# Patient Record
Sex: Female | Born: 1975 | Race: White | Hispanic: Yes | Marital: Married | Smoking: Never smoker
Health system: Southern US, Community
[De-identification: ages and names within clinical notes are randomized; demographics above are authoritative.]

## PROBLEM LIST (undated history)

## (undated) DIAGNOSIS — I10 Essential (primary) hypertension: Secondary | ICD-10-CM

## (undated) DIAGNOSIS — G4733 Obstructive sleep apnea (adult) (pediatric): Secondary | ICD-10-CM

## (undated) DIAGNOSIS — K219 Gastro-esophageal reflux disease without esophagitis: Secondary | ICD-10-CM

## (undated) DIAGNOSIS — K811 Chronic cholecystitis: Secondary | ICD-10-CM

## (undated) DIAGNOSIS — R102 Pelvic and perineal pain: Secondary | ICD-10-CM

## (undated) DIAGNOSIS — R112 Nausea with vomiting, unspecified: Secondary | ICD-10-CM

## (undated) DIAGNOSIS — Z87442 Personal history of urinary calculi: Secondary | ICD-10-CM

## (undated) DIAGNOSIS — F419 Anxiety disorder, unspecified: Secondary | ICD-10-CM

## (undated) DIAGNOSIS — Z9889 Other specified postprocedural states: Secondary | ICD-10-CM

## (undated) DIAGNOSIS — Z973 Presence of spectacles and contact lenses: Secondary | ICD-10-CM

## (undated) DIAGNOSIS — R519 Headache, unspecified: Secondary | ICD-10-CM

## (undated) DIAGNOSIS — B009 Herpesviral infection, unspecified: Secondary | ICD-10-CM

## (undated) HISTORY — DX: Herpesviral infection, unspecified: B00.9

## (undated) HISTORY — PX: ESSURE TUBAL LIGATION: SUR464

## (undated) HISTORY — PX: WISDOM TOOTH EXTRACTION: SHX21

## (undated) HISTORY — PX: DILATION AND CURETTAGE OF UTERUS: SHX78

## (undated) HISTORY — PX: BREAST CYST EXCISION: SHX579

---

## 2001-09-03 ENCOUNTER — Ambulatory Visit (HOSPITAL_COMMUNITY): Admission: RE | Admit: 2001-09-03 | Discharge: 2001-09-03 | Payer: Self-pay | Admitting: Obstetrics and Gynecology

## 2001-09-03 ENCOUNTER — Encounter: Payer: Self-pay | Admitting: Obstetrics and Gynecology

## 2001-10-31 ENCOUNTER — Inpatient Hospital Stay (HOSPITAL_COMMUNITY): Admission: AD | Admit: 2001-10-31 | Discharge: 2001-10-31 | Payer: Self-pay | Admitting: Obstetrics and Gynecology

## 2002-01-19 ENCOUNTER — Inpatient Hospital Stay (HOSPITAL_COMMUNITY): Admission: AD | Admit: 2002-01-19 | Discharge: 2002-01-19 | Payer: Self-pay | Admitting: Obstetrics and Gynecology

## 2002-01-28 ENCOUNTER — Inpatient Hospital Stay (HOSPITAL_COMMUNITY): Admission: AD | Admit: 2002-01-28 | Discharge: 2002-01-28 | Payer: Self-pay | Admitting: Obstetrics and Gynecology

## 2002-01-29 ENCOUNTER — Inpatient Hospital Stay (HOSPITAL_COMMUNITY): Admission: AD | Admit: 2002-01-29 | Discharge: 2002-01-31 | Payer: Self-pay | Admitting: Obstetrics and Gynecology

## 2002-03-14 ENCOUNTER — Other Ambulatory Visit: Admission: RE | Admit: 2002-03-14 | Discharge: 2002-03-14 | Payer: Self-pay | Admitting: Obstetrics and Gynecology

## 2004-03-25 ENCOUNTER — Other Ambulatory Visit: Admission: RE | Admit: 2004-03-25 | Discharge: 2004-03-25 | Payer: Self-pay | Admitting: Internal Medicine

## 2005-03-31 ENCOUNTER — Other Ambulatory Visit: Admission: RE | Admit: 2005-03-31 | Discharge: 2005-03-31 | Payer: Self-pay | Admitting: Internal Medicine

## 2006-10-25 ENCOUNTER — Inpatient Hospital Stay (HOSPITAL_COMMUNITY): Admission: AD | Admit: 2006-10-25 | Discharge: 2006-10-27 | Payer: Self-pay | Admitting: Obstetrics and Gynecology

## 2010-02-23 ENCOUNTER — Inpatient Hospital Stay (HOSPITAL_COMMUNITY): Admission: AD | Admit: 2010-02-23 | Discharge: 2010-02-25 | Payer: Self-pay | Admitting: Obstetrics and Gynecology

## 2010-06-22 LAB — COMPREHENSIVE METABOLIC PANEL
ALT: 11 U/L (ref 0–35)
AST: 24 U/L (ref 0–37)
Albumin: 2.9 g/dL — ABNORMAL LOW (ref 3.5–5.2)
Alkaline Phosphatase: 228 U/L — ABNORMAL HIGH (ref 39–117)
BUN: 9 mg/dL (ref 6–23)
CO2: 19 mEq/L (ref 19–32)
Calcium: 8.6 mg/dL (ref 8.4–10.5)
Chloride: 108 mEq/L (ref 96–112)
Creatinine, Ser: 0.6 mg/dL (ref 0.4–1.2)
GFR calc Af Amer: >60 mL/min (ref >60)
GFR calc non Af Amer: >60 mL/min (ref >60)
Glucose, Bld: 85 mg/dL (ref 70–99)
Potassium: 4.2 mEq/L (ref 3.5–5.1)
Sodium: 137 mEq/L (ref 135–145)
Total Bilirubin: 0.4 mg/dL (ref 0.3–1.2)
Total Protein: 6.4 g/dL (ref 6.0–8.3)

## 2010-06-22 LAB — CBC
HCT: 37.5 % (ref 36.0–46.0)
HCT: 38.7 % (ref 36.0–46.0)
Hemoglobin: 11.1 g/dL — ABNORMAL LOW (ref 12.0–15.0)
Hemoglobin: 12.9 g/dL (ref 12.0–15.0)
MCH: 32.8 pg (ref 26.0–34.0)
MCH: 32.9 pg (ref 26.0–34.0)
MCH: 33.2 pg (ref 26.0–34.0)
MCHC: 34.4 g/dL (ref 30.0–36.0)
MCHC: 34.7 g/dL (ref 30.0–36.0)
MCV: 95.2 fL (ref 78.0–100.0)
MCV: 95.2 fL (ref 78.0–100.0)
MCV: 95.6 fL (ref 78.0–100.0)
Platelets: 112 10*3/uL — ABNORMAL LOW (ref 150–400)
Platelets: 122 10*3/uL — ABNORMAL LOW (ref 150–400)
RBC: 3.34 MIL/uL — ABNORMAL LOW (ref 3.87–5.11)
RBC: 3.94 MIL/uL (ref 3.87–5.11)
RDW: 13.5 % (ref 11.5–15.5)
RDW: 13.6 % (ref 11.5–15.5)
WBC: 8.7 10*3/uL (ref 4.0–10.5)
WBC: 8.9 10*3/uL (ref 4.0–10.5)

## 2010-06-22 LAB — URINE MICROSCOPIC-ADD ON

## 2010-06-22 LAB — URINALYSIS, ROUTINE W REFLEX MICROSCOPIC
Bilirubin Urine: NEGATIVE
Glucose, UA: NEGATIVE mg/dL
Ketones, ur: 15 mg/dL — AB
Leukocytes, UA: NEGATIVE
Nitrite: NEGATIVE
Protein, ur: NEGATIVE mg/dL
Specific Gravity, Urine: 1.01 (ref 1.005–1.030)
Urobilinogen, UA: 0.2 mg/dL (ref 0.0–1.0)
pH: 6 (ref 5.0–8.0)

## 2010-06-22 LAB — URIC ACID: Uric Acid, Serum: 6.4 mg/dL (ref 2.4–7.0)

## 2010-07-15 ENCOUNTER — Other Ambulatory Visit (HOSPITAL_COMMUNITY): Payer: Self-pay | Admitting: Obstetrics and Gynecology

## 2010-07-15 DIAGNOSIS — N971 Female infertility of tubal origin: Secondary | ICD-10-CM

## 2010-07-21 ENCOUNTER — Ambulatory Visit (HOSPITAL_COMMUNITY)
Admission: RE | Admit: 2010-07-21 | Discharge: 2010-07-21 | Disposition: A | Discharge status: Home / Self Care | Payer: 59 | Source: Ambulatory Visit | Attending: Obstetrics and Gynecology | Admitting: Obstetrics and Gynecology

## 2010-07-21 DIAGNOSIS — Z3049 Encounter for surveillance of other contraceptives: Secondary | ICD-10-CM | POA: Insufficient documentation

## 2010-07-21 DIAGNOSIS — N971 Female infertility of tubal origin: Secondary | ICD-10-CM

## 2010-08-24 NOTE — Discharge Summary (Signed)
Emily Nolan, ASSAD NO.:  [id number]   MEDICAL RECORD NO.:  [id number]          PATIENT TYPE:  INP   LOCATION:  9105                          FACILITY:  [facility]   PHYSICIAN:  Malachi Pro. Ambrose Mantle, M.D. DATE OF BIRTH:  02/15/1976   DATE OF ADMISSION:  10/25/2006  DATE OF DISCHARGE:  10/27/2006                               DISCHARGE SUMMARY   This is a 35 year old Hispanic female para 1-0-2-1 gravida 4, EDC  10/29/2006 admitted with contractions and rupture of membranes at 10:45  a.m.  Blood group and type B+, negative antibody, nonreactive serology,  rubella equivocal, hepatitis B surface antigen negative, HIV negative,  GC and chlamydia negative.  1-hour Glucola 87.  Group B strep negative.  First trimester screen negative, AFP negative.  Vaginal ultrasound on  03/23/2006 crown-rump length 2.04 cm, 8 weeks 4 days, [facility] 10/29/2006.  Ultrasound on 06/01/2006 normal anatomy appropriate growth.  The patient  was on Valtrex for herpes suppression, nonstress test for size less than  dates reactive.  On the day of admission the patient complained of  contractions and on exam in my office the cervix was 3 cm, 25% vertex at  a -4 and rupture of membranes was noted.   PAST MEDICAL HISTORY:  No known allergies.   OPERATIONS:  D&C.   ILLNESSES:  Herpes.   SOCIAL HISTORY:  Alcohol, tobacco and drugs none.   FAMILY HISTORY:  Father with high blood pressure, cancer of the tongue.  The patient also had 2 units of blood transfusion at the time of the  [facility].   PHYSICAL EXAMINATION:  On admission her vital signs were normal.  Initial blood pressure was a little elevated but it subsequently became  normal.  HEART/LUNGS:  Were normal.  ABDOMEN was soft, fundal height 36 cm.  Fetal heart tones normal.  Cervix 4 cm, 90%.   The patient requested an epidural.  After the epidural the cervix was 9  cm.  She rapidly became fully dilated and pushed well.  She delivered  spontaneously OA  over a second-degree midline laceration a living female  infant 7 pounds 3 ounces, Apgars of 8 at 1 and  9 at 5 minutes.  Placenta was intact.  The uterus was normal.  Second-degree midline  laceration repaired with 3-0 Vicryl under local block.  Blood loss about  400 mL.  Dr. Ambrose Mantle was in attendance.  At 8:13 p.m. I examined the  patient because the nurse had noted heavier than normal bleeding. I did  evacuate 50-75 mL of clot from the uterus and she subsequently had no  significant bleeding.  On the second postpartum day she was afebrile,  tolerating a regular diet, passing flatus, ambulating well and was ready  for discharge.  Hemoglobin on admission 13.3, hematocrit 38.5, white  count 10,000, platelet count 156,000.  Follow-up hemoglobin 11.5,  platelet count 125,000, RPR nonreactive.   FINAL DIAGNOSES:  Intrauterine pregnancy at 39+ weeks.  Delivered OA,  operation spontaneous delivery OA, repair of second-degree midline  laceration.   FINAL CONDITION:  Improved.  Instructions include our regular discharge  instructions.  The patient is given a prescription for Percocet 5/325 20  tablets one every 4-6 hours as needed for pain, is asked to return in 6  weeks for follow-up examination.      Malachi Pro. Ambrose Mantle, M.D.  Electronically Signed     TFH/MEDQ  D:  10/27/2006  T:  10/27/2006  Job:  [id number]

## 2010-08-27 NOTE — Discharge Summary (Signed)
NAME:  Emily Nolan, Emily Nolan                         ACCOUNT NO.:  [id number]   MEDICAL RECORD NO.:  [id number]                   PATIENT TYPE:  INP   LOCATION:  9114                                 FACILITY:  [facility]   PHYSICIAN:  Zenaida Niece, M.D.             DATE OF BIRTH:  December 16, 1975   DATE OF ADMISSION:  01/29/2002  DATE OF DISCHARGE:  01/31/2002                                 DISCHARGE SUMMARY   ADMISSION DIAGNOSES:  1. Intrauterine pregnancy at 40 weeks.  2. Group B strep carrier.  3. History of genital herpes.   DISCHARGE DIAGNOSES:  1. Intrauterine pregnancy at 40 weeks.  2. Group B strep carrier.  3. History of genital herpes.   PROCEDURES:  Spontaneous vaginal delivery.   HISTORY:  This is a 35 year old Hispanic female gravida 3 para 0-0-2-0 with  an EGA of [redacted] weeks by an LMP consistent with an 18-week ultrasound with a  due date of January 28, 2002 who presented with a complaint of regular  contractions without bleeding or ruptured membranes, and in maternity  admissions her cervix was 5-6 cm dilated.  Prenatal care transferred to [place]  at approximately 16 weeks.  She was put on Valtrex in the third trimester  for suppression.   PRENATAL LABORATORY DATA:  Blood type B positive with a negative antibody  screen.  Rubella immune.  Hepatitis B surface antigen negative.  HIV  negative.  Hepatitis C antibodies negative.  RPR nonreactive.  Gonorrhea and  chlamydia negative.  One-hour Glucola 111.  Triple screen normal.  Group B  strep is positive.   PAST GYNECOLOGICAL HISTORY:  Significant for history of genital herpes.   SURGICAL HISTORY:  D&C x2 at age 59 and required a blood transfusion during  a D&C for spontaneous abortion.   OBSTETRICAL HISTORY:  At age 71, eight week spontaneous abortion and in 1997  she had an elective abortion.   PHYSICAL EXAMINATION:  VITAL SIGNS:  Blood pressure 135/93, pulse 80.  ABDOMEN:  Soft with a fundal height of 38 cm.  Fetal heart  tracing is  normal.  PELVIC:  Cervix on admission was 5-6 cm dilated but on Dr. Ebony Hail first  exam she was 7-8, complete, -1, with a vertex presentation and no visible  herpetic lesions.  NEUROLOGIC:  DTRs were 3+.   HOSPITAL COURSE:  The patient was admitted in active labor and started on  penicillin for group B strep prophylaxis.  She continued to progress and Dr.  Ambrose Mantle performed an amniotomy which revealed clear fluid.  She then  progressed to complete and pushed well.  On the morning of January 29, 2002  she had a vaginal delivery of a viable female infant with Apgars of 9 and 9  that weighed 7 pounds 5 ounces.  Placenta delivered spontaneous and was  intact.  Perineum had a small second degree laceration which was repaired  with 2-0  Vicryl. Estimated blood loss was less than 500 cc.  Blood pressure  was watched closely after delivery and rapidly normalized.  She did have one  episode of increased bleeding which was treated with one dose of p.o.  Methergine.  She breast fed her baby without complications.  Predelivery  hemoglobin was 14.8, postdelivery 11.7.  On the morning of postpartum day #2  she was stable for discharge home.   DIET:  Regular diet.   ACTIVITY:  Pelvic rest.   FOLLOW-UP:  In four to six weeks.   MEDICATIONS:  1. Darvocet one to two p.o. q.4-6h. p.r.n. pain #20.  2. Over-the-counter Motrin or Aleve p.r.n.   DISCHARGE INSTRUCTIONS:  She was given our discharge pamphlet.                                                Zenaida Niece, M.D.    TDM/MEDQ  D:  01/31/2002  T:  01/31/2002  Job:  [id number]

## 2011-01-24 LAB — RPR: RPR Ser Ql: NONREACTIVE

## 2011-01-24 LAB — CBC
HCT: 38.5
MCHC: 34.5
MCV: 96.6
Platelets: 156
RDW: 13.2
RDW: 13.4

## 2011-01-24 LAB — CCBB MATERNAL DONOR DRAW

## 2011-04-12 LAB — HM PAP SMEAR

## 2013-01-02 ENCOUNTER — Encounter: Payer: Self-pay | Admitting: *Deleted

## 2013-01-07 ENCOUNTER — Encounter: Payer: Self-pay | Admitting: Internal Medicine

## 2013-01-07 ENCOUNTER — Ambulatory Visit (INDEPENDENT_AMBULATORY_CARE_PROVIDER_SITE_OTHER): Payer: 59 | Admitting: Internal Medicine

## 2013-01-07 VITALS — BP 122/84 | HR 82 | Ht 63.0 in | Wt 167.0 lb

## 2013-01-07 DIAGNOSIS — R42 Dizziness and giddiness: Secondary | ICD-10-CM

## 2013-01-07 DIAGNOSIS — R002 Palpitations: Secondary | ICD-10-CM

## 2013-01-07 NOTE — Progress Notes (Signed)
HPI Patient is a 37 yo who is referred for spells.  Spells began in late June  One day after Lunch  Felt dizzy  Blurred vision  Tightness in chest.  Awhile before felt OK  Hadn't eaten much Had a few more times  Sporadic.  One time lying with son after dinner Felt heart race  Sat up  Felt a little better  When not having spells feeling OK but more tired Has 3 kids  (3, 6,11) Work as Interior and spatial designer of [employer] No Known Allergies  Current Outpatient Prescriptions  Medication Sig Dispense Refill  . ValACYclovir HCl (VALTREX PO) Take by mouth as needed.        No current facility-administered medications for this visit.    Past Medical History  Diagnosis Date  . HSV-2 infection   . Hematoma     with GU work up partial    Past Surgical History  Procedure Laterality Date  . Colonscopy    . Tubligation      Family History  Problem Relation Age of Onset  . Heart disease Mother   . Hypertension Father   . Heart disease Father   . Alcohol abuse Father   . Hyperlipidemia Father   . Stroke Paternal Uncle   . Diabetes Paternal Uncle   . Heart disease Maternal Grandmother     History   Social History  . Marital Status: Married    Spouse Name: N/A    Number of Children: N/A  . Years of Education: N/A   Occupational History  . Not on file.   Social History Main Topics  . Smoking status: Never Smoker   . Smokeless tobacco: Not on file  . Alcohol Use: Yes     Comment: Rare  . Drug Use: No  . Sexual Activity: Not on file   Other Topics Concern  . Not on file   Social History Narrative  . No narrative on file    Review of Systems:  All systems reviewed.  They are negative to the above problem except as previously stated.  Vital Signs: BP 122/84  Pulse 82  Ht 5\' 3"  (1.6 m)  Wt 167 lb (75.751 kg)  BMI 29.59 kg/m2  Physical Exam Patient is in NAD HEENT:  Normocephalic, atraumatic. EOMI, PERRLA.  Neck: JVP is normal.  No bruits.  Lungs: clear to auscultation.  No rales no wheezes.  Heart: Regular rate and rhythm. Normal S1, S2. No S3.   No significant murmurs. PMI not displaced.  Abdomen:  Supple, nontender. Normal bowel sounds. No masses. No hepatomegaly.  Extremities:   Good distal pulses throughout. No lower extremity edema.  Musculoskeletal :moving all extremities.  Neuro:   alert and oriented x3.  CN II-XII grossly intact.  EKG  SR 82 bpm.     Assessment and Plan:  1.  Spells.  History is difficult.  She is not orthostatic on exam.  There are some palpitatoins  Not clear if precede the spells of dizziness I encouraged her to stay  Hydrated, eat extra salt.   I would set her up for an event monitor to evalaute for arrhythmia. Encouraged her to follow symptoms  If dizzy, don't get up.   Stay active  Keep muscles toned.

## 2013-01-07 NOTE — Patient Instructions (Addendum)
Your physician has recommended that you wear an event monitor. Event monitors are medical devices that record the heart's electrical activity. Doctors most often [place] these monitors to diagnose arrhythmias. Arrhythmias are problems with the speed or rhythm of the heartbeat. The monitor is a small, portable device. You can wear one while you do your normal daily activities. This is usually used to diagnose what is causing palpitations/syncope (passing out).  Your physician recommends that you schedule a follow-up appointment in: will be determined.

## 2013-01-10 ENCOUNTER — Encounter (INDEPENDENT_AMBULATORY_CARE_PROVIDER_SITE_OTHER): Payer: 59

## 2013-01-10 ENCOUNTER — Encounter: Payer: Self-pay | Admitting: Radiology

## 2013-01-10 DIAGNOSIS — R002 Palpitations: Secondary | ICD-10-CM

## 2013-01-10 DIAGNOSIS — R42 Dizziness and giddiness: Secondary | ICD-10-CM

## 2013-01-10 NOTE — Progress Notes (Signed)
Patient ID: Emily Nolan, female   DOB: [birth year], 37 y.o.   MRN: [id number] E Cardio Verite 30 day monitor

## 2013-01-19 ENCOUNTER — Encounter (HOSPITAL_COMMUNITY): Payer: Self-pay | Admitting: Emergency Medicine

## 2013-01-19 ENCOUNTER — Emergency Department (HOSPITAL_COMMUNITY)
Admission: EM | Admit: 2013-01-19 | Discharge: 2013-01-19 | Disposition: A | Discharge status: Home / Self Care | Payer: 59 | Attending: Emergency Medicine | Admitting: Emergency Medicine

## 2013-01-19 ENCOUNTER — Emergency Department (HOSPITAL_COMMUNITY): Payer: 59

## 2013-01-19 DIAGNOSIS — Z8619 Personal history of other infectious and parasitic diseases: Secondary | ICD-10-CM | POA: Insufficient documentation

## 2013-01-19 DIAGNOSIS — IMO0002 Reserved for concepts with insufficient information to code with codable children: Secondary | ICD-10-CM | POA: Insufficient documentation

## 2013-01-19 DIAGNOSIS — Y939 Activity, unspecified: Secondary | ICD-10-CM | POA: Insufficient documentation

## 2013-01-19 DIAGNOSIS — S7010XA Contusion of unspecified thigh, initial encounter: Secondary | ICD-10-CM | POA: Insufficient documentation

## 2013-01-19 DIAGNOSIS — W010XXA Fall on same level from slipping, tripping and stumbling without subsequent striking against object, initial encounter: Secondary | ICD-10-CM | POA: Insufficient documentation

## 2013-01-19 DIAGNOSIS — Y9269 Other specified industrial and construction area as the place of occurrence of the external cause: Secondary | ICD-10-CM | POA: Insufficient documentation

## 2013-01-19 DIAGNOSIS — S7001XA Contusion of right hip, initial encounter: Secondary | ICD-10-CM

## 2013-01-19 DIAGNOSIS — W19XXXA Unspecified fall, initial encounter: Secondary | ICD-10-CM

## 2013-01-19 MED ORDER — OXYCODONE-ACETAMINOPHEN 5-325 MG PO TABS
2.0000 | ORAL_TABLET | Freq: Once | ORAL | Status: AC
  Administered 2013-01-19: 2 via ORAL
  Filled 2013-01-19: qty 2

## 2013-01-19 MED ORDER — OXYCODONE-ACETAMINOPHEN 5-325 MG PO TABS: 1.0000 | ORAL_TABLET | Freq: Four times a day (QID) | ORAL | Status: DC | PRN

## 2013-01-19 NOTE — ED Notes (Signed)
Pt states that she slipped in her garage around 5:30pm tonight due to wet flip flops; pt states that she fell on her right side; c/o rt hip/ buttock pain; painful when ambulates; pt states pain is increased when weight is put on leg.

## 2013-01-19 NOTE — ED Provider Notes (Signed)
CSN: [id number]     Arrival date & time 01/19/13  1947 History  This chart was scribed for non-physician practitioner, Earley Favor, FNP,working with Vida Roller, MD, by Karle Plumber, ED Scribe.  This patient was seen in room WTR5/WTR5 and the patient's care was started at 9:32 PM.    Chief Complaint  Patient presents with  . Hip Pain   The history is provided by the patient. No language interpreter was used.   HPI Comments:  Emily Nolan is a 37 y.o. female who presents to the Emergency Department complaining of right hip and buttock pain upon falling in her garage onset 4 hours ago. She reports associated lower back pain. Pt states she took Ibuprofen for pain with mild relief. Pt denies any knee or ankle pain. There is no obvious bruising. She denies any urinary hesitancy or incontinence. She denies any other symptoms or injuries.   Past Medical History  Diagnosis Date  . HSV-2 infection   . Hematoma     with GU work up partial   Past Surgical History  Procedure Laterality Date  . Colonscopy    . Tubligation     Family History  Problem Relation Age of Onset  . Heart disease Mother   . Hypertension Father   . Heart disease Father   . Alcohol abuse Father   . Hyperlipidemia Father   . Stroke Paternal Uncle   . Diabetes Paternal Uncle   . Heart disease Maternal Grandmother    History  Substance Use Topics  . Smoking status: Never Smoker   . Smokeless tobacco: Not on file  . Alcohol Use: Yes     Comment: Rare   OB History   Grav Para Term Preterm Abortions TAB SAB Ect Mult Living                 Review of Systems  Musculoskeletal: Positive for gait problem and joint swelling. Negative for neck stiffness.  Neurological: Negative for weakness and headaches.  All other systems reviewed and are negative.    Allergies  Review of patient's allergies indicates no known allergies.  Home Medications   Current Outpatient Rx  Name  Route  Sig  Dispense   Refill  . ibuprofen (ADVIL,MOTRIN) 200 MG tablet   Oral   Take 200 mg by mouth every 6 (six) hours as needed for pain.         Marland Kitchen oxyCODONE-acetaminophen (PERCOCET/ROXICET) 5-325 MG per tablet   Oral   Take 1 tablet by mouth every 6 (six) hours as needed for pain.   21 tablet   0    Triage Vitals: BP 113/83  Pulse 79  Temp(Src) 98.9 F (37.2 C) (Oral)  Resp 20  Wt 160 lb (72.576 kg)  BMI 28.35 kg/m2  SpO2 100%  LMP 01/03/2013 Physical Exam  Nursing note and vitals reviewed. Constitutional: She appears well-nourished.  HENT:  Head: Normocephalic.  Eyes: Pupils are equal, round, and reactive to light.  Neck: Normal range of motion.  Cardiovascular: Normal rate and regular rhythm.   Pulmonary/Chest: Effort normal.  Musculoskeletal: Normal range of motion. She exhibits tenderness. She exhibits no edema.       Back:  Neurological: She is alert.  Skin: Skin is warm and dry.    ED Course  Procedures (including critical care time) DIAGNOSTIC STUDIES: Oxygen Saturation is 100% on RA, normal by my interpretation.   COORDINATION OF CARE: 9:37 PM- Will obtain an X-Ray of hip. Pt verbalizes  understanding and agrees to plan.  Medications  oxyCODONE-acetaminophen (PERCOCET/ROXICET) 5-325 MG per tablet 2 tablet (2 tablets Oral Given 01/19/13 2143)   Labs Review Labs Reviewed - No data to display Imaging Review Dg Hip Complete Right  01/19/2013   *RADIOLOGY REPORT*  Clinical Data: Status post fall onto right hip; right hip pain.  RIGHT HIP - COMPLETE 2+ VIEW  Comparison: None.  Findings: There is no evidence of fracture or dislocation.  Both femoral heads are seated normally within their respective acetabula.  The proximal right femur appears intact.  No significant degenerative change is appreciated.  The sacroiliac joints are unremarkable in appearance.  The visualized bowel gas pattern is grossly unremarkable in appearance.  Bilateral essure wires are noted.  IMPRESSION: No  evidence of fracture or dislocation.   Original Report Authenticated By: Tonia Ghent, M.D.    EKG Interpretation   None       MDM   1. Contusion, hip and thigh, right, initial encounter   2. Fall at home, initial encounter        I personally performed the services described in this documentation, which was scribed in my presence. The recorded information has been reviewed and is accurate.   Arman Filter, NP 01/19/13 2238

## 2013-01-20 NOTE — Progress Notes (Signed)
Received incoming call from Carney Bern husband of Mrs Dera shepphard.Mr Burnside reports his wife was prescribed Oxycodone medication following a visit to ED at [facility] ED Yesterday.His wife has been feeling nauseated and is hoping another medication can be called in.Patient demographics verified in EPIC.This Clinical research associate verified the patients information with NP  Freddy Jaksch NP/ Dr Elesa Massed. New orders received for oral Zofran.Contacted [facility] [address]- per Mr [employer] and new medication order called in to the Pharmacist  Henrine Screws.Carney Bern called and updated of above - Mr Vandivier will collect the new prescription.Mr Knouff thanked this Clinical research associate for her help today.

## 2013-01-20 NOTE — ED Provider Notes (Signed)
Medical screening examination/treatment/procedure(s) were performed by non-physician practitioner and as supervising physician I was immediately available for consultation/collaboration.    Vida Roller, MD 01/20/13 [phone]

## 2013-01-25 ENCOUNTER — Encounter: Payer: Self-pay | Admitting: Internal Medicine

## 2013-02-12 ENCOUNTER — Ambulatory Visit: Payer: Self-pay | Admitting: Emergency Medicine

## 2013-02-26 ENCOUNTER — Telehealth: Payer: Self-pay | Admitting: *Deleted

## 2013-02-26 NOTE — Telephone Encounter (Signed)
Left message for pt to call, monitor reviewed by dr Tenny Craw shows no sign arrhythmia. ? If she is still having dizzy spells?

## 2013-03-06 NOTE — Telephone Encounter (Signed)
Left message for pt to call.

## 2013-03-06 NOTE — Telephone Encounter (Signed)
Spoke with pt, aware of monitor results. Her dizziness has lessened.

## 2013-05-29 ENCOUNTER — Encounter: Payer: Self-pay | Admitting: Physician Assistant

## 2013-05-29 ENCOUNTER — Ambulatory Visit (INDEPENDENT_AMBULATORY_CARE_PROVIDER_SITE_OTHER): Payer: 59 | Admitting: Physician Assistant

## 2013-05-29 VITALS — BP 138/84 | HR 88 | Temp 98.2°F | Resp 16 | Ht 63.0 in | Wt 166.0 lb

## 2013-05-29 DIAGNOSIS — J0100 Acute maxillary sinusitis, unspecified: Secondary | ICD-10-CM

## 2013-05-29 MED ORDER — PROMETHAZINE-DM 6.25-15 MG/5ML PO SYRP: 5.0000 mL | ORAL_SOLUTION | Freq: Four times a day (QID) | ORAL | Status: DC | PRN

## 2013-05-29 MED ORDER — LEVOFLOXACIN 500 MG PO TABS: 500.0000 mg | ORAL_TABLET | Freq: Every day | ORAL | Status: DC

## 2013-05-29 MED ORDER — FLUCONAZOLE 150 MG PO TABS: 150.0000 mg | ORAL_TABLET | Freq: Every day | ORAL | Status: DC

## 2013-05-29 MED ORDER — PREDNISONE 20 MG PO TABS: ORAL_TABLET | ORAL | Status: DC

## 2013-05-29 NOTE — Patient Instructions (Signed)
Please take the prednisone to help decrease inflammation and therefore decrease symptoms. Take it it with food to avoid GI upset. It can cause increased energy but on the other hand it can make it hard to sleep at night so please take it in the morning.  It is not an antibiotic so you can stop it early if you are feeling better.  If you are diabetic it will increase your sugars.   The majority of colds are caused by viruses and do not require antibiotics. Please read the rest of this hand out to learn more about the common cold and what you can do to help yourself as well as help prevent the over use of antibiotics.   COMMON COLD SIGNS AND SYMPTOMS - The common cold usually causes nasal congestion, runny nose, and sneezing. A sore throat may be present on the first day but usually resolves quickly. If a cough occurs, it generally develops on about the fourth or fifth day of symptoms, typically when congestion and runny nose are resolving  COMMON COLD COMPLICATIONS - In most cases, colds do not cause serious illness or complications. Most colds last for three to seven days, although many people continue to have symptoms (coughing, sneezing, congestion) for up to two weeks.  One of the more common complications is sinusitis, which is usually caused by viruses and rarely (about 2 percent of the time) by bacteria. Having thick or yellow to green-colored nasal discharge does not mean that bacterial sinusitis has developed; discolored nasal discharge is a normal phase of the common cold.  Lower respiratory infections, such as pneumonia or bronchitis, may develop following a cold.  Infection of the middle ear, or otitis media, can accompany or follow a cold.  COMMON COLD TREATMENT - There is no specific treatment for the viruses that cause the common cold. Most treatments are aimed at relieving some of the symptoms of the cold, but do not shorten or cure the cold. Antibiotics are not useful for treating the  common cold; antibiotics are only used to treat illnesses caused by bacteria, not viruses. Unnecessary use of antibiotics for the treatment of the common cold can cause allergic reactions, diarrhea, or other gastrointestinal symptoms in some patients.  The symptoms of a cold will resolve over time, even without any treatment. People with underlying medical conditions and those who use other over-the-counter or prescription medications should speak with their healthcare provider or pharmacist to ensure that it is safe to use these treatments. The following are treatments that may reduce the symptoms caused by the common cold.  Nasal congestion - Decongestants are good for nasal congestion- if you feel very stuffy but no mucus is coming out, this is the medication that will help you the most.  Pseudoephedrine is a decongestant that can improve nasal congestion. Although a prescription is not required, drugstores in the [place] keep pseudoephedrine behind the counter, so it must be requested from a pharmacist. If you have a heart condition or high blood pressure please use Coricidin BPH instead.   Runny nose - Antihistamines such as diphenhydramine (Benadryl), certazine (Zyrtec) which are best taking at night because they can make you tired OR loratadine (Claritin),  fexafinadine (Allegra) help with a runny nose.   Nasal sprays such an oxymetazoline (Afrin and others) may also give temporary relief of nasal congestion. However, these sprays should never be used for more than two to three days; use for more than three days use can worsen congestion.    Nasocort is now over the counter and can help decrease a runny nose. Please stop the medication if you have blurry vision or nose bleeds.   Sore throat and headache - Sore throat and headache are best treated with a mild pain reliever such as acetaminophen (Tylenol) or a non-steroidal anti-inflammatory agent such as ibuprofen or naproxen (Motrin or Aleve).  These medications should be taken with food to prevent stomach problems. As well as gargling with warm water and salt.   Cough - Common cough medicine ingredients include guaifenesin and dextromethorphan; these are often combined with other medications in over-the-counter cold formulas. Often a cough is worse at night or first in the morning due to post nasal drip from you nose. You can try to sleep at an angle to decrease a cough.   Alternative treatments - Heated, humidified air can improve symptoms of nasal congestion and runny nose, and causes few to no side effects. A number of alternative products, including vitamin C, doubling up on your vitamin D and herbal products such as echinacea, may help. Certain products, such as nasal gels that contain zinc (eg, Zicam), have been associated with a permanent loss of smell.  Antibiotics - Antibiotics should not be used to treat an uncomplicated common cold. As noted above, colds are caused by viruses. Antibiotics treat bacterial, not viral infections. Some viruses that cause the common cold can also depress the immune system or cause swelling in the lining of the nose or airways; this can, in turn, lead to a bacterial infection. Often you need to give your body 7 days to fight off a common cold while treating the symptoms with the medications listed above. If after 7 days your symptoms are not improving, you are getting worse, you have shortness of breath, chest pain, a fever of over 103 you should seek medical help immediately.   PREVENTION IS THE BEST MEDICINE - Hand washing is an essential and highly effective way to prevent the spread of infection.  Alcohol-based hand rubs are a good alternative for disinfecting hands if a sink is not available.  Hands should be washed before preparing food and eating and after coughing, blowing the nose, or sneezing. While it is not always possible to limit contact with people who may be infected with a cold, touching  the eyes, nose, or mouth after direct contact should be avoided when possible. Sneezing/coughing into the sleeve of one's clothing (at the inner elbow) is another means of containing sprays of saliva and secretions and does not contaminate the hands.    What is the TMJ? The temporomandibular (tem-PUH-ro-man-DIB-yoo-ler) joint, or the TMJ, connects the upper and lower jawbones. This joint allows the jaw to open wide and move back and forth when you chew, talk, or yawn.There are also several muscles that help this joint move. There can be muscle tightness and pain in the muscle that can cause several symptoms.  What causes TMJ pain? There are many causes of TMJ pain. Repeated chewing (for example, chewing gum) and clenching your teeth can cause pain in the joint. Some TMJ pain has no obvious cause. What can I do to ease the pain? There are many things you can do to help your pain get better. When you have pain:  Eat soft foods and stay away from chewy foods (for example, taffy) Try to use both sides of your mouth to chew Don't chew gum Don't open your mouth wide (for example, during yawning or singing) Don't bite your  cheeks or fingernails Lower your amount of stress and worry Applying a warm, damp washcloth to the joint may help. Over-the-counter pain medicines such as ibuprofen (one brand: Advil) or acetaminophen (one brand: Tylenol) might also help. Do not use these medicines if you are allergic to them or if your doctor told you not to use them. How can I stop the pain from coming back? When your pain is better, you can do these exercises to make your muscles stronger and to keep the pain from coming back:  Resisted mouth opening: Place your thumb or two fingers under your chin and open your mouth slowly, pushing up lightly on your chin with your thumb. Hold for three to six seconds. Close your mouth slowly. Resisted mouth closing: Place your thumbs under your chin and your two index fingers  on the ridge between your mouth and the bottom of your chin. Push down lightly on your chin as you close your mouth. Tongue up: Slowly open and close your mouth while keeping the tongue touching the roof of the mouth. Side-to-side jaw movement: Place an object about one fourth of an inch thick (for example, two tongue depressors) between your front teeth. Slowly move your jaw from side to side. Increase the thickness of the object as the exercise becomes easier Forward jaw movement: Place an object about one fourth of an inch thick between your front teeth and move the bottom jaw forward so that the bottom teeth are in front of the top teeth. Increase the thickness of the object as the exercise becomes easier. These exercises should not be painful. If it hurts to do these exercises, stop doing them and talk to your family doctor.

## 2013-05-29 NOTE — Progress Notes (Signed)
   Subjective:    Patient ID: Emily Nolan, female    DOB: May 12, 1975, 38 y.o.   MRN: [id number]  Sinus Problem This is a new problem. Episode onset: 6-7 days. The problem has been gradually worsening since onset. There has been no fever. Associated symptoms include chills, congestion, coughing, sinus pressure, sneezing, a sore throat and swollen glands. Pertinent negatives include no diaphoresis, ear pain, headaches, hoarse voice, neck pain or shortness of breath. Past treatments include lying down (mucinex, cough syrup). The treatment provided no relief.    Review of Systems  Constitutional: Positive for chills. Negative for diaphoresis.  HENT: Positive for congestion, postnasal drip, sinus pressure, sneezing and sore throat. Negative for ear pain and hoarse voice.   Respiratory: Positive for cough. Negative for chest tightness, shortness of breath and wheezing.   Cardiovascular: Negative.   Gastrointestinal: Negative.   Genitourinary: Negative.   Musculoskeletal: Negative for neck pain.  Neurological: Negative for headaches.       Objective:   Physical Exam  Constitutional: She appears well-developed and well-nourished.  HENT:  Head: Normocephalic and atraumatic.  Right Ear: External ear normal.  Nose: Right sinus exhibits maxillary sinus tenderness. Right sinus exhibits no frontal sinus tenderness. Left sinus exhibits maxillary sinus tenderness. Left sinus exhibits no frontal sinus tenderness.  TMJ tenderness  Eyes: Conjunctivae and EOM are normal.  Neck: Normal range of motion. Neck supple.  Cardiovascular: Normal rate, regular rhythm, normal heart sounds and intact distal pulses.   Pulmonary/Chest: Effort normal and breath sounds normal. No respiratory distress. She has no wheezes.  Abdominal: Soft. Bowel sounds are normal.  Lymphadenopathy:    She has cervical adenopathy.  Skin: Skin is warm and dry.      Assessment & Plan:  Acute maxillary sinusitis - Plan:  levofloxacin (LEVAQUIN) 500 MG tablet, fluconazole (DIFLUCAN) 150 MG tablet, promethazine-dextromethorphan (PROMETHAZINE-DM) 6.25-15 MG/5ML syrup, predniSONE (DELTASONE) 20 MG tablet

## 2013-07-11 ENCOUNTER — Encounter: Payer: Self-pay | Admitting: Emergency Medicine

## 2013-07-11 ENCOUNTER — Ambulatory Visit (INDEPENDENT_AMBULATORY_CARE_PROVIDER_SITE_OTHER): Payer: 59 | Admitting: Emergency Medicine

## 2013-07-11 VITALS — BP 116/82 | HR 78 | Temp 98.6°F | Resp 16 | Ht 63.25 in | Wt 165.0 lb

## 2013-07-11 DIAGNOSIS — R5383 Other fatigue: Principal | ICD-10-CM

## 2013-07-11 DIAGNOSIS — R5381 Other malaise: Secondary | ICD-10-CM

## 2013-07-11 LAB — CBC WITH DIFFERENTIAL/PLATELET
BASOS PCT: 0 % (ref 0–1)
Basophils Absolute: 0 10*3/uL (ref 0.0–0.1)
EOS ABS: 0.2 10*3/uL (ref 0.0–0.7)
Eosinophils Relative: 2 % (ref 0–5)
HEMATOCRIT: 39.3 % (ref 36.0–46.0)
HEMOGLOBIN: 13.7 g/dL (ref 12.0–15.0)
LYMPHS ABS: 2.9 10*3/uL (ref 0.7–4.0)
Lymphocytes Relative: 34 % (ref 12–46)
MCH: 31.3 pg (ref 26.0–34.0)
MCHC: 34.9 g/dL (ref 30.0–36.0)
MCV: 89.7 fL (ref 78.0–100.0)
MONO ABS: 0.5 10*3/uL (ref 0.1–1.0)
MONOS PCT: 6 % (ref 3–12)
Neutro Abs: 5 10*3/uL (ref 1.7–7.7)
Neutrophils Relative %: 58 % (ref 43–77)
Platelets: 247 10*3/uL (ref 150–400)
RBC: 4.38 MIL/uL (ref 3.87–5.11)
RDW: 13.6 % (ref 11.5–15.5)
WBC: 8.6 10*3/uL (ref 4.0–10.5)

## 2013-07-11 MED ORDER — HYOSCYAMINE SULFATE 0.125 MG PO TABS: 0.1250 mg | ORAL_TABLET | ORAL | Status: DC | PRN

## 2013-07-11 MED ORDER — AMOXICILLIN 500 MG PO CAPS: 500.0000 mg | ORAL_CAPSULE | Freq: Three times a day (TID) | ORAL | Status: DC

## 2013-07-11 MED ORDER — ZOLPIDEM TARTRATE 5 MG PO TABS: 5.0000 mg | ORAL_TABLET | Freq: Every evening | ORAL | Status: DC | PRN

## 2013-07-11 MED ORDER — FLUCONAZOLE 150 MG PO TABS: 150.0000 mg | ORAL_TABLET | ORAL | Status: DC

## 2013-07-11 MED ORDER — CIPROFLOXACIN HCL 500 MG PO TABS: 500.0000 mg | ORAL_TABLET | Freq: Two times a day (BID) | ORAL | Status: AC

## 2013-07-11 MED ORDER — ALPRAZOLAM 0.25 MG PO TABS: 0.2500 mg | ORAL_TABLET | Freq: Two times a day (BID) | ORAL | Status: AC | PRN

## 2013-07-11 NOTE — Progress Notes (Signed)
   Subjective:    Patient ID: Emily Nolan, female    DOB: 1975-07-31, 38 y.o.   MRN: [id number]  HPI Comments: 38 yo female presents for upcoming trip [place]/ [place] concerns. She notes having difficulty with flying long distances. She occasionally has fatigue but resolves with rest. She notes she is very busy with kids and work and has not been sleeping as much as she should. She is eating reasonably but has not been exercising due to crazy schedule.      Review of Systems  Constitutional: Positive for fatigue.  All other systems reviewed and are negative.   BP 116/82  Pulse 78  Temp(Src) 98.6 F (37 C) (Temporal)  Resp 16  Ht 5' 3.25" (1.607 m)  Wt 165 lb (74.844 kg)  BMI 28.98 kg/m2  LMP 07/04/2013     Objective:   Physical Exam  Nursing note and vitals reviewed. Constitutional: She is oriented to person, place, and time. She appears well-developed and well-nourished.  HENT:  Head: Normocephalic and atraumatic.  Eyes: Conjunctivae are normal.  Neck: Normal range of motion.  Cardiovascular: Normal rate, regular rhythm, normal heart sounds and intact distal pulses.   Pulmonary/Chest: Effort normal and breath sounds normal.  Abdominal: Soft. Bowel sounds are normal. She exhibits no distension. There is no tenderness.  Musculoskeletal: Normal range of motion.  Neurological: She is alert and oriented to person, place, and time.  Skin: Skin is warm and dry.  Psychiatric: She has a normal mood and affect. Judgment normal.          Assessment & Plan:  1. Fatigue- check labs, increase activity and H2O  2. Advised consult with CDC/ health department to see if any immunizations needed. Prophylactic medicine given with instructions take only PRN/ AD Hyoscyamine 0.125, Cipro 500 mg, Amoxicillin 500 mg, Diflucan 150 mg, Ambien 5mg , Xanax 0.25. Push fluids with travel and ASA 81 mg day before, day of, day after flight.

## 2013-07-11 NOTE — Patient Instructions (Signed)
Traveling Outside the [place] Hyoscyamine- GI Upset/ Diarrhea Cipro- GI upset or UTI Amoxicillin- URI/ Skin infection Diflucan 150 mg- Yeast Ambien- Sleep XAnax-1/2 to whole for sleep or anxiety/ nerves (do not take with ambien or alcohol)   See your doctor at least 4 - 6 weeks before your trip. This allows time for immunizations to take effect. If it is less than 4 weeks before you leave, you should still see your caregiver. You might still benefit from shots or medicines and information about how to protect yourself while traveling.  Your caregiver will ask you where you intend to travel, how long you intend to stay, and whether you may visit rural areas. This determines what vaccinations should be considered. Know your travel schedule when you visit your caregiver.  Adolescents and children should seek guidance on their vaccination status from their caregiver. So should women who are breastfeeding or pregnant, and people with altered immunity (HIV/AIDS, diabetes). CDC RECOMMENDS THE FOLLOWING VACCINES (AS NEEDED BY AGE AND BY WORLD REGION):  Routine Vaccines: Be up to date on your routine vaccinations. Get boosters, if needed. These include diphtheria, tetanus, and pertussis (DPT), measles, mumps, and rubella (MMR), influenza (flu), and varicella (chickenpox). Also, meningococcal, if you are 62 to 36 years of age, and zoster (shingles) if over age 61. Possibly pneumococcal, if you are a smoker or have long-term (chronic) lung or heart disease.  Typhoid: If you are visiting low income or developing countries.  Yellow Fever: If traveling to an area where the disease is prevalent (endemic).  HPV: (Human Papilloma Virus), if you are 30 years old or younger and intend to be sexually active.  Rabies: If you might be exposed to wild or domestic animals. Pre-exposure rabies vaccine is urged for people doing more than short-term travel in countries where rabies is common (including [place]).  Polio:  A single one-time booster is advised for travel to [place] and [place].  Hepatitis A: This is routinely given to children beginning at age 41 years. It is often advised for most foreign travel, including [place].  Hepatitis B: This is given routinely to infants, children, and adolescents.  Meningococcal: This is advised for travel to developing countries, where risk is high. For example, parts of sub-Saharan [place] ("meningitis belt"). [place] requires vaccine for all pilgrims attending the Hajj (religious travel to [place]).  Malaria: A vaccine does not yet exist. Oral medicines can prevent the usual types of malaria and drug-resistant strains. The most common medicine prescribed is LARIAM (mefloquine). It is taken once weekly before, during, and after travel. Other drugs are also used for malaria prevention, including chloroquine and doxycycline.  Japanese B Encephalitis (JE): This is a moderately toxic vaccine. Use is generally limited to travelers to [place], who will have long rural exposure to mosquitoes, in areas with high likelihood of disease spreading (such as, [place] paddies). TO STAY HEALTHY, DO:  Wash hands often, with soap and water.  Drink only bottled or boiled water, or carbonated (bubbly) drinks in cans or bottles. Avoid tap water, fountain drinks, and ice cubes. If not possible, make water safer by BOTH filtering through an "absolute 1-micron or less" filter AND adding iodine tablets. Such filters are found in camping and outdoor supply stores.  When buying carbonated drinks or bottled water, always inspect the bottle seal. Make sure it has not been previously opened. This could mean it was refilled with unclean beverages or water. If you suspect a bottle seal has been tampered with,  return or discard it.  Eat only thoroughly cooked food from a reputable restaurant or food service provider, who routinely caters to foreign travelers. Only eat meat, fish, or shellfish that have  been thoroughly cooked. Otherwise, they can infect you and cause gastroenteritis.  Avoid foods that have been prepared and left standing at room temperature. These often support bacterial growth that can make you ill.  If you will be visiting an area where there is risk for malaria, take your malaria prevention medicine before, during, and after travel, as directed. (See your doctor for a prescription.)  Protect yourself from mosquito bites:  Pay special attention to mosquito protection between dusk and dawn. This is when malaria-carrying mosquitos are active.  Wear long-sleeved shirts, long pants, and hats.  Use insect repellants that contain DEET (diethylmethyltoluamide).  Read and follow the directions and precautions on the product label.  Apply insect repellent to all exposed skin.  Do not put repellent on wounds or broken skin.  Do not breathe in, swallow, or get DEET in your eyes. DEET is toxic if swallowed. If using a spray product, apply DEET to your face by spraying your hands and rubbing the product carefully over the face. Avoid your eyes and mouth.  Purchase a bed net impregnated (treated) with the insecticide permethrin or deltamethrin. Or, spray the bed net with one of these insecticides. This is not needed if you are staying in air-conditioned or well-screened housing.  DEET may be used on adults, children, and infants older than 6 months of age. Protect infants by using a carrier draped with mosquito netting, with an elastic edge for a tight fit.  Children under 32 years old should not apply insect repellent themselves. Do not apply to young children's hands or around their eyes and mouth.  If you are visiting areas where malaria occurs, read the malaria prevention recommendations on the CDC malaria website ([employer]). Your caregiver will guide you on the selection and use of an anti-malaria preventive medicine that you may need to take before, during, and  after your visit.  To prevent fungal and parasitic infections, keep feet clean and dry. Do not go barefoot.  Always use condoms to reduce the risk of HIV and other sexually transmitted diseases.  Try to travel in vehicles that have seat belts, whenever possible. If renting a vehicle, try to rent a larger one for added protection. Wear helmets whenever bicycling or motorcycling. Avoid alcohol when operating any vehicle, even a bicycle. Avoid overcrowded, over-weighted, or top heavy buses or mini-vans. Be aware that pedestrian patterns vary greatly by country. TO AVOID GETTING SICK:  Do not eat food purchased from street vendors.  Eat at restaurants that often cater to foreign travelers (leading hotels, hotel chains).  Do not drink beverages with ice.  Do not eat dairy products, unless you know they have been pasteurized.  Do not share needles with anyone.  Do not handle animals (especially monkeys, dogs, and cats). Avoid bites and serious diseases (including rabies and plague).  Do not swim in fresh water. Salt water is often safer. Avoid swimming pools that are not chlorinated.  Do not have unprotected sex. WHAT YOU NEED TO BRING WITH YOU:  Long-sleeved shirt, long pants, and a hat to wear outside. This is to prevent illnesses carried by insects (malaria, dengue, filariasis, leishmaniasis, onchocerciasis).  Contact information card, for use in urgent situations. This should list the names, addresses, and telephone numbers of family member(s) or contact(s) in your country,  your primary caregivers, important specialty home caregivers, area hospitals and clinics where you will travel, and your national consulate or embassy.  Purchase a pre-packaged travel health kit from a reputable source, or create one yourself. This should include a first aid kit and commonly needed medicines. ITEMS TO INCLUDE IN A TRAVEL HEALTH KIT:  Insect repellent containing DEET.  Bed nets impregnated with  permethrin. (Can be purchased in camping or [employer] supply stores. Overseas, permethrin or another insecticide, deltamethrin, may be purchased to treat bed nets and clothes.)  Flying-insect spray or mosquito coils, to help clear rooms of mosquitoes. The product should contain a pyrethroid insecticide. These insecticides quickly kill flying insects, including mosquitoes.  Over-the-counter anti-diarrhea medicine, to take if you have diarrhea.  Iodine tablets and water filters to purify water, if bottled water is not available.  Sunblock and sunglasses.  Antibacterial hand wipes or an alcohol-based hand sanitizer. Must contain at least 60% alcohol.  Extra pair of contacts or prescription glasses, or both, for people who wear corrective lenses.  Prescription medicines. Make sure you have enough to last during your trip, as well as a copy of the prescription(s).  Destination-related medicines, if applicable:  Anti-malaria medicines.  Medicine to prevent or treat high-altitude illness.  Pain or fever medicines (acetaminophen, aspirin, ibuprofen).  Stomach upset or diarrhea medicines:  Over-the-counter anti-diarrhea medicine (loperamide, bismuth subsalicylate).  Antibiotic for self-treatment of moderate to severe diarrhea.  Oral rehydration solution packets.  Mild laxative.  Antacid.  Items to treat throat and respiratory symptoms:  Antihistamine.  Decongestant, alone or combined with antihistamine.  Cough suppressant or expectorant (promotes the expulsion of mucus).  Throat lozenges.  Anti-motion sickness medicine.  Epinephrine auto-injector (such as an EpiPen), if you have a history of severe allergic reaction. Smaller dose packages are available for children.  Any medicines, prescription or over-the-counter, taken on a regular basis at home. For Basic First Aid  Disposable gloves (at least two pairs).  Adhesive bandages, multiple sizes.  Gauze.  Adhesive  tape.  Elastic bandage wrap for sprains and strains.  Antiseptic.  Cotton swabs.  Tweezers.  Scissors.  Antifungal and antibacterial ointments or creams.  1% hydrocortisone cream.  Anti-itch gel or cream, for insect bites and stings.  Aloe gel for sunburns.  Moleskin or molefoam for blisters.  Digital thermometer.  Saline eye drops.  First-aid quick reference card.  Commercial suture and syringe kits, to be used by a local caregiver. (These items will also require a letter from the prescribing physician, on official letterhead stationery.) Note that some of the above items are considered sharp. They will need to be packed in your checked luggage, not in a carry on.  AFTER YOU RETURN HOME: If you have visited a malaria risk area, continue taking your anti-malaria drug for 4 weeks (chloroquine, doxycycline, or mefloquine) or 7 days (atovaquone/proguanil) after leaving the risk area. Malaria is always a serious disease and may be a deadly illness. If you become ill with a fever or flu-like illness, while traveling or after you return home (for up to 1 year), you should seek immediate medical attention. Tell the caregiver your travel history. This information is courtesy of the Center for Disease Control (CDC).  Document Released: 03/09/2004 Document Revised: 06/20/2011 Document Reviewed: 01/26/2009 [facility] Patient Information 2014 [place].

## 2013-07-12 LAB — BASIC METABOLIC PANEL WITH GFR
BUN: 8 mg/dL (ref 6–23)
CHLORIDE: 101 meq/L (ref 96–112)
CO2: 26 meq/L (ref 19–32)
Calcium: 9 mg/dL (ref 8.4–10.5)
Creat: 0.54 mg/dL (ref 0.50–1.10)
GFR, Est African American: >89 mL/min
GFR, Est Non African American: >89 mL/min
GLUCOSE: 98 mg/dL (ref 70–99)
POTASSIUM: 3.7 meq/L (ref 3.5–5.3)
SODIUM: 136 meq/L (ref 135–145)

## 2013-07-12 LAB — TSH: TSH: 1.102 u[IU]/mL (ref 0.350–4.500)

## 2013-10-28 ENCOUNTER — Encounter: Payer: Self-pay | Admitting: Emergency Medicine

## 2013-11-11 ENCOUNTER — Encounter: Payer: Self-pay | Admitting: Emergency Medicine

## 2013-11-11 ENCOUNTER — Encounter: Payer: Self-pay | Admitting: Physician Assistant

## 2013-11-11 ENCOUNTER — Ambulatory Visit (INDEPENDENT_AMBULATORY_CARE_PROVIDER_SITE_OTHER): Payer: 59 | Admitting: Emergency Medicine

## 2013-11-11 VITALS — BP 118/68 | HR 66 | Temp 99.1°F | Resp 16 | Ht 63.23 in | Wt 171.0 lb

## 2013-11-11 DIAGNOSIS — R5381 Other malaise: Secondary | ICD-10-CM

## 2013-11-11 DIAGNOSIS — R7309 Other abnormal glucose: Secondary | ICD-10-CM

## 2013-11-11 DIAGNOSIS — Z111 Encounter for screening for respiratory tuberculosis: Secondary | ICD-10-CM

## 2013-11-11 DIAGNOSIS — Z0000 Encounter for general adult medical examination without abnormal findings: Secondary | ICD-10-CM

## 2013-11-11 DIAGNOSIS — I10 Essential (primary) hypertension: Secondary | ICD-10-CM

## 2013-11-11 DIAGNOSIS — E782 Mixed hyperlipidemia: Secondary | ICD-10-CM

## 2013-11-11 DIAGNOSIS — R5383 Other fatigue: Secondary | ICD-10-CM

## 2013-11-11 LAB — CBC WITH DIFFERENTIAL/PLATELET
BASOS ABS: 0 10*3/uL (ref 0.0–0.1)
BASOS PCT: 0 % (ref 0–1)
EOS ABS: 0.2 10*3/uL (ref 0.0–0.7)
Eosinophils Relative: 2 % (ref 0–5)
HCT: 39.3 % (ref 36.0–46.0)
Hemoglobin: 13.7 g/dL (ref 12.0–15.0)
Lymphocytes Relative: 34 % (ref 12–46)
Lymphs Abs: 3 10*3/uL (ref 0.7–4.0)
MCH: 31.8 pg (ref 26.0–34.0)
MCHC: 34.9 g/dL (ref 30.0–36.0)
MCV: 91.2 fL (ref 78.0–100.0)
Monocytes Absolute: 0.4 10*3/uL (ref 0.1–1.0)
Monocytes Relative: 5 % (ref 3–12)
NEUTROS ABS: 5.2 10*3/uL (ref 1.7–7.7)
NEUTROS PCT: 59 % (ref 43–77)
PLATELETS: 229 10*3/uL (ref 150–400)
RBC: 4.31 MIL/uL (ref 3.87–5.11)
RDW: 13.5 % (ref 11.5–15.5)
WBC: 8.8 10*3/uL (ref 4.0–10.5)

## 2013-11-11 NOTE — Progress Notes (Signed)
Subjective:    Patient ID: Emily Nolan, female    DOB: 08-02-1975, 38 y.o.   MRN: [id number]  HPI Comments: 38 yo female CPE. She notes constant fatigue despite sleeping for 9 hours straight.She has had sleep study 4-5 years ago with NEG apnea but broken cycles per patient. She has had ENT referral with need for possible surgical intervention for airway recommended but declined surgery. She rarely uses Ambien or Xanax.  She notes has has been having more discomfort with pre-menses and will f/u at GYN. She is up 5 # since last CPE. She has been exercising more routinely and trying to improve diet.    WBC             8.6   07/11/2013 HGB            13.7   07/11/2013 HCT            39.3   07/11/2013 PLT             247   07/11/2013 GLUCOSE          98   07/11/2013 ALT              11   02/23/2010 AST              24   02/23/2010 NA              136   07/11/2013 K               3.7   07/11/2013 CL              101   07/11/2013 CREATININE     0.54   07/11/2013 BUN               8   07/11/2013 CO2              26   07/11/2013 TSH           1.102   07/11/2013 T 137 LDL 64 A1C 5 D 37     Medication List       This list is accurate as of: 11/11/13  4:28 PM.  Always use your most recent med list.               ALPRAZolam 0.25 MG tablet  Commonly known as:  XANAX  Take 1 tablet (0.25 mg total) by mouth 2 (two) times daily as needed for anxiety.     hyoscyamine 0.125 MG tablet  Commonly known as:  LEVSIN, ANASPAZ  Take 1 tablet (0.125 mg total) by mouth every 4 (four) hours as needed.     valACYclovir 500 MG tablet  Commonly known as:  VALTREX  Take 500 mg by mouth daily as needed.     zolpidem 5 MG tablet  Commonly known as:  AMBIEN  Take 1 tablet (5 mg total) by mouth at bedtime as needed for sleep.       Allergies  Allergen Reactions  . Azithromycin Nausea Only   Past Medical History  Diagnosis Date  . HSV-2 infection   . Benign hematuria     with negative GU workup   Past  Surgical History  Procedure Laterality Date  . Colonscopy    . Tubligation     History  Substance Use Topics  . Smoking status: Never Smoker   . Smokeless tobacco: Not on file  . Alcohol Use: Yes     Comment: Rare  Family History  Problem Relation Age of Onset  . Heart disease Mother   . Hypertension Father   . Heart disease Father   . Alcohol abuse Father   . Hyperlipidemia Father   . Stroke Paternal Uncle   . Diabetes Paternal Uncle   . Heart disease Maternal Grandmother     MAINTENANCE: Colonoscopy:N/A Mammo:N/A BMD:N/A Pap/ Pelvic:2013 wnl [id number] Dentist:[facility] [place]: [id number]   Patient Care Team: Unk Pinto, MD as PCP - General (Internal Medicine) Rozetta Nunnery, MD as Consulting Physician (Otolaryngology) Cheri Fowler, MD as Consulting Physician (Obstetrics and Gynecology) Edward [place] Grant Fontana., MD as Attending Physician (Urology) Yvette Rack., MD as Consulting Physician (Orthopedic Surgery) Chapman, (EYE) Mottinger, (Dentist)  Review of Systems  Constitutional: Positive for fatigue.  All other systems reviewed and are negative.  BP 118/68  Pulse 66  Temp(Src) 99.1 F (37.3 C)  Resp 16  Ht 5' 3.23" (1.606 m)  Wt 171 lb (77.565 kg)  BMI 30.07 kg/m2  LMP 11/11/2013 TEMP 98.1 Oral    Objective:   Physical Exam  Nursing note and vitals reviewed. Constitutional: She is oriented to person, place, and time. She appears well-developed and well-nourished. No distress.  overweight  HENT:  Head: Normocephalic and atraumatic.  Right Ear: External ear normal.  Left Ear: External ear normal.  Nose: Nose normal.  Mouth/Throat: Oropharynx is clear and moist.  Eyes: Conjunctivae and EOM are normal. Pupils are equal, round, and reactive to light. Right eye exhibits no discharge. Left eye exhibits no discharge. No scleral icterus.  Neck: Normal range of motion. Neck supple. No JVD present. No tracheal deviation present. No  thyromegaly present.  Cardiovascular: Normal rate, regular rhythm, normal heart sounds and intact distal pulses.   Pulmonary/Chest: Effort normal and breath sounds normal.  Abdominal: Soft. Bowel sounds are normal. She exhibits no distension and no mass. There is no tenderness. There is no rebound and no guarding.  Genitourinary:  Def gyn  Musculoskeletal: Normal range of motion. She exhibits no edema and no tenderness.  Lymphadenopathy:    She has no cervical adenopathy.  Neurological: She is alert and oriented to person, place, and time. She has normal reflexes. No cranial nerve deficit. She exhibits normal muscle tone. Coordination normal.  Skin: Skin is warm and dry. No rash noted. No erythema. No pallor.  Psychiatric: She has a normal mood and affect. Her behavior is normal. Judgment and thought content normal.    EKG NSCSPT WNL     Assessment & Plan:  1. CPE- Update screening labs/ History/ Immunizations/ Testing as needed. Advised healthy diet, QD exercise, increase H20 and continue RX/ Vitamins AD.  2. Fatigue vs sleep disturbance (sleep study vs ent Refer) check labs, increase activity and H2O. If labs negative repeat sleep study, if study negative advise repeat ENT consultation for previously mentioned surgery

## 2013-11-11 NOTE — Patient Instructions (Signed)
Tuberculin Skin Test The PPD skin test is a method used to help with the diagnosis of a disease called tuberculosis (TB). HOW THE TEST IS DONE  The test site (usually the forearm) is cleansed. The PPD extract is then injected under the top layer of skin, causing a blister to form on the skin. The reaction will take 48 - 72 hours to develop. You must return to your health care provider within that time to have the area checked. This will determine whether you have had a significant reaction to the PPD test. A reaction is measured in millimeters of hard swelling (induration) at the site. PREPARATION FOR TEST  There is no special preparation for this test. People with a skin rash or other skin irritations on their arms may need to have the test performed at a different spot on the body. Tell your health care provider if you have ever had a positive PPD skin test. If so, you should not have a repeat PPD test. Tell your doctor if you have a medical condition or if you take certain drugs, such as steroids, that can affect your immune system. These situations may lead to inaccurate test results. NORMAL FINDINGS A negative reaction (no induration) or a level of hard swelling that falls below a certain cutoff may mean that a person has not been infected with the bacteria that cause TB. There are different cutoffs for children, people with HIV, and other risk groups. Unfortunately, this is not a perfect test, and up to 20% of people infected with tuberculosis may not have a reaction on the PPD skin test. In addition, certain conditions that affect the immune system (cancer, recent chemotherapy, late-stage AIDS) may cause a false-negative test result.  The reaction will take 48 - 72 hours to develop. You must return to your health care provider within that time to have the area checked. Follow your caregiver's instructions as to where and when to report for this to be done. Ranges for normal findings may vary  among different laboratories and hospitals. You should always check with your doctor after having lab work or other tests done to discuss the meaning of your test results and whether your values are considered within normal limits. WHAT ABNORMAL RESULTS MEAN  The results of the test depend on the size of the skin reaction and on the person being tested.  A small reaction (5 mm of hard swelling at the site) is considered to be positive in people who have HIV, who are taking steroid therapy, or who have been in close contact with a person who has active tuberculosis. Larger reactions (greater than or equal to 10 mm) are considered positive in people with diabetes or kidney failure, and in health care workers, among others. In people with no known risks for tuberculosis, a positive reaction requires 15 mm or more of hard swelling at the site. RISKS AND COMPLICATIONS There is a very small risk of severe redness and swelling of the arm in people who have had a previous positive PPD test and who have the test again. There also have been a few rare cases of this reaction in people who have not been tested before. CONSIDERATIONS  A positive skin test does not necessarily mean that a person has active tuberculosis. More tests will be done to check whether active disease is present. Many people who were born outside the [place] may have had a vaccine called "BCG," which can lead to a false-positive test   result. MEANING OF TEST  Your caregiver will go over the test results with you and discuss the importance and meaning of your results, as well as treatment options and the need for additional tests if necessary. OBTAINING THE TEST RESULTS It is your responsibility to obtain your test results. Ask the lab or department performing the test when and how you will get your results. Document Released: 01/05/2005 Document Revised: 06/20/2011 Document Reviewed: 07/05/2013 [facility] Patient Information 2015  [place], [place]. This information is not intended to replace advice given to you by your health care provider. Make sure you discuss any questions you have with your health care provider. Fatigue Fatigue is a feeling of tiredness, lack of energy, lack of motivation, or feeling tired all the time. Having enough rest, good nutrition, and reducing stress will normally reduce fatigue. Consult your caregiver if it persists. The nature of your fatigue will help your caregiver to find out its cause. The treatment is based on the cause.  CAUSES  There are many causes for fatigue. Most of the time, fatigue can be traced to one or more of your habits or routines. Most causes fit into one or more of three general areas. They are: Lifestyle problems  Sleep disturbances.  Overwork.  Physical exertion.  Unhealthy habits.  Poor eating habits or eating disorders.  Alcohol and/or drug use .  Lack of proper nutrition (malnutrition). Psychological problems  Stress and/or anxiety problems.  Depression.  Grief.  Boredom. Medical Problems or Conditions  Anemia.  Pregnancy.  Thyroid gland problems.  Recovery from major surgery.  Continuous pain.  Emphysema or asthma that is not well controlled  Allergic conditions.  Diabetes.  Infections (such as mononucleosis).  Obesity.  Sleep disorders, such as sleep apnea.  Heart failure or other heart-related problems.  Cancer.  Kidney disease.  Liver disease.  Effects of certain medicines such as antihistamines, cough and cold remedies, prescription pain medicines, heart and blood pressure medicines, drugs used for treatment of cancer, and some antidepressants. SYMPTOMS  The symptoms of fatigue include:   Lack of energy.  Lack of drive (motivation).  Drowsiness.  Feeling of indifference to the surroundings. DIAGNOSIS  The details of how you feel help guide your caregiver in finding out what is causing the fatigue. You will be  asked about your present and past health condition. It is important to review all medicines that you take, including prescription and non-prescription items. A thorough exam will be done. You will be questioned about your feelings, habits, and normal lifestyle. Your caregiver may suggest blood tests, urine tests, or other tests to look for common medical causes of fatigue.  TREATMENT  Fatigue is treated by correcting the underlying cause. For example, if you have continuous pain or depression, treating these causes will improve how you feel. Similarly, adjusting the dose of certain medicines will help in reducing fatigue.  HOME CARE INSTRUCTIONS   Try to get the required amount of good sleep every night.  Eat a healthy and nutritious diet, and drink enough water throughout the day.  Practice ways of relaxing (including yoga or meditation).  Exercise regularly.  Make plans to change situations that cause stress. Act on those plans so that stresses decrease over time. Keep your work and personal routine reasonable.  Avoid street drugs and minimize use of alcohol.  Start taking a daily multivitamin after consulting your caregiver. SEEK MEDICAL CARE IF:   You have persistent tiredness, which cannot be accounted for.  You have  fever.  You have unintentional weight loss.  You have headaches.  You have disturbed sleep throughout the night.  You are feeling sad.  You have constipation.  You have dry skin.  You have gained weight.  You are taking any new or different medicines that you suspect are causing fatigue.  You are unable to sleep at night.  You develop any unusual swelling of your legs or other parts of your body. SEEK IMMEDIATE MEDICAL CARE IF:   You are feeling confused.  Your vision is blurred.  You feel faint or pass out.  You develop severe headache.  You develop severe abdominal, pelvic, or back pain.  You develop chest pain, shortness of breath, or an  irregular or fast heartbeat.  You are unable to pass a normal amount of urine.  You develop abnormal bleeding such as bleeding from the rectum or you vomit blood.  You have thoughts about harming yourself or committing suicide.  You are worried that you might harm someone else. MAKE SURE YOU:   Understand these instructions.  Will watch your condition.  Will get help right away if you are not doing well or get worse. Document Released: 01/23/2007 Document Revised: 06/20/2011 Document Reviewed: 07/30/2013 [facility] Patient Information 2015 [place], [place]. This information is not intended to replace advice given to you by your health care provider. Make sure you discuss any questions you have with your health care provider.

## 2013-11-12 LAB — BASIC METABOLIC PANEL WITH GFR
BUN: 9 mg/dL (ref 6–23)
CALCIUM: 8.9 mg/dL (ref 8.4–10.5)
CO2: 25 mEq/L (ref 19–32)
Chloride: 102 mEq/L (ref 96–112)
Creat: 0.52 mg/dL (ref 0.50–1.10)
GFR, Est Non African American: >89 mL/min
GLUCOSE: 91 mg/dL (ref 70–99)
POTASSIUM: 3.7 meq/L (ref 3.5–5.3)
SODIUM: 138 meq/L (ref 135–145)

## 2013-11-12 LAB — HEMOGLOBIN A1C
HEMOGLOBIN A1C: 5.3 % (ref <5.7)
MEAN PLASMA GLUCOSE: 105 mg/dL (ref <117)

## 2013-11-12 LAB — URINALYSIS, ROUTINE W REFLEX MICROSCOPIC
BILIRUBIN URINE: NEGATIVE
Glucose, UA: NEGATIVE mg/dL
KETONES UR: NEGATIVE mg/dL
Leukocytes, UA: NEGATIVE
NITRITE: NEGATIVE
Protein, ur: NEGATIVE mg/dL
Specific Gravity, Urine: 1.018 (ref 1.005–1.030)
UROBILINOGEN UA: 0.2 mg/dL (ref 0.0–1.0)
pH: 5.5 (ref 5.0–8.0)

## 2013-11-12 LAB — MICROALBUMIN / CREATININE URINE RATIO
CREATININE, URINE: 85.3 mg/dL
Microalb Creat Ratio: 10.4 mg/g (ref 0.0–30.0)
Microalb, Ur: 0.89 mg/dL (ref 0.00–1.89)

## 2013-11-12 LAB — URINALYSIS, MICROSCOPIC ONLY
Bacteria, UA: NONE SEEN
Casts: NONE SEEN
Crystals: NONE SEEN

## 2013-11-12 LAB — IRON AND TIBC
%SAT: 19 % — ABNORMAL LOW (ref 20–55)
IRON: 65 ug/dL (ref 42–145)
TIBC: 334 ug/dL (ref 250–470)
UIBC: 269 ug/dL (ref 125–400)

## 2013-11-12 LAB — HEPATIC FUNCTION PANEL
ALBUMIN: 4.4 g/dL (ref 3.5–5.2)
ALK PHOS: 51 U/L (ref 39–117)
ALT: 14 U/L (ref 0–35)
AST: 15 U/L (ref 0–37)
BILIRUBIN INDIRECT: 0.2 mg/dL (ref 0.2–1.2)
BILIRUBIN TOTAL: 0.3 mg/dL (ref 0.2–1.2)
Bilirubin, Direct: 0.1 mg/dL (ref 0.0–0.3)
TOTAL PROTEIN: 7.4 g/dL (ref 6.0–8.3)

## 2013-11-12 LAB — MAGNESIUM: Magnesium: 1.9 mg/dL (ref 1.5–2.5)

## 2013-11-12 LAB — INSULIN, FASTING: INSULIN FASTING, SERUM: 21 u[IU]/mL (ref 3–28)

## 2013-11-12 LAB — LIPID PANEL
CHOL/HDL RATIO: 3 ratio
Cholesterol: 142 mg/dL (ref 0–200)
HDL: 48 mg/dL (ref >39)
LDL CALC: 48 mg/dL (ref 0–99)
Triglycerides: 228 mg/dL — ABNORMAL HIGH (ref <150)
VLDL: 46 mg/dL — ABNORMAL HIGH (ref 0–40)

## 2013-11-12 LAB — FOLATE RBC: RBC Folate: 589 ng/mL (ref >280)

## 2013-11-12 LAB — VITAMIN D 25 HYDROXY (VIT D DEFICIENCY, FRACTURES): VIT D 25 HYDROXY: 36 ng/mL (ref 30–89)

## 2013-11-12 LAB — VITAMIN B12: Vitamin B-12: 352 pg/mL (ref 211–911)

## 2013-11-12 LAB — TSH: TSH: 0.997 u[IU]/mL (ref 0.350–4.500)

## 2013-11-14 LAB — TB SKIN TEST
Induration: 0 mm
TB SKIN TEST: NEGATIVE

## 2014-03-19 ENCOUNTER — Ambulatory Visit: Payer: Self-pay | Admitting: Physician Assistant

## 2014-04-16 ENCOUNTER — Ambulatory Visit: Payer: Self-pay | Admitting: Emergency Medicine

## 2014-05-19 ENCOUNTER — Ambulatory Visit (INDEPENDENT_AMBULATORY_CARE_PROVIDER_SITE_OTHER): Payer: 59 | Admitting: Emergency Medicine

## 2014-05-19 ENCOUNTER — Encounter: Payer: Self-pay | Admitting: Emergency Medicine

## 2014-05-19 VITALS — BP 126/80 | HR 88 | Temp 98.6°F | Resp 18 | Ht 63.25 in | Wt 176.0 lb

## 2014-05-19 DIAGNOSIS — R5383 Other fatigue: Secondary | ICD-10-CM

## 2014-05-19 DIAGNOSIS — E559 Vitamin D deficiency, unspecified: Secondary | ICD-10-CM

## 2014-05-19 DIAGNOSIS — E782 Mixed hyperlipidemia: Secondary | ICD-10-CM

## 2014-05-19 DIAGNOSIS — R0683 Snoring: Secondary | ICD-10-CM

## 2014-05-19 DIAGNOSIS — D649 Anemia, unspecified: Secondary | ICD-10-CM

## 2014-05-19 LAB — HEPATIC FUNCTION PANEL
ALK PHOS: 55 U/L (ref 39–117)
ALT: 20 U/L (ref 0–35)
AST: 12 U/L (ref 0–37)
Albumin: 4.1 g/dL (ref 3.5–5.2)
BILIRUBIN DIRECT: 0.1 mg/dL (ref 0.0–0.3)
Indirect Bilirubin: 0.3 mg/dL (ref 0.2–1.2)
Total Bilirubin: 0.4 mg/dL (ref 0.2–1.2)
Total Protein: 7.4 g/dL (ref 6.0–8.3)

## 2014-05-19 LAB — LIPID PANEL
Cholesterol: 145 mg/dL (ref 0–200)
HDL: 51 mg/dL (ref >39)
LDL Cholesterol: 66 mg/dL (ref 0–99)
Total CHOL/HDL Ratio: 2.8 Ratio
Triglycerides: 142 mg/dL (ref <150)
VLDL: 28 mg/dL (ref 0–40)

## 2014-05-19 LAB — CBC WITH DIFFERENTIAL/PLATELET
Basophils Absolute: 0 10*3/uL (ref 0.0–0.1)
Basophils Relative: 0 % (ref 0–1)
EOS PCT: 1 % (ref 0–5)
Eosinophils Absolute: 0.1 10*3/uL (ref 0.0–0.7)
HCT: 42.8 % (ref 36.0–46.0)
Hemoglobin: 14.9 g/dL (ref 12.0–15.0)
LYMPHS ABS: 2.6 10*3/uL (ref 0.7–4.0)
Lymphocytes Relative: 31 % (ref 12–46)
MCH: 32.3 pg (ref 26.0–34.0)
MCHC: 34.8 g/dL (ref 30.0–36.0)
MCV: 92.6 fL (ref 78.0–100.0)
MONO ABS: 0.4 10*3/uL (ref 0.1–1.0)
MONOS PCT: 5 % (ref 3–12)
MPV: 10 fL (ref 8.6–12.4)
Neutro Abs: 5.4 10*3/uL (ref 1.7–7.7)
Neutrophils Relative %: 63 % (ref 43–77)
PLATELETS: 249 10*3/uL (ref 150–400)
RBC: 4.62 MIL/uL (ref 3.87–5.11)
RDW: 13 % (ref 11.5–15.5)
WBC: 8.5 10*3/uL (ref 4.0–10.5)

## 2014-05-19 LAB — MAGNESIUM: MAGNESIUM: 2.1 mg/dL (ref 1.5–2.5)

## 2014-05-19 LAB — IRON AND TIBC
%SAT: 25 % (ref 20–55)
Iron: 88 ug/dL (ref 42–145)
TIBC: 357 ug/dL (ref 250–470)
UIBC: 269 ug/dL (ref 125–400)

## 2014-05-19 NOTE — Patient Instructions (Signed)

## 2014-05-19 NOTE — Progress Notes (Signed)
Subjective:     Patient ID: Emily Nolan, female   DOB: June 18, 1975, 39 y.o.   MRN: [id number]  HPI Comments: 39 yo female for Cholesterol and vitamin recheck. She has been eating a little better but not exercising as much in regards to her cholesterol. She did not add FO for TG.   She added B12/ Magnesium/ Vit D AD at last lab with borderline low levels.  She notes she is still fatigued. She has had sleep study with NEG for apnea but was recommended for removal of turbinates/ tonsils to open airway. She still snores and would like second opinion.  She only uses Xanax for travel and notes that is rare.  Lab Results      Component                Value               Date                      WBC                      8.8                 11/11/2013                HGB                      13.7                11/11/2013                HCT                      39.3                11/11/2013                PLT                      229                 11/11/2013                GLUCOSE                  91                  11/11/2013                CHOL                     142                 11/11/2013                TRIG                     228*                11/11/2013                HDL                      48                  11/11/2013  LDLCALC                  48                  11/11/2013                ALT                      14                  11/11/2013                AST                      15                  11/11/2013                NA                       138                 11/11/2013                K                        3.7                 11/11/2013                CL                       102                 11/11/2013                CREATININE               0.52                11/11/2013                BUN                      9                   11/11/2013                CO2                      25                  11/11/2013                TSH                       0.997               11/11/2013                HGBA1C                   5.3                 11/11/2013                MICROALBUR  0.89                11/11/2013        IRON 19 B12 352        Medication List       This list is accurate as of: 05/19/14 11:22 AM.  Always use your most recent med list.               ALPRAZolam 0.25 MG tablet  Commonly known as:  XANAX  Take 1 tablet (0.25 mg total) by mouth 2 (two) times daily as needed for anxiety.     Magnesium 250 MG Tabs  Take by mouth.     valACYclovir 500 MG tablet  Commonly known as:  VALTREX  Take 500 mg by mouth daily as needed.     vitamin B-12 100 MCG tablet  Commonly known as:  CYANOCOBALAMIN  Take 100 mcg by mouth daily.     Vitamin D 2000 UNITS Caps  Take by mouth.       Allergies  Allergen Reactions  . Azithromycin Nausea Only   Past Medical History  Diagnosis Date  . HSV-2 infection   . Benign hematuria     with negative GU workup     Review of Systems  Constitutional: Positive for fatigue.  Respiratory: Negative for shortness of breath.   Cardiovascular: Negative for chest pain.  Psychiatric/Behavioral: Positive for sleep disturbance.  All other systems reviewed and are negative.     BP 126/80 mmHg  Pulse 88  Temp(Src) 98.6 F (37 C) (Temporal)  Resp 18  Ht 5' 3.25" (1.607 m)  Wt 176 lb (79.833 kg)  BMI 30.91 kg/m2  LMP 04/24/2014  Objective:   Physical Exam  Constitutional: She appears well-developed and well-nourished.  HENT:  Nose: Nose normal.  Mouth/Throat: Oropharynx is clear and moist.  Large tongue partially obstructs airway  Eyes: Conjunctivae and EOM are normal.  Neck: Normal range of motion.  Cardiovascular: Normal rate, regular rhythm, normal heart sounds and intact distal pulses.   Pulmonary/Chest: Effort normal and breath sounds normal.  Abdominal: Soft. There is no tenderness.  Musculoskeletal: Normal range of motion.  Lymphadenopathy:     She has no cervical adenopathy.  Neurological: She is alert.  Skin: Skin is warm and dry. No rash noted.  Psychiatric: She has a normal mood and affect. Judgment normal.  Nursing note and vitals reviewed.      Assessment:     Other fatigue - Plan: CBC with Differential/Platelet  Anemia, unspecified anemia type - Plan: Vitamin B12, Iron and TIBC  Vitamin D deficiency - Plan: Vit D  25 hydroxy (rtn osteoporosis monitoring), Magnesium  Mixed hyperlipidemia - Plan: Lipid panel, Hepatic function panel       Plan:        1. Anemia vs Fatigue vs VIT D DEF- If labs NEG need to get snoring re-evaluated. Patient requests Referral ENT for second opinion of need for surgery with snoring. Newman in past.   2. Cholesterol- recheck labs, Need to eat healthier and exercise AD.

## 2014-05-20 LAB — VITAMIN D 25 HYDROXY (VIT D DEFICIENCY, FRACTURES): VIT D 25 HYDROXY: 13 ng/mL — AB (ref 30–100)

## 2014-05-20 LAB — VITAMIN B12: Vitamin B-12: 496 pg/mL (ref 211–911)

## 2014-07-01 ENCOUNTER — Ambulatory Visit (HOSPITAL_BASED_OUTPATIENT_CLINIC_OR_DEPARTMENT_OTHER): Payer: 59 | Attending: Otolaryngology | Admitting: Radiology

## 2014-07-01 DIAGNOSIS — G4710 Hypersomnia, unspecified: Secondary | ICD-10-CM | POA: Diagnosis not present

## 2014-07-01 DIAGNOSIS — R0683 Snoring: Secondary | ICD-10-CM | POA: Insufficient documentation

## 2014-07-01 DIAGNOSIS — R5383 Other fatigue: Secondary | ICD-10-CM | POA: Insufficient documentation

## 2014-07-01 DIAGNOSIS — F329 Major depressive disorder, single episode, unspecified: Secondary | ICD-10-CM | POA: Diagnosis not present

## 2014-07-01 DIAGNOSIS — G4730 Sleep apnea, unspecified: Secondary | ICD-10-CM | POA: Diagnosis not present

## 2014-07-12 DIAGNOSIS — G4730 Sleep apnea, unspecified: Secondary | ICD-10-CM | POA: Diagnosis not present

## 2014-07-12 NOTE — Sleep Study (Signed)
    NAME: Emily Nolan DATE OF BIRTH:  Jan 08, 1976 MEDICAL RECORD NUMBER [id number]  LOCATION: [facility]  PHYSICIAN: YOUNG,CLINTON D  DATE OF STUDY: 07/01/2014  SLEEP STUDY TYPE: Out of Center Sleep Test- unattended                REFERRING PHYSICIAN: Melida Quitter, MD  INDICATION FOR STUDY: Hypersomnia with sleep apnea  EPWORTH SLEEPINESS SCORE:   9/24 HEIGHT:   5 feet 3 inches WEIGHT:     BMI 28  NECK SIZE:     MEDICATIONS: Charted for review  IMPRESSION:  Mild obstructive sleep apnea/hypopnea syndrome, AHI 13 per hour. 97 total events scored including 16 obstructive apneas, 3 mixed apneas, 78 hypopneas. Oxygen desaturation to a nadir of 81% with average oxygen saturation 95%. Average heart rate 75.3/m    RECOMMENDATION:  Scores in this range may respond to conservative therapy including weight loss and encouragement to sleep off flat of back. More commonly, active intervention such as CPAP or an oral appliance is used.   Deneise Lever Diplomate, American Board of Sleep Medicine  ELECTRONICALLY SIGNED ON:  07/12/2014, 3:37 PM [place] PH: [phone]   FX: [phone] [place]

## 2014-11-13 ENCOUNTER — Encounter: Payer: Self-pay | Admitting: Internal Medicine

## 2014-11-13 ENCOUNTER — Encounter: Payer: Self-pay | Admitting: Emergency Medicine

## 2014-12-23 ENCOUNTER — Encounter: Payer: Self-pay | Admitting: Internal Medicine

## 2015-01-07 ENCOUNTER — Ambulatory Visit (INDEPENDENT_AMBULATORY_CARE_PROVIDER_SITE_OTHER): Payer: Commercial Managed Care - HMO | Admitting: Internal Medicine

## 2015-01-07 VITALS — BP 150/88 | HR 76 | Temp 98.0°F | Resp 18 | Ht 63.25 in | Wt 174.0 lb

## 2015-01-07 DIAGNOSIS — Z136 Encounter for screening for cardiovascular disorders: Secondary | ICD-10-CM

## 2015-01-07 DIAGNOSIS — Z1389 Encounter for screening for other disorder: Secondary | ICD-10-CM

## 2015-01-07 DIAGNOSIS — Z13220 Encounter for screening for lipoid disorders: Secondary | ICD-10-CM

## 2015-01-07 DIAGNOSIS — Z130 Encounter for screening for diseases of the blood and blood-forming organs and certain disorders involving the immune mechanism: Secondary | ICD-10-CM

## 2015-01-07 DIAGNOSIS — Z0000 Encounter for general adult medical examination without abnormal findings: Secondary | ICD-10-CM | POA: Diagnosis not present

## 2015-01-07 DIAGNOSIS — G4730 Sleep apnea, unspecified: Secondary | ICD-10-CM

## 2015-01-07 DIAGNOSIS — G4710 Hypersomnia, unspecified: Secondary | ICD-10-CM

## 2015-01-07 DIAGNOSIS — Z1329 Encounter for screening for other suspected endocrine disorder: Secondary | ICD-10-CM

## 2015-01-07 DIAGNOSIS — Z131 Encounter for screening for diabetes mellitus: Secondary | ICD-10-CM

## 2015-01-07 DIAGNOSIS — E559 Vitamin D deficiency, unspecified: Secondary | ICD-10-CM

## 2015-01-07 MED ORDER — MODAFINIL 100 MG PO TABS: 100.0000 mg | ORAL_TABLET | Freq: Every day | ORAL | Status: DC

## 2015-01-07 MED ORDER — ARMODAFINIL 150 MG PO TABS: 150.0000 mg | ORAL_TABLET | Freq: Every day | ORAL | Status: DC

## 2015-01-07 NOTE — Progress Notes (Signed)
Patient ID: Emily Nolan, female   DOB: 12/29/1975, 39 y.o.   MRN: [id number]    Annual Screening Comprehensive Examination   This very nice 39 y.o.female presents for complete physical.  Patient has no major health issues.  Patient reports no complaints at this time.   Finally, patient has history of Vitamin D Deficiency and last vitamin D was  Lab Results  Component Value Date   VD25OH 13* 05/19/2014  .  Currently on supplementation  She does follow with Obgyn for pap smears.  She reports that she is due for a visit.  She was diagnosed with mild sleep apnea, but could not tolerate a CPAP machine.  She has spoken with the ENT and she is not interested in having surgery performed.  She is not sure that she can afford the mouthpieces for sleep apnea either.  She is sleeping from 9 PM to 7 AM.    Current Outpatient Prescriptions on File Prior to Visit  Medication Sig Dispense Refill  . Cholecalciferol (VITAMIN D) 2000 UNITS CAPS Take by mouth.    . Magnesium 250 MG TABS Take by mouth.    . valACYclovir (VALTREX) 500 MG tablet Take 500 mg by mouth daily as needed.     . vitamin B-12 (CYANOCOBALAMIN) 100 MCG tablet Take 100 mcg by mouth daily.     No current facility-administered medications on file prior to visit.    Allergies  Allergen Reactions  . Azithromycin Nausea Only    Past Medical History  Diagnosis Date  . HSV-2 infection   . Benign hematuria     with negative GU workup    Immunization History  Administered Date(s) Administered  . PPD Test 11/11/2013  . Tdap 04/11/2010    Past Surgical History  Procedure Laterality Date  . Colonscopy    . Tubligation      Family History  Problem Relation Age of Onset  . Heart disease Mother   . Hypertension Father   . Heart disease Father   . Alcohol abuse Father   . Hyperlipidemia Father   . Stroke Paternal Uncle   . Diabetes Paternal Uncle   . Heart disease Maternal Grandmother     Social History   Social  History  . Marital Status: Married    Spouse Name: N/A  . Number of Children: N/A  . Years of Education: N/A   Occupational History  . Not on file.   Social History Main Topics  . Smoking status: Never Smoker   . Smokeless tobacco: Not on file  . Alcohol Use: Yes     Comment: Rare  . Drug Use: No  . Sexual Activity: Not on file   Other Topics Concern  . Not on file   Social History Narrative   Review of Systems  Constitutional: Positive for malaise/fatigue. Negative for fever and chills.  HENT: Positive for congestion. Negative for ear pain and sore throat.   Eyes: Negative.   Respiratory: Negative for cough, shortness of breath and wheezing.   Cardiovascular: Negative for chest pain, palpitations and leg swelling.  Gastrointestinal: Negative for heartburn, diarrhea, constipation, blood in stool and melena.  Genitourinary: Negative.   Musculoskeletal: Negative.   Neurological: Negative for dizziness, sensory change, loss of consciousness and headaches.  Psychiatric/Behavioral: Negative for depression. The patient is not nervous/anxious and does not have insomnia.       Physical Exam  BP 150/88 mmHg  Pulse 76  Temp(Src) 98 F (36.7 C) (Temporal)  Resp  18  Ht 5' 3.25" (1.607 m)  Wt 174 lb (78.926 kg)  BMI 30.56 kg/m2  General Appearance: Well nourished and in no apparent distress. Eyes: PERRLA, EOMs, conjunctiva no swelling or erythema, normal fundi and vessels. Sinuses: No frontal/maxillary tenderness ENT/Mouth: EACs patent / TMs  nl. Nares clear without erythema, swelling, mucoid exudates. Oral hygiene is good. No erythema, swelling, or exudate. Tongue normal, non-obstructing. Tonsils not swollen or erythematous. Hearing normal.  Neck: Supple, thyroid normal. No bruits, nodes or JVD. Respiratory: Respiratory effort normal.  BS equal and clear bilateral without rales, rhonci, wheezing or stridor. Cardio: Heart sounds are normal with regular rate and rhythm and no  murmurs, rubs or gallops. Peripheral pulses are normal and equal bilaterally without edema. No aortic or femoral bruits. Chest: symmetric with normal excursions and percussion. Breasts: Symmetric, without lumps, nipple discharge, retractions, or fibrocystic changes.  Abdomen: Flat, soft, with bowl sounds. Nontender, no guarding, rebound, hernias, masses, or organomegaly.  Lymphatics: Non tender without lymphadenopathy.  Genitourinary:  Musculoskeletal: Full ROM all peripheral extremities, joint stability, 5/5 strength, and normal gait. Skin: Warm and dry without rashes, lesions, cyanosis, clubbing or  ecchymosis.  Neuro: Cranial nerves intact, reflexes equal bilaterally. Normal muscle tone, no cerebellar symptoms. Sensation intact.  Pysch: Awake and oriented X 3, normal affect, Insight and Judgment appropriate.   Assessment and Plan   1. Routine general medical examination at a health care facility  - CBC with Differential/Platelet - BASIC METABOLIC PANEL WITH GFR - Hepatic function panel - Magnesium - Lipid panel - Hemoglobin A1c - Insulin, random - Iron and TIBC - Vitamin B12 - Urinalysis, Routine w reflex microscopic (not at [facility]) - Microalbumin / creatinine urine ratio - EKG 12-Lead - Vit D  25 hydroxy (rtn osteoporosis monitoring) - TSH  2. Screening for diabetes mellitus  - Hemoglobin A1c - Insulin, random  3. Screening for hyperlipidemia  - Lipid panel  4. Screening for deficiency anemia  - Iron and TIBC - Vitamin B12  5. Screening for hematuria or proteinuria  - Urinalysis, Routine w reflex microscopic (not at [facility]) - Microalbumin / creatinine urine ratio  6. Screening for thyroid disorder  - TSH  7. Vitamin D deficiency  - Vit D  25 hydroxy (rtn osteoporosis monitoring)  8. Screening for cardiovascular condition  - EKG 12-Lead   9.  Hypersomnia with sleep apnea -failed cpap therapy -try nuvigil x 1 month.  Samples given.       Continue  prudent diet as discussed, weight control, regular exercise, and medications. Routine screening labs and tests as requested with regular follow-up as recommended.  Over 40 minutes of exam, counseling, chart review and critical decision making was performed

## 2015-01-07 NOTE — Patient Instructions (Signed)
Armodafinil tablets What is this medicine? ARMODAFINIL (ar moe DAF i nil) is used to treat excessive sleepiness caused by certain sleep disorders. This includes narcolepsy, sleep apnea, and shift work sleep disorder. This medicine may be used for other purposes; ask your health care provider or pharmacist if you have questions. COMMON BRAND NAME(S): Nuvigil What should I tell my health care provider before I take this medicine? They need to know if you have any of these conditions: -kidney disease -liver disease -an unusual or allergic reaction to armodafinil, modafinil, medicines, foods, dyes, or preservatives -pregnant or trying to get pregnant -breast-feeding How should I use this medicine? Take this medicine by mouth with a glass of water. Follow the directions on the prescription label. Take your doses at regular intervals. Do not take your medicine more often than directed. Do not stop taking except on your doctor's advice. A special MedGuide will be given to you by the pharmacist with each prescription and refill. Be sure to read this information carefully each time. Talk to your pediatrician regarding the use of this medicine in children. While this drug may be prescribed for children as young as 66 years of age for selected conditions, precautions do apply. Overdosage: If you think you have taken too much of this medicine contact a poison control center or emergency room at once. NOTE: This medicine is only for you. Do not share this medicine with others. What if I miss a dose? If you miss a dose, take it as soon as you can. If it is almost time for your next dose, take only that dose. Do not take double or extra doses. What may interact with this medicine? Do not take this medicine with any of the following medications: -amphetamine or dextroamphetamine -dexmethylphenidate or methylphenidate -MAOIs like Carbex, Eldepryl, Marplan, Nardil, and Parnate -pemoline -procarbazine This  medicine may also interact with the following medications: -antifungal medicines like itraconazole or ketoconazole -barbiturates, like phenobarbital -birth control pills or other hormone-containing birth control devices or implants -carbamazepine -cyclosporine -diazepam -medicines for depression, anxiety, or psychotic disturbances -phenytoin -propranolol -triazolam -warfarin This list may not describe all possible interactions. Give your health care provider a list of all the medicines, herbs, non-prescription drugs, or dietary supplements you use. Also tell them if you smoke, drink alcohol, or use illegal drugs. Some items may interact with your medicine. What should I watch for while using this medicine? Visit your doctor or health care professional for regular checks on your progress. The full effect of this medicine may not be seen right away. This medicine may affect your concentration, function, or may hide signs that you are tired. You may get dizzy. Do not drive, use machinery, or do anything that needs mental alertness until you know how this drug affects you. Alcohol can make you more dizzy and may interfere with your response to this medicine or your alertness. Avoid alcoholic drinks. Birth control pills may not work properly while you are taking this medicine. Talk to your doctor about using an extra method of birth control. It is unknown if the effects of this medicine will be increased by the use of caffeine. Caffeine is found in many foods, beverages, and medications. Ask your doctor if you should limit or change your intake of caffeine-containing products while on this medicine. What side effects may I notice from receiving this medicine? Side effects that you should report to your doctor or health care professional as soon as possible: -allergic reactions like skin  rash, itching or hives, swelling of the face, lips, or tongue -breathing problems -chest pain -fast, irregular  heartbeat -increased blood pressure, particularly if you have high blood pressure -mental problems -sore throat, fever, or chills -tremors -vomiting Side effects that usually do not require medical attention (report to your doctor or health care professional if they continue or are bothersome): -headache -nausea, diarrhea, or stomach upset -nervousness -trouble sleeping This list may not describe all possible side effects. Call your doctor for medical advice about side effects. You may report side effects to FDA at 1-800-FDA-1088. Where should I keep my medicine? Keep out of the reach of children. Store at room temperature between 20 and 25 degrees C (68 and 77 degrees F). Throw away any unused medicine after the expiration date. NOTE: This sheet is a summary. It may not cover all possible information. If you have questions about this medicine, talk to your doctor, pharmacist, or health care provider.  2015, [employer]vier/Gold Standard. (2009-03-10 21:10:28)   Preventive Care for Adults  A healthy lifestyle and preventive care can promote health and wellness. Preventive health guidelines for women include the following key practices.  A routine yearly physical is a good way to check with your health care provider about your health and preventive screening. It is a chance to share any concerns and updates on your health and to receive a thorough exam.  Visit your dentist for a routine exam and preventive care every 6 months. Brush your teeth twice a day and floss once a day. Good oral hygiene prevents tooth decay and gum disease.  The frequency of eye exams is based on your age, health, family medical history, use of contact lenses, and other factors. Follow your health care provider's recommendations for frequency of eye exams.  Eat a healthy diet. Foods like vegetables, fruits, whole grains, low-fat dairy products, and lean protein foods contain the nutrients you need without too many  calories. Decrease your intake of foods high in solid fats, added sugars, and salt. Eat the right amount of calories for you.Get information about a proper diet from your health care provider, if necessary.  Regular physical exercise is one of the most important things you can do for your health. Most adults should get at least 150 minutes of moderate-intensity exercise (any activity that increases your heart rate and causes you to sweat) each week. In addition, most adults need muscle-strengthening exercises on 2 or more days a week.  Maintain a healthy weight. The body mass index (BMI) is a screening tool to identify possible weight problems. It provides an estimate of body fat based on height and weight. Your health care provider can find your BMI and can help you achieve or maintain a healthy weight.For adults 20 years and older:  A BMI below 18.5 is considered underweight.  A BMI of 18.5 to 24.9 is normal.  A BMI of 25 to 29.9 is considered overweight.  A BMI of 30 and above is considered obese.  Maintain normal blood lipids and cholesterol levels by exercising and minimizing your intake of saturated fat. Eat a balanced diet with plenty of fruit and vegetables. Blood tests for lipids and cholesterol should begin at age 1 and be repeated every 5 years. If your lipid or cholesterol levels are high, you are over 50, or you are at high risk for heart disease, you may need your cholesterol levels checked more frequently.Ongoing high lipid and cholesterol levels should be treated with medicines if diet and  exercise are not working.  If you smoke, find out from your health care provider how to quit. If you do not use tobacco, do not start.  Lung cancer screening is recommended for adults aged 47-80 years who are at high risk for developing lung cancer because of a history of smoking. A yearly low-dose CT scan of the lungs is recommended for people who have at least a 30-pack-year history of  smoking and are a current smoker or have quit within the past 15 years. A pack year of smoking is smoking an average of 1 pack of cigarettes a day for 1 year (for example: 1 pack a day for 30 years or 2 packs a day for 15 years). Yearly screening should continue until the smoker has stopped smoking for at least 15 years. Yearly screening should be stopped for people who develop a health problem that would prevent them from having lung cancer treatment.  If you are pregnant, do not drink alcohol. If you are breastfeeding, be very cautious about drinking alcohol. If you are not pregnant and choose to drink alcohol, do not have more than 1 drink per day. One drink is considered to be 12 ounces (355 mL) of beer, 5 ounces (148 mL) of wine, or 1.5 ounces (44 mL) of liquor.  Avoid use of street drugs. Do not share needles with anyone. Ask for help if you need support or instructions about stopping the use of drugs.  High blood pressure causes heart disease and increases the risk of stroke. Your blood pressure should be checked at least every 1 to 2 years. Ongoing high blood pressure should be treated with medicines if weight loss and exercise do not work.  If you are 49-56 years old, ask your health care provider if you should take aspirin to prevent strokes.  Diabetes screening involves taking a blood sample to check your fasting blood sugar level. This should be done once every 3 years, after age 48, if you are within normal weight and without risk factors for diabetes. Testing should be considered at a younger age or be carried out more frequently if you are overweight and have at least 1 risk factor for diabetes.  Breast cancer screening is essential preventive care for women. You should practice "breast self-awareness." This means understanding the normal appearance and feel of your breasts and may include breast self-examination. Any changes detected, no matter how small, should be reported to a health  care provider. Women in their 54s and 30s should have a clinical breast exam (CBE) by a health care provider as part of a regular health exam every 1 to 3 years. After age 65, women should have a CBE every year. Starting at age 24, women should consider having a mammogram (breast X-ray test) every year. Women who have a family history of breast cancer should talk to their health care provider about genetic screening. Women at a high risk of breast cancer should talk to their health care providers about having an MRI and a mammogram every year.  Breast cancer gene (BRCA)-related cancer risk assessment is recommended for women who have family members with BRCA-related cancers. BRCA-related cancers include breast, ovarian, tubal, and peritoneal cancers. Having family members with these cancers may be associated with an increased risk for harmful changes (mutations) in the breast cancer genes BRCA1 and BRCA2. Results of the assessment will determine the need for genetic counseling and BRCA1 and BRCA2 testing.  Routine pelvic exams to screen for cancer  are no longer recommended for nonpregnant women who are considered low risk for cancer of the pelvic organs (ovaries, uterus, and vagina) and who do not have symptoms. Ask your health care provider if a screening pelvic exam is right for you.  If you have had past treatment for cervical cancer or a condition that could lead to cancer, you need Pap tests and screening for cancer for at least 20 years after your treatment. If Pap tests have been discontinued, your risk factors (such as having a new sexual partner) need to be reassessed to determine if screening should be resumed. Some women have medical problems that increase the chance of getting cervical cancer. In these cases, your health care provider may recommend more frequent screening and Pap tests.  The HPV test is an additional test that may be used for cervical cancer screening. The HPV test looks for the  virus that can cause the cell changes on the cervix. The cells collected during the Pap test can be tested for HPV. The HPV test could be used to screen women aged 70 years and older, and should be used in women of any age who have unclear Pap test results. After the age of 33, women should have HPV testing at the same frequency as a Pap test.  Colorectal cancer can be detected and often prevented. Most routine colorectal cancer screening begins at the age of 63 years and continues through age 20 years. However, your health care provider may recommend screening at an earlier age if you have risk factors for colon cancer. On a yearly basis, your health care provider may provide home test kits to check for hidden blood in the stool. Use of a small camera at the end of a tube, to directly examine the colon (sigmoidoscopy or colonoscopy), can detect the earliest forms of colorectal cancer. Talk to your health care provider about this at age 19, when routine screening begins. Direct exam of the colon should be repeated every 5-10 years through age 15 years, unless early forms of pre-cancerous polyps or small growths are found.  People who are at an increased risk for hepatitis B should be screened for this virus. You are considered at high risk for hepatitis B if:  You were born in a country where hepatitis B occurs often. Talk with your health care provider about which countries are considered high risk.  Your parents were born in a high-risk country and you have not received a shot to protect against hepatitis B (hepatitis B vaccine).  You have HIV or AIDS.  You use needles to inject street drugs.  You live with, or have sex with, someone who has hepatitis B.  You get hemodialysis treatment.  You take certain medicines for conditions like cancer, organ transplantation, and autoimmune conditions.  Hepatitis C blood testing is recommended for all people born from [birth year] through 1963 and any individual  with known risks for hepatitis C.  Practice safe sex. Use condoms and avoid high-risk sexual practices to reduce the spread of sexually transmitted infections (STIs). STIs include gonorrhea, chlamydia, syphilis, trichomonas, herpes, HPV, and human immunodeficiency virus (HIV). Herpes, HIV, and HPV are viral illnesses that have no cure. They can result in disability, cancer, and death.  You should be screened for sexually transmitted illnesses (STIs) including gonorrhea and chlamydia if:  You are sexually active and are younger than 24 years.  You are older than 24 years and your health care provider tells you that you  are at risk for this type of infection.  Your sexual activity has changed since you were last screened and you are at an increased risk for chlamydia or gonorrhea. Ask your health care provider if you are at risk.  If you are at risk of being infected with HIV, it is recommended that you take a prescription medicine daily to prevent HIV infection. This is called preexposure prophylaxis (PrEP). You are considered at risk if:  You are a heterosexual woman, are sexually active, and are at increased risk for HIV infection.  You take drugs by injection.  You are sexually active with a partner who has HIV.  Talk with your health care provider about whether you are at high risk of being infected with HIV. If you choose to begin PrEP, you should first be tested for HIV. You should then be tested every 3 months for as long as you are taking PrEP.  Osteoporosis is a disease in which the bones lose minerals and strength with aging. This can result in serious bone fractures or breaks. The risk of osteoporosis can be identified using a bone density scan. Women ages 64 years and over and women at risk for fractures or osteoporosis should discuss screening with their health care providers. Ask your health care provider whether you should take a calcium supplement or vitamin D to reduce the rate  of osteoporosis.  Menopause can be associated with physical symptoms and risks. Hormone replacement therapy is available to decrease symptoms and risks. You should talk to your health care provider about whether hormone replacement therapy is right for you.  Use sunscreen. Apply sunscreen liberally and repeatedly throughout the day. You should seek shade when your shadow is shorter than you. Protect yourself by wearing long sleeves, pants, a wide-brimmed hat, and sunglasses year round, whenever you are outdoors.  Once a month, do a whole body skin exam, using a mirror to look at the skin on your back. Tell your health care provider of new moles, moles that have irregular borders, moles that are larger than a pencil eraser, or moles that have changed in shape or color.  Stay current with required vaccines (immunizations).  Influenza vaccine. All adults should be immunized every year.  Tetanus, diphtheria, and acellular pertussis (Td, Tdap) vaccine. Pregnant women should receive 1 dose of Tdap vaccine during each pregnancy. The dose should be obtained regardless of the length of time since the last dose. Immunization is preferred during the 27th-36th week of gestation. An adult who has not previously received Tdap or who does not know her vaccine status should receive 1 dose of Tdap. This initial dose should be followed by tetanus and diphtheria toxoids (Td) booster doses every 10 years. Adults with an unknown or incomplete history of completing a 3-dose immunization series with Td-containing vaccines should begin or complete a primary immunization series including a Tdap dose. Adults should receive a Td booster every 10 years.  Varicella vaccine. An adult without evidence of immunity to varicella should receive 2 doses or a second dose if she has previously received 1 dose. Pregnant females who do not have evidence of immunity should receive the first dose after pregnancy. This first dose should be  obtained before leaving the health care facility. The second dose should be obtained 4-8 weeks after the first dose.  Human papillomavirus (HPV) vaccine. Females aged 13-26 years who have not received the vaccine previously should obtain the 3-dose series. The vaccine is not recommended for use  in pregnant females. However, pregnancy testing is not needed before receiving a dose. If a female is found to be pregnant after receiving a dose, no treatment is needed. In that case, the remaining doses should be delayed until after the pregnancy. Immunization is recommended for any person with an immunocompromised condition through the age of 60 years if she did not get any or all doses earlier. During the 3-dose series, the second dose should be obtained 4-8 weeks after the first dose. The third dose should be obtained 24 weeks after the first dose and 16 weeks after the second dose.  Zoster vaccine. One dose is recommended for adults aged 55 years or older unless certain conditions are present.  Measles, mumps, and rubella (MMR) vaccine. Adults born before 75 generally are considered immune to measles and mumps. Adults born in [birth year] or later should have 1 or more doses of MMR vaccine unless there is a contraindication to the vaccine or there is laboratory evidence of immunity to each of the three diseases. A routine second dose of MMR vaccine should be obtained at least 28 days after the first dose for students attending postsecondary schools, health care workers, or international travelers. People who received inactivated measles vaccine or an unknown type of measles vaccine during 1963-1967 should receive 2 doses of MMR vaccine. People who received inactivated mumps vaccine or an unknown type of mumps vaccine before 1979 and are at high risk for mumps infection should consider immunization with 2 doses of MMR vaccine. For females of childbearing age, rubella immunity should be determined. If there is no evidence  of immunity, females who are not pregnant should be vaccinated. If there is no evidence of immunity, females who are pregnant should delay immunization until after pregnancy. Unvaccinated health care workers born before [birth year] who lack laboratory evidence of measles, mumps, or rubella immunity or laboratory confirmation of disease should consider measles and mumps immunization with 2 doses of MMR vaccine or rubella immunization with 1 dose of MMR vaccine.  Pneumococcal 13-valent conjugate (PCV13) vaccine. When indicated, a person who is uncertain of her immunization history and has no record of immunization should receive the PCV13 vaccine. An adult aged 31 years or older who has certain medical conditions and has not been previously immunized should receive 1 dose of PCV13 vaccine. This PCV13 should be followed with a dose of pneumococcal polysaccharide (PPSV23) vaccine. The PPSV23 vaccine dose should be obtained at least 8 weeks after the dose of PCV13 vaccine. An adult aged 24 years or older who has certain medical conditions and previously received 1 or more doses of PPSV23 vaccine should receive 1 dose of PCV13. The PCV13 vaccine dose should be obtained 1 or more years after the last PPSV23 vaccine dose.  Pneumococcal polysaccharide (PPSV23) vaccine. When PCV13 is also indicated, PCV13 should be obtained first. All adults aged 28 years and older should be immunized. An adult younger than age 65 years who has certain medical conditions should be immunized. Any person who resides in a nursing home or long-term care facility should be immunized. An adult smoker should be immunized. People with an immunocompromised condition and certain other conditions should receive both PCV13 and PPSV23 vaccines. People with human immunodeficiency virus (HIV) infection should be immunized as soon as possible after diagnosis. Immunization during chemotherapy or radiation therapy should be avoided. Routine use of PPSV23 vaccine  is not recommended for American Indians, [place] Natives, or people younger than 65 years unless there are medical  conditions that require PPSV23 vaccine. When indicated, people who have unknown immunization and have no record of immunization should receive PPSV23 vaccine. One-time revaccination 5 years after the first dose of PPSV23 is recommended for people aged 19-64 years who have chronic kidney failure, nephrotic syndrome, asplenia, or immunocompromised conditions. People who received 1-2 doses of PPSV23 before age 78 years should receive another dose of PPSV23 vaccine at age 40 years or later if at least 5 years have passed since the previous dose. Doses of PPSV23 are not needed for people immunized with PPSV23 at or after age 96 years.  Meningococcal vaccine. Adults with asplenia or persistent complement component deficiencies should receive 2 doses of quadrivalent meningococcal conjugate (MenACWY-D) vaccine. The doses should be obtained at least 2 months apart. Microbiologists working with certain meningococcal bacteria, [employer] recruits, people at risk during an outbreak, and people who travel to or live in countries with a high rate of meningitis should be immunized. A first-year college student up through age 26 years who is living in a residence hall should receive a dose if she did not receive a dose on or after her 16th birthday. Adults who have certain high-risk conditions should receive one or more doses of vaccine.  Hepatitis A vaccine. Adults who wish to be protected from this disease, have certain high-risk conditions, work with hepatitis A-infected animals, work in hepatitis A research labs, or travel to or work in countries with a high rate of hepatitis A should be immunized. Adults who were previously unvaccinated and who anticipate close contact with an international adoptee during the first 60 days after arrival in the [place] from a country with a high rate of hepatitis A should be  immunized.  Hepatitis B vaccine. Adults who wish to be protected from this disease, have certain high-risk conditions, may be exposed to blood or other infectious body fluids, are household contacts or sex partners of hepatitis B positive people, are clients or workers in certain care facilities, or travel to or work in countries with a high rate of hepatitis B should be immunized.  Haemophilus influenzae type b (Hib) vaccine. A previously unvaccinated person with asplenia or sickle cell disease or having a scheduled splenectomy should receive 1 dose of Hib vaccine. Regardless of previous immunization, a recipient of a hematopoietic stem cell transplant should receive a 3-dose series 6-12 months after her successful transplant. Hib vaccine is not recommended for adults with HIV infection. Preventive Services / Frequency  Ages 5 to 1 years  Blood pressure check.  Lipid and cholesterol check.  Clinical breast exam.** / Every 3 years for women in their 48s and 44s.  BRCA-related cancer risk assessment.** / For women who have family members with a BRCA-related cancer (breast, ovarian, tubal, or peritoneal cancers).  Pap test.** / Every 2 years from ages 76 through 28. Every 3 years starting at age 2 through age 33 or 58 with a history of 3 consecutive normal Pap tests.  HPV screening.** / Every 3 years from ages 65 through ages 4 to 81 with a history of 3 consecutive normal Pap tests.  Hepatitis C blood test.** / For any individual with known risks for hepatitis C.  Skin self-exam. / Monthly.  Influenza vaccine. / Every year.  Tetanus, diphtheria, and acellular pertussis (Tdap, Td) vaccine.** / Consult your health care provider. Pregnant women should receive 1 dose of Tdap vaccine during each pregnancy. 1 dose of Td every 10 years.  Varicella vaccine.** / Consult your  health care provider. Pregnant females who do not have evidence of immunity should receive the first dose after  pregnancy.  HPV vaccine. / 3 doses over 6 months, if 51 and younger. The vaccine is not recommended for use in pregnant females. However, pregnancy testing is not needed before receiving a dose.  Measles, mumps, rubella (MMR) vaccine.** / You need at least 1 dose of MMR if you were born in 1957 or later. You may also need a 2nd dose. For females of childbearing age, rubella immunity should be determined. If there is no evidence of immunity, females who are not pregnant should be vaccinated. If there is no evidence of immunity, females who are pregnant should delay immunization until after pregnancy.  Pneumococcal 13-valent conjugate (PCV13) vaccine.** / Consult your health care provider.  Pneumococcal polysaccharide (PPSV23) vaccine.** / 1 to 2 doses if you smoke cigarettes or if you have certain conditions.  Meningococcal vaccine.** / 1 dose if you are age 53 to 51 years and a Market researcher living in a residence hall, or have one of several medical conditions, you need to get vaccinated against meningococcal disease. You may also need additional booster doses.  Hepatitis A vaccine.** / Consult your health care provider.  Hepatitis B vaccine.** / Consult your health care provider.  Haemophilus influenzae type b (Hib) vaccine.** / Consult your health care provider.

## 2015-01-08 LAB — BASIC METABOLIC PANEL WITH GFR
BUN: 9 mg/dL (ref 7–25)
CALCIUM: 8.9 mg/dL (ref 8.6–10.2)
CO2: 26 mmol/L (ref 20–31)
Chloride: 102 mmol/L (ref 98–110)
Creat: 0.7 mg/dL (ref 0.50–1.10)
GFR, Est Non African American: >89 mL/min (ref >=60)
GLUCOSE: 90 mg/dL (ref 65–99)
POTASSIUM: 4.1 mmol/L (ref 3.5–5.3)
Sodium: 135 mmol/L (ref 135–146)

## 2015-01-08 LAB — CBC WITH DIFFERENTIAL/PLATELET
BASOS ABS: 0 10*3/uL (ref 0.0–0.1)
Basophils Relative: 0 % (ref 0–1)
EOS ABS: 0.2 10*3/uL (ref 0.0–0.7)
EOS PCT: 2 % (ref 0–5)
HCT: 41.1 % (ref 36.0–46.0)
Hemoglobin: 14.3 g/dL (ref 12.0–15.0)
LYMPHS ABS: 2.6 10*3/uL (ref 0.7–4.0)
Lymphocytes Relative: 30 % (ref 12–46)
MCH: 32.1 pg (ref 26.0–34.0)
MCHC: 34.8 g/dL (ref 30.0–36.0)
MCV: 92.2 fL (ref 78.0–100.0)
MONO ABS: 0.5 10*3/uL (ref 0.1–1.0)
MPV: 9.9 fL (ref 8.6–12.4)
Monocytes Relative: 6 % (ref 3–12)
Neutro Abs: 5.4 10*3/uL (ref 1.7–7.7)
Neutrophils Relative %: 62 % (ref 43–77)
PLATELETS: 261 10*3/uL (ref 150–400)
RBC: 4.46 MIL/uL (ref 3.87–5.11)
RDW: 13.5 % (ref 11.5–15.5)
WBC: 8.7 10*3/uL (ref 4.0–10.5)

## 2015-01-08 LAB — LIPID PANEL
CHOLESTEROL: 149 mg/dL (ref 125–200)
HDL: 45 mg/dL — ABNORMAL LOW (ref >=46)
LDL Cholesterol: 59 mg/dL (ref <130)
TRIGLYCERIDES: 224 mg/dL — AB (ref <150)
Total CHOL/HDL Ratio: 3.3 Ratio (ref <=5.0)
VLDL: 45 mg/dL — ABNORMAL HIGH (ref <30)

## 2015-01-08 LAB — URINALYSIS, ROUTINE W REFLEX MICROSCOPIC
BILIRUBIN URINE: NEGATIVE
GLUCOSE, UA: NEGATIVE
Ketones, ur: NEGATIVE
Leukocytes, UA: NEGATIVE
Nitrite: NEGATIVE
PH: 6.5 (ref 5.0–8.0)
PROTEIN: NEGATIVE
Specific Gravity, Urine: 1.021 (ref 1.001–1.035)

## 2015-01-08 LAB — TSH: TSH: 0.911 u[IU]/mL (ref 0.350–4.500)

## 2015-01-08 LAB — URINALYSIS, MICROSCOPIC ONLY
Bacteria, UA: NONE SEEN [HPF]
CASTS: NONE SEEN [LPF]
CRYSTALS: NONE SEEN [HPF]
WBC UA: NONE SEEN WBC/HPF (ref <=5)
Yeast: NONE SEEN [HPF]

## 2015-01-08 LAB — MAGNESIUM: MAGNESIUM: 2.2 mg/dL (ref 1.5–2.5)

## 2015-01-08 LAB — VITAMIN B12: VITAMIN B 12: 693 pg/mL (ref 211–911)

## 2015-01-08 LAB — HEPATIC FUNCTION PANEL
ALBUMIN: 4.5 g/dL (ref 3.6–5.1)
ALK PHOS: 56 U/L (ref 33–115)
ALT: 17 U/L (ref 6–29)
AST: 16 U/L (ref 10–30)
BILIRUBIN TOTAL: 0.3 mg/dL (ref 0.2–1.2)
Bilirubin, Direct: 0.1 mg/dL (ref <=0.2)
Indirect Bilirubin: 0.2 mg/dL (ref 0.2–1.2)
TOTAL PROTEIN: 7.9 g/dL (ref 6.1–8.1)

## 2015-01-08 LAB — MICROALBUMIN / CREATININE URINE RATIO
Creatinine, Urine: 168.6 mg/dL
MICROALB/CREAT RATIO: 6.5 mg/g (ref 0.0–30.0)
Microalb, Ur: 1.1 mg/dL (ref <2.0)

## 2015-01-08 LAB — HEMOGLOBIN A1C
Hgb A1c MFr Bld: 5.3 % (ref <5.7)
Mean Plasma Glucose: 105 mg/dL (ref <117)

## 2015-01-08 LAB — IRON AND TIBC
%SAT: 15 % (ref 11–50)
IRON: 54 ug/dL (ref 40–190)
TIBC: 358 ug/dL (ref 250–450)
UIBC: 304 ug/dL (ref 125–400)

## 2015-01-08 LAB — INSULIN, RANDOM: INSULIN: 38.6 u[IU]/mL — AB (ref 2.0–19.6)

## 2015-01-08 LAB — VITAMIN D 25 HYDROXY (VIT D DEFICIENCY, FRACTURES): Vit D, 25-Hydroxy: 33 ng/mL (ref 30–100)

## 2015-02-04 ENCOUNTER — Ambulatory Visit (INDEPENDENT_AMBULATORY_CARE_PROVIDER_SITE_OTHER): Payer: Commercial Managed Care - HMO | Admitting: Internal Medicine

## 2015-02-04 ENCOUNTER — Encounter: Payer: Self-pay | Admitting: Internal Medicine

## 2015-02-04 VITALS — BP 144/96 | HR 74 | Temp 98.0°F | Resp 18 | Ht 63.25 in | Wt 171.0 lb

## 2015-02-04 DIAGNOSIS — G4710 Hypersomnia, unspecified: Secondary | ICD-10-CM | POA: Diagnosis not present

## 2015-02-04 DIAGNOSIS — G4730 Sleep apnea, unspecified: Secondary | ICD-10-CM | POA: Diagnosis not present

## 2015-02-04 NOTE — Progress Notes (Signed)
Patient ID: Emily Nolan, female   DOB: [birth year], 39 y.o.   MRN: [id number]  Assessment and Plan:    1. Hypersomnia with sleep apnea -failed CPAP therapry -cont nuvigil -follow BP as it is borderline high -she is to call the office if it continues to be high       HPI 39 y.o.female presents for 1 month follow up of start nuvigil. Patient reports that they have been doing well.  female is taking their medication.  They are not having difficulty with their medications.  They report some adverse reactions.  She reports that as she started taking the medication she started feeling a little jittery and anxious.  She does note that she has more regular bowel movements.  She reports that she is a lot more alert and awake.  She does note that sometimes she forgets to eat on it but has been working on it.      Past Medical History  Diagnosis Date  . HSV-2 infection   . Benign hematuria     with negative GU workup     Allergies  Allergen Reactions  . Azithromycin Nausea Only      Current Outpatient Prescriptions on File Prior to Visit  Medication Sig Dispense Refill  . Armodafinil 150 MG tablet Take 1 tablet (150 mg total) by mouth daily. 30 tablet 1  . Cholecalciferol (VITAMIN D) 2000 UNITS CAPS Take by mouth.    . Magnesium 250 MG TABS Take by mouth.    . valACYclovir (VALTREX) 500 MG tablet Take 500 mg by mouth daily as needed.     . vitamin B-12 (CYANOCOBALAMIN) 100 MCG tablet Take 100 mcg by mouth daily.     No current facility-administered medications on file prior to visit.    ROS: all negative except above.   Physical Exam: Filed Weights   02/04/15 1128  Weight: 171 lb (77.565 kg)   Wt Readings from Last 3 Encounters:  02/04/15 171 lb (77.565 kg)  01/07/15 174 lb (78.926 kg)  05/19/14 176 lb (79.833 kg)    BP 144/96 mmHg  Pulse 74  Temp(Src) 98 F (36.7 C) (Temporal)  Resp 18  Ht 5' 3.25" (1.607 m)  Wt 171 lb (77.565 kg)  BMI 30.04 kg/m2  LMP  01/07/2015 General Appearance: Well developed well nourished, non-toxic appearing in no apparent distress. Eyes: PERRLA, EOMs, conjunctiva w/ no swelling or erythema or discharge Sinuses: No Frontal/maxillary tenderness ENT/Mouth: Ear canals clear without swelling or erythema.  TM's normal bilaterally with no retractions, bulging, or loss of landmarks.   Neck: Supple, thyroid normal, no notable JVD  Respiratory: Respiratory effort normal, Clear breath sounds anteriorly and posteriorly bilaterally without rales, rhonchi, wheezing or stridor. No retractions or accessory muscle usage. Cardio: RRR with no MRGs.   Abdomen: Soft, + BS.  Non tender, no guarding, rebound, hernias, masses.  Musculoskeletal: Full ROM, 5/5 strength, normal gait.  Skin: Warm, dry without rashes  Neuro: Awake and oriented X 3, Cranial nerves intact. Normal muscle tone, no cerebellar symptoms. Sensation intact.  Psych: normal affect, Insight and Judgment appropriate.     Starlyn Skeans, PA-C 11:39 AM [facility]

## 2015-02-05 ENCOUNTER — Ambulatory Visit: Payer: Self-pay | Admitting: Internal Medicine

## 2015-02-16 ENCOUNTER — Encounter: Payer: Self-pay | Admitting: Physician Assistant

## 2015-02-16 ENCOUNTER — Ambulatory Visit (INDEPENDENT_AMBULATORY_CARE_PROVIDER_SITE_OTHER): Payer: Commercial Managed Care - HMO | Admitting: Physician Assistant

## 2015-02-16 VITALS — BP 140/100 | HR 72 | Temp 97.7°F | Resp 16 | Ht 63.25 in | Wt 170.0 lb

## 2015-02-16 DIAGNOSIS — J028 Acute pharyngitis due to other specified organisms: Principal | ICD-10-CM

## 2015-02-16 DIAGNOSIS — J029 Acute pharyngitis, unspecified: Secondary | ICD-10-CM

## 2015-02-16 DIAGNOSIS — B9789 Other viral agents as the cause of diseases classified elsewhere: Secondary | ICD-10-CM

## 2015-02-16 DIAGNOSIS — I10 Essential (primary) hypertension: Secondary | ICD-10-CM | POA: Diagnosis not present

## 2015-02-16 MED ORDER — AMOXICILLIN-POT CLAVULANATE 875-125 MG PO TABS: 1.0000 | ORAL_TABLET | Freq: Two times a day (BID) | ORAL | Status: DC

## 2015-02-16 NOTE — Patient Instructions (Signed)
Use a dropper or use a cap to put olive oil,mineral oil or canola oil in the effected ear- 2-3 times a week. Let it soak for 20-30 min then you can take a shower or use a baby bulb with warm water to wash out the ear wax.  Do not use Qtips  Monitor your blood pressure at home. Go to the ER if any CP, SOB, nausea, dizziness, severe HA, changes vision/speech  Goal BP:  For patients younger than 60: Goal BP < 140/90. For patients 60 and older: Goal BP < 150/90. For patients with diabetes: Goal BP < 140/90. Your most recent BP: BP: (!) 140/100 mmHg   Take your medications faithfully as instructed. Maintain a healthy weight. Get at least 150 minutes of aerobic exercise per week. Minimize salt intake. Minimize alcohol intake  DASH Eating Plan DASH stands for "Dietary Approaches to Stop Hypertension." The DASH eating plan is a healthy eating plan that has been shown to reduce high blood pressure (hypertension). Additional health benefits may include reducing the risk of type 2 diabetes mellitus, heart disease, and stroke. The DASH eating plan may also help with weight loss. WHAT DO I NEED TO KNOW ABOUT THE DASH EATING PLAN? For the DASH eating plan, you will follow these general guidelines:  Choose foods with a percent daily value for sodium of less than 5% (as listed on the food label).  Use salt-free seasonings or herbs instead of table salt or sea salt.  Check with your health care provider or pharmacist before using salt substitutes.  Eat lower-sodium products, often labeled as "lower sodium" or "no salt added."  Eat fresh foods.  Eat more vegetables, fruits, and low-fat dairy products.  Choose whole grains. Look for the word "whole" as the first word in the ingredient list.  Choose fish and skinless chicken or [place] more often than red meat. Limit fish, poultry, and meat to 6 oz (170 g) each day.  Limit sweets, desserts, sugars, and sugary drinks.  Choose heart-healthy  fats.  Limit cheese to 1 oz (28 g) per day.  Eat more home-cooked food and less restaurant, buffet, and fast food.  Limit fried foods.  Cook foods using methods other than frying.  Limit canned vegetables. If you do use them, rinse them well to decrease the sodium.  When eating at a restaurant, ask that your food be prepared with less salt, or no salt if possible. WHAT FOODS CAN I EAT? Seek help from a dietitian for individual calorie needs. Grains Whole grain or whole wheat bread. Brown rice. Whole grain or whole wheat pasta. Quinoa, bulgur, and whole grain cereals. Low-sodium cereals. Corn or whole wheat flour tortillas. Whole grain cornbread. Whole grain crackers. Low-sodium crackers. Vegetables Fresh or frozen vegetables (raw, steamed, roasted, or grilled). Low-sodium or reduced-sodium tomato and vegetable juices. Low-sodium or reduced-sodium tomato sauce and paste. Low-sodium or reduced-sodium canned vegetables.  Fruits All fresh, canned (in natural juice), or frozen fruits. Meat and Other Protein Products Ground beef (85% or leaner), grass-fed beef, or beef trimmed of fat. Skinless chicken or [place]. Ground chicken or [place]. Pork trimmed of fat. All fish and seafood. Eggs. Dried beans, peas, or lentils. Unsalted nuts and seeds. Unsalted canned beans. Dairy Low-fat dairy products, such as skim or 1% milk, 2% or reduced-fat cheeses, low-fat ricotta or cottage cheese, or plain low-fat yogurt. Low-sodium or reduced-sodium cheeses. Fats and Oils Tub margarines without trans fats. Light or reduced-fat mayonnaise and salad dressings (reduced sodium). Avocado.  Safflower, olive, or canola oils. Natural peanut or almond butter. Other Unsalted popcorn and pretzels. The items listed above may not be a complete list of recommended foods or beverages. Contact your dietitian for more options. WHAT FOODS ARE NOT RECOMMENDED? Grains White bread. White pasta. White rice. Refined cornbread.  Bagels and croissants. Crackers that contain trans fat. Vegetables Creamed or fried vegetables. Vegetables in a cheese sauce. Regular canned vegetables. Regular canned tomato sauce and paste. Regular tomato and vegetable juices. Fruits Dried fruits. Canned fruit in light or heavy syrup. Fruit juice. Meat and Other Protein Products Fatty cuts of meat. Ribs, chicken wings, bacon, sausage, bologna, salami, chitterlings, fatback, hot dogs, bratwurst, and packaged luncheon meats. Salted nuts and seeds. Canned beans with salt. Dairy Whole or 2% milk, cream, half-and-half, and cream cheese. Whole-fat or sweetened yogurt. Full-fat cheeses or blue cheese. Nondairy creamers and whipped toppings. Processed cheese, cheese spreads, or cheese curds. Condiments Onion and garlic salt, seasoned salt, table salt, and sea salt. Canned and packaged gravies. Worcestershire sauce. Tartar sauce. Barbecue sauce. Teriyaki sauce. Soy sauce, including reduced sodium. Steak sauce. Fish sauce. Oyster sauce. Cocktail sauce. Horseradish. Ketchup and mustard. Meat flavorings and tenderizers. Bouillon cubes. Hot sauce. Tabasco sauce. Marinades. Taco seasonings. Relishes. Fats and Oils Butter, stick margarine, lard, shortening, ghee, and bacon fat. Coconut, palm kernel, or palm oils. Regular salad dressings. Other Pickles and olives. Salted popcorn and pretzels. The items listed above may not be a complete list of foods and beverages to avoid. Contact your dietitian for more information. WHERE CAN I FIND MORE INFORMATION? National Heart, Lung, and Blood Institute: travelstabloid.com Document Released: 03/17/2011 Document Revised: 08/12/2013 Document Reviewed: 01/30/2013 [facility] Patient Information 2015 [place], [place]. This information is not intended to replace advice given to you by your health care provider. Make sure you discuss any questions you have with your health care provider.   -Make  sure you are drinking plenty of fluids to stay hydrated.  -while drinking fluids pinch and hold nose close and swallow, to help open eustachian tubes and drain fluid behind ear drums. -you can do salt water gargles. You can also do 1 TSP liquid Maalox and 1 TSP liquid benadryl- mix/ gargle/ spit  If you are not feeling better in 10-14 days, then please call the office.  Pharyngitis Pharyngitis is redness, pain, and swelling (inflammation) of your pharynx.  CAUSES  Pharyngitis is usually caused by infection. Most of the time, these infections are from viruses (viral) and are part of a cold. However, sometimes pharyngitis is caused by bacteria (bacterial). Pharyngitis can also be caused by allergies. Viral pharyngitis may be spread from person to person by coughing, sneezing, and personal items or utensils (cups, forks, spoons, toothbrushes). Bacterial pharyngitis may be spread from person to person by more intimate contact, such as kissing.  SIGNS AND SYMPTOMS  Symptoms of pharyngitis include:   Sore throat.   Tiredness (fatigue).   Low-grade fever.   Headache.  Joint pain and muscle aches.  Skin rashes.  Swollen lymph nodes.  Plaque-like film on throat or tonsils (often seen with bacterial pharyngitis). DIAGNOSIS  Your health care provider will ask you questions about your illness and your symptoms. Your medical history, along with a physical exam, is often all that is needed to diagnose pharyngitis. Sometimes, a rapid strep test is done. Other lab tests may also be done, depending on the suspected cause.  TREATMENT  Viral pharyngitis will usually get better in 3-4 days without the use  of medicine. Bacterial pharyngitis is treated with medicines that kill germs (antibiotics).  HOME CARE INSTRUCTIONS   Drink enough water and fluids to keep your urine clear or pale yellow.   Only take over-the-counter or prescription medicines as directed by your health care  provider:   If you are prescribed antibiotics, make sure you finish them even if you start to feel better.   Do not take aspirin.   Get lots of rest.   Gargle with 8 oz of salt water ( tsp of salt per 1 qt of water) as often as every 1-2 hours to soothe your throat.   Throat lozenges (if you are not at risk for choking) or sprays may be used to soothe your throat. SEEK MEDICAL CARE IF:   You have large, tender lumps in your neck.  You have a rash.  You cough up green, yellow-brown, or bloody spit. SEEK IMMEDIATE MEDICAL CARE IF:   Your neck becomes stiff.  You drool or are unable to swallow liquids.  You vomit or are unable to keep medicines or liquids down.  You have severe pain that does not go away with the use of recommended medicines.  You have trouble breathing (not caused by a stuffy nose). MAKE SURE YOU:   Understand these instructions.  Will watch your condition.  Will get help right away if you are not doing well or get worse. Document Released: 03/28/2005 Document Revised: 01/16/2013 Document Reviewed: 12/03/2012 [facility] Patient Information 2015 [place], [place]. This information is not intended to replace advice given to you by your health care provider. Make sure you discuss any questions you have with your health care provider.

## 2015-02-16 NOTE — Progress Notes (Signed)
Rx called into [facility] on [address].

## 2015-02-16 NOTE — Progress Notes (Signed)
   Subjective:    Patient ID: Emily Nolan, female    DOB: 1975/07/20, 39 y.o.   MRN: [id number]  HPI 39 y.o. nonsmoking female with history of HTN, OSA presents with sore throat x Thursday, worse last 2 days. Painful to talk, eat, with some fever and chills last night. Has 3 kids, has been exposed to sick contacts. Has been taking dayquil/iburpfen.   BP Readings from Last 3 Encounters:  02/16/15 140/100  02/04/15 144/96  01/07/15 150/88     Blood pressure 140/100, pulse 72, temperature 97.7 F (36.5 C), temperature source Temporal, resp. rate 16, height 5' 3.25" (1.607 m), weight 170 lb (77.111 kg), last menstrual period 02/04/2015, SpO2 98 %.  Current Outpatient Prescriptions on File Prior to Visit  Medication Sig Dispense Refill  . Armodafinil 150 MG tablet Take 1 tablet (150 mg total) by mouth daily. 30 tablet 1  . Cholecalciferol (VITAMIN D) 2000 UNITS CAPS Take by mouth.    . Magnesium 250 MG TABS Take by mouth.    . valACYclovir (VALTREX) 500 MG tablet Take 500 mg by mouth daily as needed.     . vitamin B-12 (CYANOCOBALAMIN) 100 MCG tablet Take 100 mcg by mouth daily.     No current facility-administered medications on file prior to visit.   Past Medical History  Diagnosis Date  . HSV-2 infection   . Benign hematuria     with negative GU workup    Review of Systems  Constitutional: Positive for chills. Negative for diaphoresis.  HENT: Positive for congestion, ear pain, sinus pressure and sore throat. Negative for sneezing.   Respiratory: Negative.  Negative for cough and shortness of breath.   Cardiovascular: Negative.   Musculoskeletal: Positive for neck pain.  Neurological: Negative.  Negative for headaches.       Objective:   Physical Exam  Constitutional: She appears well-developed and well-nourished.  HENT:  Head: Normocephalic and atraumatic.  Right Ear: External ear normal.  Left Ear: External ear normal.  Mouth/Throat: Uvula is midline and mucous  membranes are normal. Posterior oropharyngeal edema and posterior oropharyngeal erythema present.  Eyes: Conjunctivae and EOM are normal. Pupils are equal, round, and reactive to light.  Neck: Normal range of motion. Neck supple.  Cardiovascular: Normal rate, regular rhythm and normal heart sounds.   Pulmonary/Chest: Effort normal and breath sounds normal.  Abdominal: Soft. Bowel sounds are normal.  Lymphadenopathy:    She has cervical adenopathy.  Skin: Skin is warm and dry.      Assessment & Plan:  1. Sore throat (viral) Pharyngitis: take medicationss as prescribed, increase fluids,  Salt water gargles. If symptoms do not improve in 5-7 days or get worse contact the office or go to ER. - amoxicillin-clavulanate (AUGMENTIN) 875-125 MG tablet; Take 1 tablet by mouth 2 (two) times daily. 10 days  Dispense: 20 tablet; Refill: 0  2. Essential hypertension - declines medications, will monitor at home, DASH diet, exercise and monitor at home. Call if greater than 130/80.

## 2015-05-07 ENCOUNTER — Ambulatory Visit (INDEPENDENT_AMBULATORY_CARE_PROVIDER_SITE_OTHER): Payer: Managed Care, Other (non HMO) | Admitting: Internal Medicine

## 2015-05-07 ENCOUNTER — Encounter: Payer: Self-pay | Admitting: Internal Medicine

## 2015-05-07 ENCOUNTER — Ambulatory Visit: Payer: Self-pay | Admitting: Internal Medicine

## 2015-05-07 VITALS — BP 128/86 | HR 80 | Temp 98.4°F | Resp 18 | Ht 63.25 in | Wt 169.0 lb

## 2015-05-07 DIAGNOSIS — G4710 Hypersomnia, unspecified: Secondary | ICD-10-CM | POA: Diagnosis not present

## 2015-05-07 DIAGNOSIS — G4730 Sleep apnea, unspecified: Secondary | ICD-10-CM

## 2015-05-07 MED ORDER — ARMODAFINIL 150 MG PO TABS: 150.0000 mg | ORAL_TABLET | Freq: Every day | ORAL | Status: DC

## 2015-05-07 NOTE — Progress Notes (Signed)
   Subjective:    Patient ID: Emily Nolan, female    DOB: Jun 27, 1975, 40 y.o.   MRN: [id number]  HPI  Patient reports to the office for recheck of provigil.  She takes it as an as needed basis for hypersomnolence secondary to OSA.  She reports that she hasn't been taking a lot of it lately.  She does well with a half tablet.  She sometimes takes a full tablet as needed.  She reports that it can curb her appetite.  She tries to eat before taking it.    Review of Systems  Constitutional: Negative for fever, chills and fatigue.  Respiratory: Negative for chest tightness and shortness of breath.   Cardiovascular: Negative for chest pain and palpitations.  Gastrointestinal: Positive for diarrhea. Negative for nausea, vomiting, abdominal pain, constipation, blood in stool and anal bleeding.  Genitourinary: Negative for dysuria, urgency, hematuria and difficulty urinating.  Neurological: Negative for dizziness, weakness and light-headedness.  Psychiatric/Behavioral: Negative for confusion, sleep disturbance, dysphoric mood, decreased concentration and agitation. The patient is not hyperactive.        Objective:   Physical Exam  Constitutional: She is oriented to person, place, and time. She appears well-developed and well-nourished. No distress.  HENT:  Head: Normocephalic.  Mouth/Throat: Oropharynx is clear and moist. No oropharyngeal exudate.  Eyes: Conjunctivae are normal. No scleral icterus.  Neck: Normal range of motion. Neck supple. No JVD present. No thyromegaly present.  Cardiovascular: Normal rate, regular rhythm, normal heart sounds and intact distal pulses.  Exam reveals no gallop and no friction rub.   No murmur heard. Pulmonary/Chest: Effort normal and breath sounds normal. No respiratory distress. She has no wheezes. She has no rales. She exhibits no tenderness.  Abdominal: Soft. Bowel sounds are normal. She exhibits no distension and no mass. There is no tenderness. There is  no rebound and no guarding.  Musculoskeletal: Normal range of motion.  Lymphadenopathy:    She has no cervical adenopathy.  Neurological: She is alert and oriented to person, place, and time.  Skin: Skin is warm and dry. She is not diaphoretic.  Psychiatric: She has a normal mood and affect. Her behavior is normal. Judgment and thought content normal.  Nursing note and vitals reviewed.    Filed Vitals:   05/07/15 1506  BP: 128/86  Pulse: 80  Temp: 98.4 F (36.9 C)  Resp: 18        Assessment & Plan:    1. Hypersomnolence -cont meds -using medication very sparingly -no abuse per history

## 2015-08-08 IMAGING — CR DG HIP COMPLETE 2+V*R*
3 series · 3 of 3 positions shown · non-contrast
Comparison: None.

CLINICAL DATA: Status post fall onto right hip; right hip pain.

RIGHT HIP - COMPLETE 2+ VIEW

[t pelvis ap]
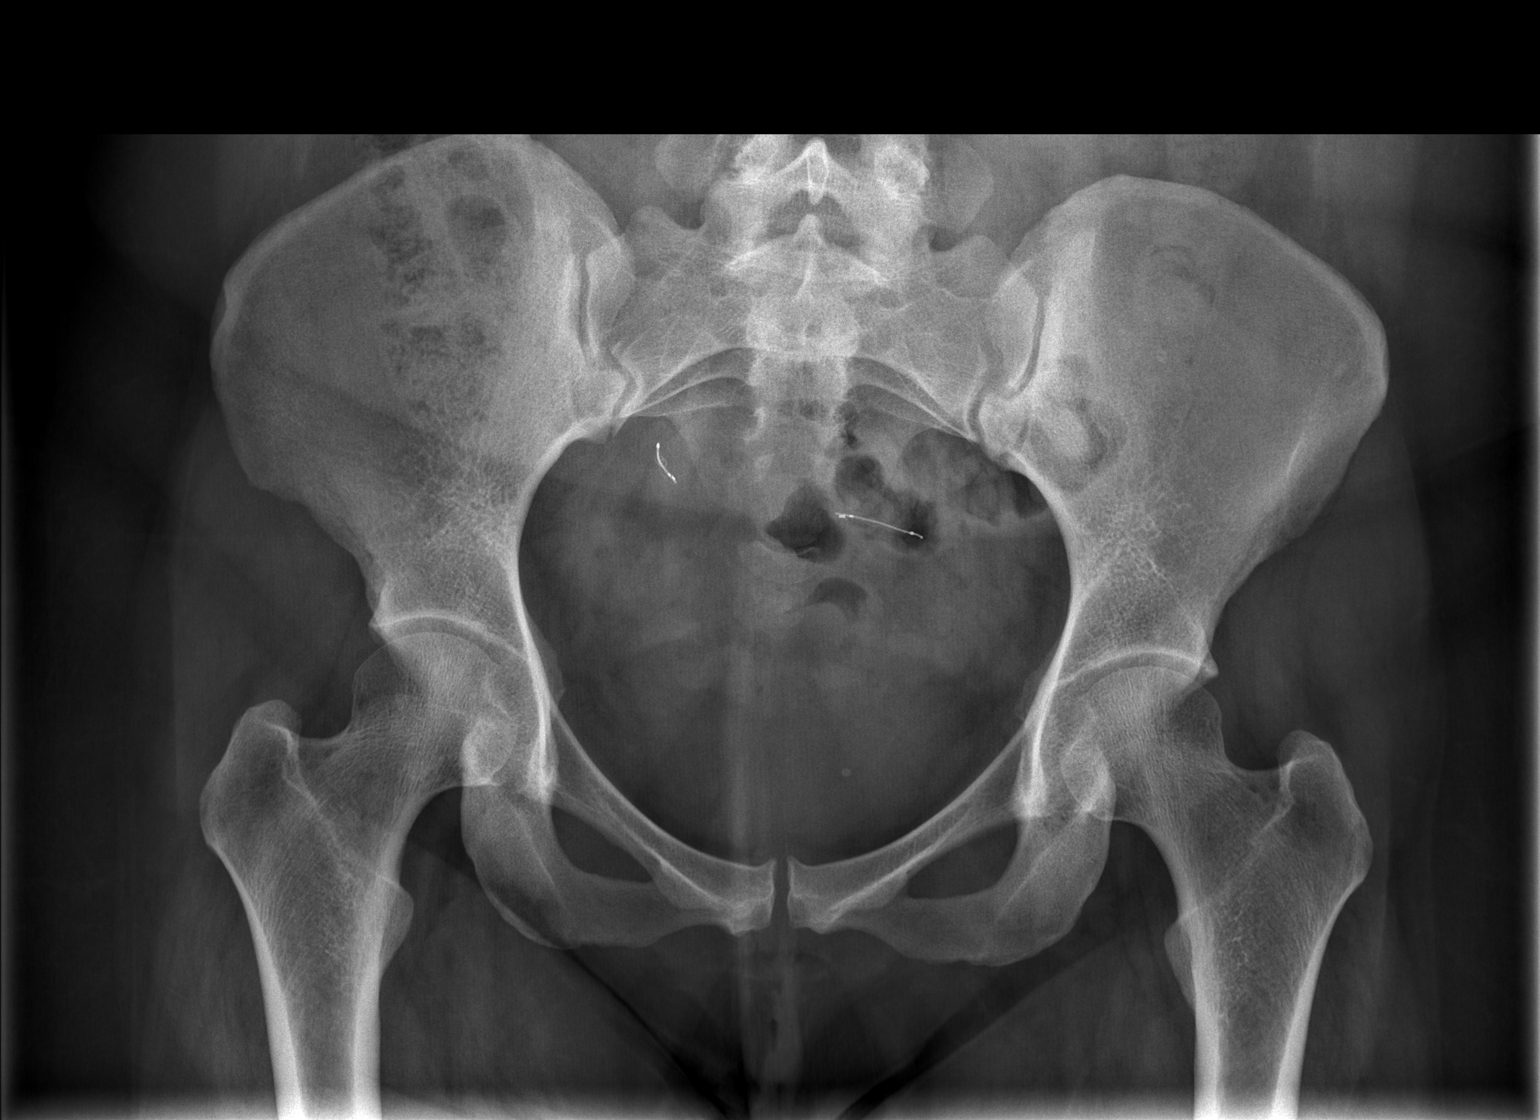

[t hip ap right]
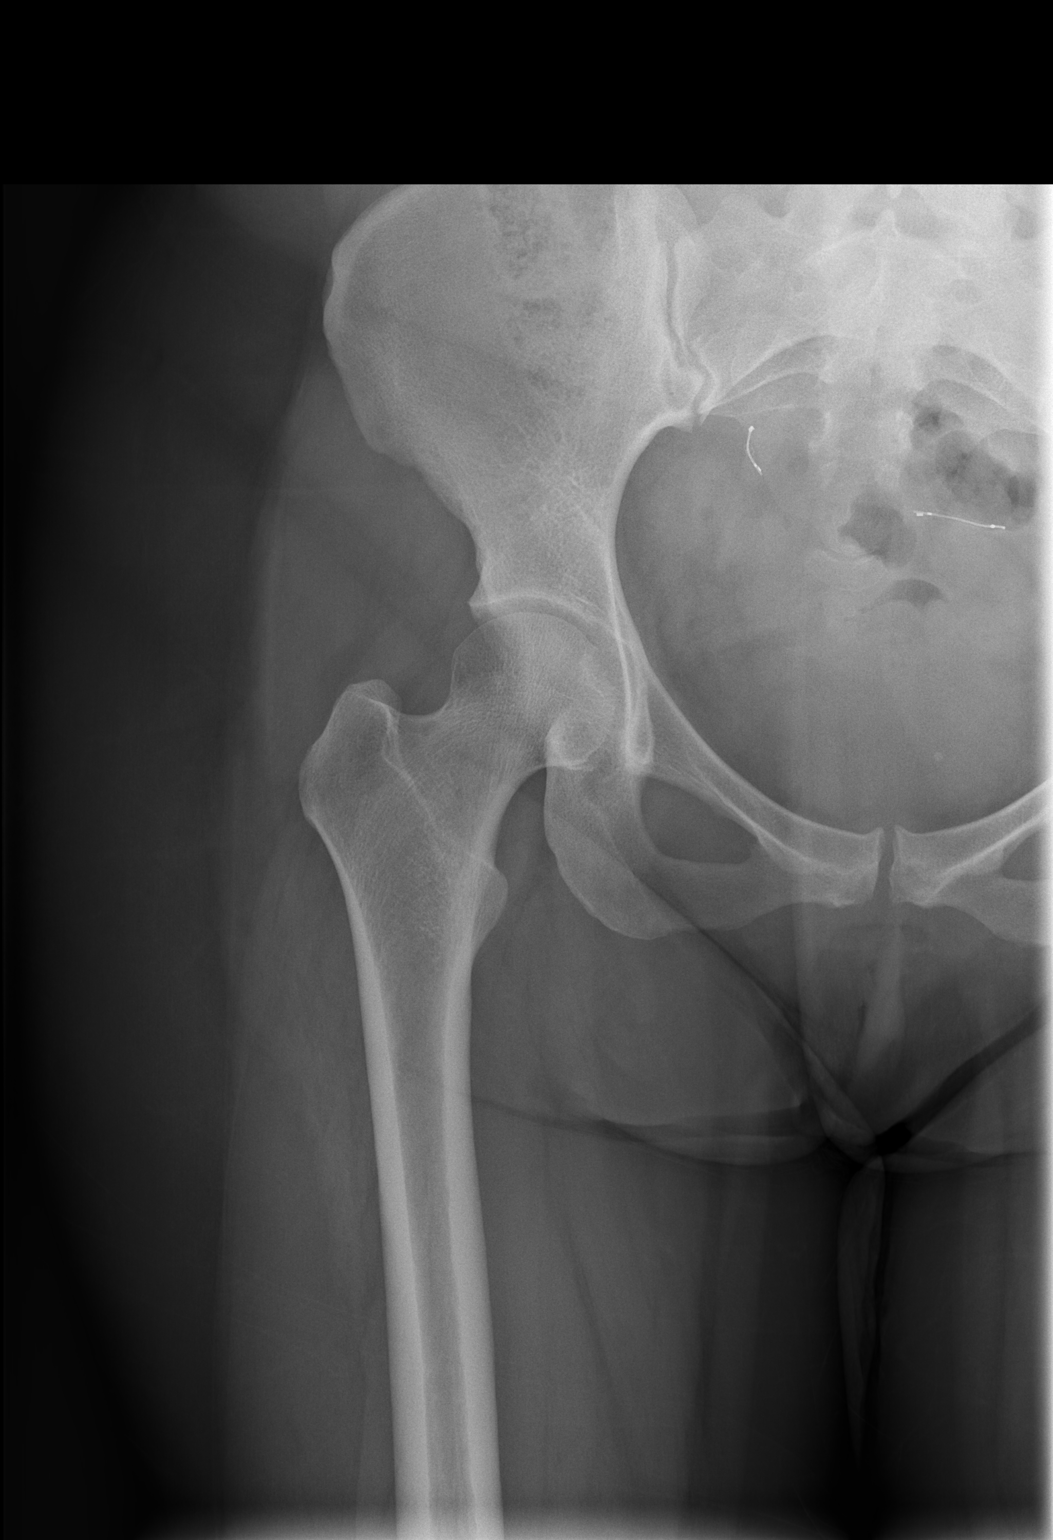

[t hip frog leg right]
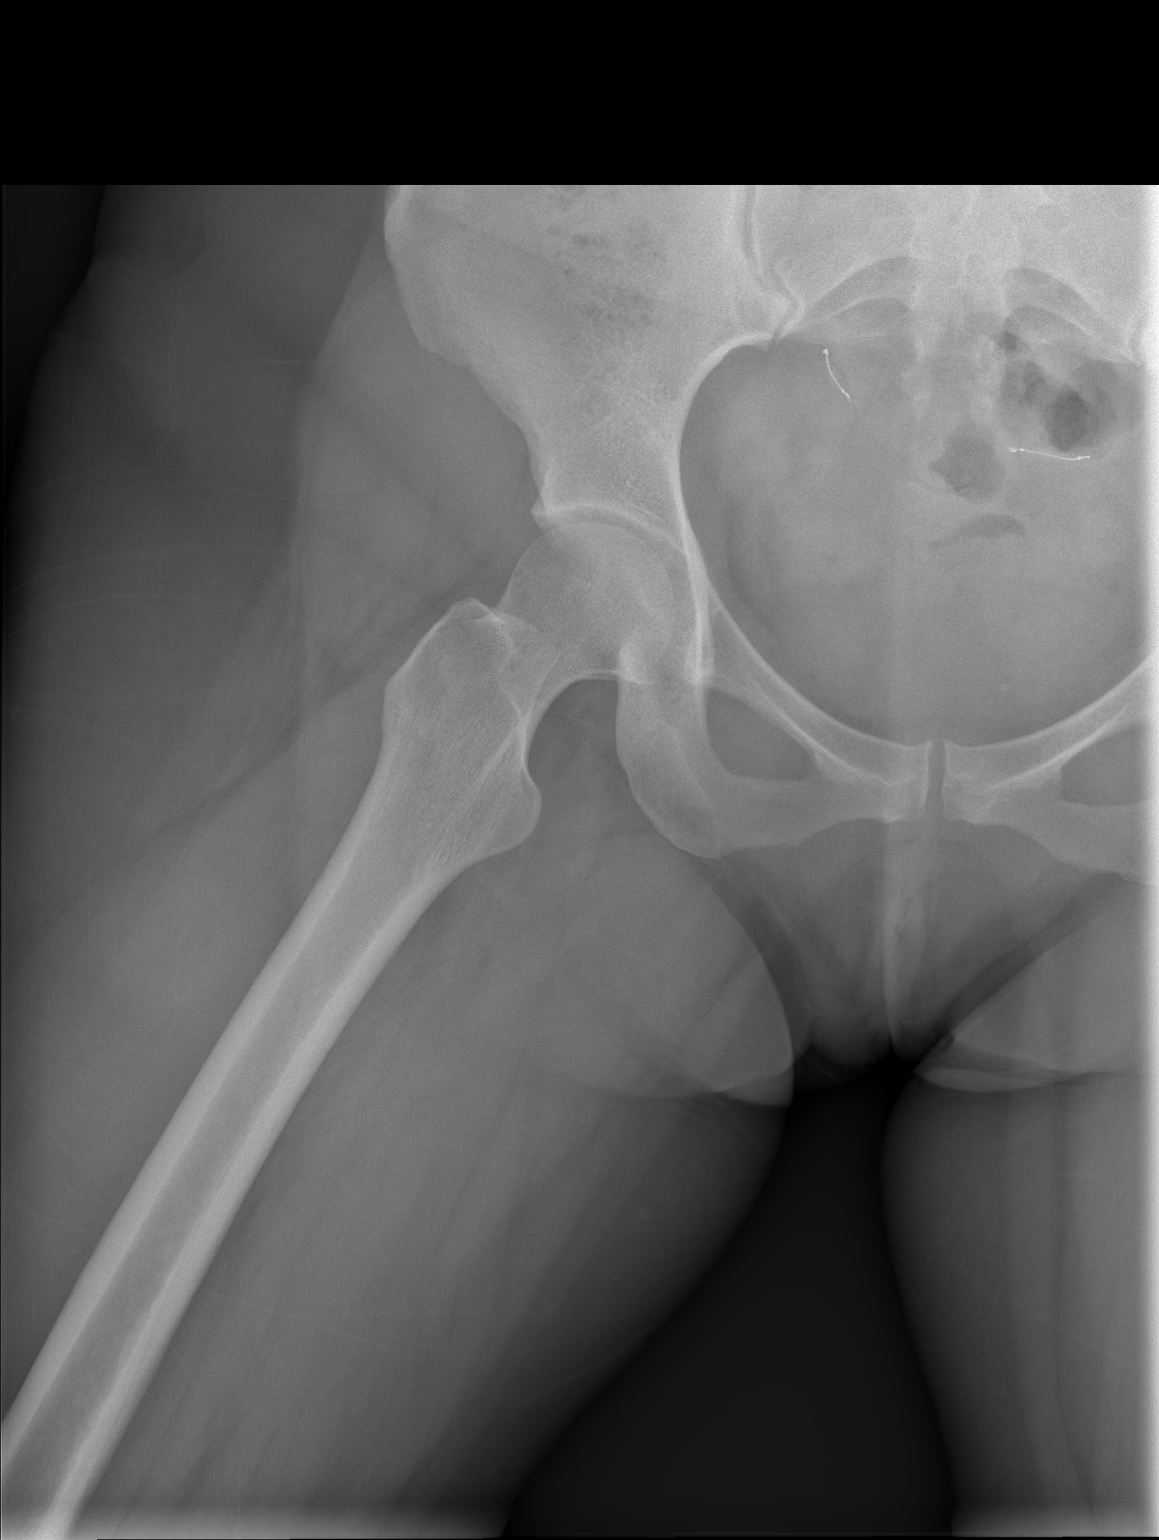

[3 of 3 positions shown; findings below may reference images not displayed]

FINDINGS: There is no evidence of fracture or dislocation.  Both
femoral heads are seated normally within their respective
acetabula.  The proximal right femur appears intact.  No
significant degenerative change is appreciated.  The sacroiliac
joints are unremarkable in appearance.

The visualized bowel gas pattern is grossly unremarkable in
appearance.  Bilateral essure wires are noted.
IMPRESSION: No evidence of fracture or dislocation.

## 2015-11-11 ENCOUNTER — Encounter: Payer: Self-pay | Admitting: Internal Medicine

## 2015-11-11 ENCOUNTER — Ambulatory Visit (INDEPENDENT_AMBULATORY_CARE_PROVIDER_SITE_OTHER): Payer: Managed Care, Other (non HMO) | Admitting: Internal Medicine

## 2015-11-11 VITALS — BP 126/76 | HR 61 | Temp 97.3°F | Ht 63.25 in | Wt 178.6 lb

## 2015-11-11 DIAGNOSIS — L03313 Cellulitis of chest wall: Secondary | ICD-10-CM | POA: Diagnosis not present

## 2015-11-11 MED ORDER — FLUCONAZOLE 150 MG PO TABS: 150.0000 mg | ORAL_TABLET | Freq: Once | ORAL | 0 refills | Status: AC

## 2015-11-11 MED ORDER — FLUCONAZOLE 150 MG PO TABS: 150.0000 mg | ORAL_TABLET | Freq: Once | ORAL | 0 refills | Status: DC

## 2015-11-11 MED ORDER — DOXYCYCLINE HYCLATE 100 MG PO CAPS: 100.0000 mg | ORAL_CAPSULE | Freq: Two times a day (BID) | ORAL | 0 refills | Status: DC

## 2015-11-11 NOTE — Addendum Note (Signed)
Addended by: Starlyn Skeans A on: 11/11/2015 04:27 PM   Modules accepted: Orders

## 2015-11-11 NOTE — Progress Notes (Addendum)
   Subjective:    Patient ID: Emily Nolan, female    DOB: [birth year], 40 y.o.   MRN: [id number]  HPI  Patient presents to the office for evaluation of left breast bump.  This has been present 3 weeks.  No drainage.  No palpable warmth.  Tender to palpation.  No fevers, chills, nausea or vomiting.  She is having some diarrhea.  She has never had abscesses in the past.  No bites that she can remember.     Review of Systems  Constitutional: Negative for chills, fatigue and fever.  Gastrointestinal: Positive for diarrhea. Negative for constipation, nausea and vomiting.  Skin: Positive for rash and wound. Negative for color change and pallor.       Objective:   Physical Exam  Constitutional: She appears well-developed and well-nourished. No distress.  HENT:  Head: Normocephalic.  Mouth/Throat: Oropharynx is clear and moist. No oropharyngeal exudate.  Eyes: Conjunctivae are normal. No scleral icterus.  Neck: Normal range of motion. Neck supple. No JVD present. No thyromegaly present.  Cardiovascular: Normal rate, regular rhythm, normal heart sounds and intact distal pulses.  Exam reveals no gallop.   No murmur heard. Pulmonary/Chest: Effort normal and breath sounds normal. No respiratory distress. She has no wheezes. She has no rales. She exhibits no tenderness. Right breast exhibits no inverted nipple, no mass, no nipple discharge, no skin change and no tenderness. Left breast exhibits no inverted nipple, no mass, no nipple discharge, no skin change and no tenderness. Breasts are symmetrical.    Lymphadenopathy:    She has no cervical adenopathy.  Skin: She is not diaphoretic.  Nursing note and vitals reviewed.   Vitals:   11/11/15 1420  BP: 126/76  Pulse: 61  Temp: 97.3 F (36.3 C)          Assessment & Plan:    1. Cellulitis of chest wall -doxycycline -warm compresses -return to office if no improvement

## 2015-11-11 NOTE — Patient Instructions (Signed)

## 2015-11-16 ENCOUNTER — Encounter: Payer: Self-pay | Admitting: Internal Medicine

## 2015-11-23 ENCOUNTER — Other Ambulatory Visit: Payer: Self-pay | Admitting: Internal Medicine

## 2015-11-23 DIAGNOSIS — R21 Rash and other nonspecific skin eruption: Secondary | ICD-10-CM

## 2015-11-23 MED ORDER — CLOTRIMAZOLE-BETAMETHASONE 1-0.05 % EX CREA: TOPICAL_CREAM | CUTANEOUS | 1 refills | Status: DC

## 2015-11-23 NOTE — Progress Notes (Signed)
Patient called with continued rash on left breast after finishing doxycycline.  Will send in lotrisone cream and will set up for mammogram and breast ultrasound to look for mass/abscess.  Patient did not answer phone when I called so asked her to return our call tomorrow.

## 2015-11-24 ENCOUNTER — Other Ambulatory Visit: Payer: Self-pay | Admitting: Internal Medicine

## 2015-11-24 DIAGNOSIS — N61 Mastitis without abscess: Secondary | ICD-10-CM

## 2015-11-27 ENCOUNTER — Other Ambulatory Visit: Payer: 59

## 2015-12-02 ENCOUNTER — Ambulatory Visit
Admission: RE | Admit: 2015-12-02 | Discharge: 2015-12-02 | Disposition: A | Discharge status: Home / Self Care | Payer: Managed Care, Other (non HMO) | Source: Ambulatory Visit | Attending: Internal Medicine | Admitting: Internal Medicine

## 2015-12-02 DIAGNOSIS — N61 Mastitis without abscess: Secondary | ICD-10-CM

## 2015-12-04 ENCOUNTER — Other Ambulatory Visit: Payer: Self-pay | Admitting: Internal Medicine

## 2015-12-04 DIAGNOSIS — N6009 Solitary cyst of unspecified breast: Secondary | ICD-10-CM

## 2015-12-18 ENCOUNTER — Other Ambulatory Visit: Payer: Self-pay | Admitting: General Surgery

## 2015-12-23 ENCOUNTER — Encounter: Payer: Self-pay | Admitting: Internal Medicine

## 2016-01-07 ENCOUNTER — Encounter: Payer: Self-pay | Admitting: Internal Medicine

## 2016-01-07 ENCOUNTER — Encounter (HOSPITAL_COMMUNITY): Payer: Self-pay

## 2016-01-07 ENCOUNTER — Encounter (HOSPITAL_COMMUNITY)
Admission: RE | Admit: 2016-01-07 | Discharge: 2016-01-07 | Disposition: A | Discharge status: Home / Self Care | Payer: Managed Care, Other (non HMO) | Source: Ambulatory Visit | Attending: General Surgery | Admitting: General Surgery

## 2016-01-07 DIAGNOSIS — Z01818 Encounter for other preprocedural examination: Secondary | ICD-10-CM | POA: Insufficient documentation

## 2016-01-07 DIAGNOSIS — Z3202 Encounter for pregnancy test, result negative: Secondary | ICD-10-CM | POA: Diagnosis not present

## 2016-01-07 LAB — BASIC METABOLIC PANEL
ANION GAP: 10 (ref 5–15)
BUN: 6 mg/dL (ref 6–20)
CHLORIDE: 105 mmol/L (ref 101–111)
CO2: 22 mmol/L (ref 22–32)
Calcium: 9.2 mg/dL (ref 8.9–10.3)
Creatinine, Ser: 0.54 mg/dL (ref 0.44–1.00)
GFR calc Af Amer: >60 mL/min (ref >60)
Glucose, Bld: 81 mg/dL (ref 65–99)
POTASSIUM: 3.5 mmol/L (ref 3.5–5.1)
Sodium: 137 mmol/L (ref 135–145)

## 2016-01-07 LAB — CBC
HEMATOCRIT: 39.7 % (ref 36.0–46.0)
HEMOGLOBIN: 13.5 g/dL (ref 12.0–15.0)
MCH: 30.5 pg (ref 26.0–34.0)
MCHC: 34 g/dL (ref 30.0–36.0)
MCV: 89.8 fL (ref 78.0–100.0)
Platelets: 229 10*3/uL (ref 150–400)
RBC: 4.42 MIL/uL (ref 3.87–5.11)
RDW: 12.8 % (ref 11.5–15.5)
WBC: 6.9 10*3/uL (ref 4.0–10.5)

## 2016-01-07 LAB — HCG, SERUM, QUALITATIVE: PREG SERUM: NEGATIVE

## 2016-01-07 MED ORDER — CHLORHEXIDINE GLUCONATE CLOTH 2 % EX PADS: 6.0000 | MEDICATED_PAD | Freq: Once | CUTANEOUS | Status: DC

## 2016-01-07 NOTE — Pre-Procedure Instructions (Signed)
    Emily Nolan  01/07/2016      [id number] [address], [place] [address] [place] [address] Phone: [phone] Fax: [phone]    Your procedure is scheduled on October 5.  Report to [facility] Admitting at 5:30 A.M.  Call this number if you have problems the morning of surgery:  [phone]   Remember:  Do not eat food or drink liquids after midnight.  Take these medicines the morning of surgery with A SIP OF WATER : Armodafinil  STOP aspirin, vitamins, fish oil, herbal medicaitons   Do not wear jewelry, make-up or nail polish.  Do not wear lotions, powders, or perfumes, or deoderant.  Do not shave 48 hours prior to surgery.  Men may shave face and neck.  Do not bring valuables to the hospital.  [facility] is not responsible for any belongings or valuables.  Contacts, dentures or bridgework may not be worn into surgery.  Leave your suitcase in the car.  After surgery it may be brought to your room.  For patients admitted to the hospital, discharge time will be determined by your treatment team.  Patients discharged the day of surgery will not be allowed to drive home.   Name and phone number of your driver:    Special instructions:

## 2016-01-13 NOTE — H&P (Signed)
Chae A. Muresan 12/18/2015 9:45 AM Location: [facility] Patient #: [id number] DOB: March 13, 1976 Married / Language: Cleophus Molt / Race: White Female   History of Present Illness Stark Klein MD; 12/18/2015 10:53 AM) Patient words: new pt, inclusion cyst, left breast.  The patient is a 40 year old female who presents with a breast mass. Patient is a 40 year old female who is referred for consultation for a left breast mass by Dr. Melford Aase. The patient developed a bump underneath her left nipple that was painful and had some surrounding redness. She was seen and placed on antibiotics. Mammogram and ultrasound were obtained. This demonstrated a 1.4 cm subcutaneous complicated cyst at [id number] 3 cm from the nipple. This was considered BI-RADS 2 and likely to be a sebaceous or epidermoid cyst. The patient developed spontaneous drainage over the weekend. She said it was mostly clear fluid and some bloody discharge. The pain has gotten significantly better. She denies fevers and chills. She states that the breast was sore, but more irritating for her than excruciatingly sore.   mm/us Targeted ultrasound is performed, showing a 0.8 x 0.2 x 1.4 cm subcutaneous complicated cyst at the 6 o'clock position of the left breast 3 cm from the nipple.  Normal fibroglandular tissue in the upper outer left breast corresponds to the focal asymmetry.  IMPRESSION: Benign 0.8 x 0.2 x 0.4 cm complicated cyst in the lower left breast, likely a sebaceous/epidermoid cyst.  No mammographic evidence of breast malignancy bilaterally.  RECOMMENDATION: Bilateral screening mammograms at age 60.   Other Problems (April Staton, CMA; 12/18/2015 9:45 AM) High blood pressure Sleep Apnea  Past Surgical History (April Staton, [place]; 12/18/2015 9:45 AM) No pertinent past surgical history  Diagnostic Studies History (April Staton, CMA; 12/18/2015 9:45 AM) Colonoscopy never Mammogram within last year Pap Smear  1-5 years ago  Allergies (April Staton, CMA; 12/18/2015 9:48 AM) No Known Drug Allergies09/11/2015 (Marked as Inactive) Azithromycin *CHEMICALS* Nausea.  Medication History (April Staton, [place]; 12/18/2015 9:46 AM) No Current Medications  Social History (April Staton, [place]; 12/18/2015 9:45 AM) Alcohol use Occasional alcohol use. Caffeine use Tea. No drug use Tobacco use Never smoker.  Family History (April Staton, [place]; 12/18/2015 9:45 AM) Cancer Father. Colon Polyps Father. Hypertension Father, Mother.  Pregnancy / Birth History (April Staton, [place]; 12/18/2015 9:45 AM) Age at menarche 62 years. Contraceptive History Intrauterine device, Oral contraceptives. Gravida 5 Maternal age 57-20 Para 3 Regular periods    Review of Systems (April Staton CMA; 12/18/2015 9:45 AM) General Not Present- Appetite Loss, Chills, Fatigue, Fever, Night Sweats, Weight Gain and Weight Loss. Skin Present- New Lesions. Not Present- Change in Wart/Mole, Dryness, Hives, Jaundice, Non-Healing Wounds, Rash and Ulcer. HEENT Not Present- Earache, Hearing Loss, Hoarseness, Nose Bleed, Oral Ulcers, Ringing in the Ears, Seasonal Allergies, Sinus Pain, Sore Throat, Visual Disturbances, Wears glasses/contact lenses and Yellow Eyes. Respiratory Present- Snoring. Not Present- Bloody sputum, Chronic Cough, Difficulty Breathing and Wheezing. Breast Present- Skin Changes. Not Present- Breast Mass, Breast Pain and Nipple Discharge. Cardiovascular Not Present- Chest Pain, Difficulty Breathing Lying Down, Leg Cramps, Palpitations, Rapid Heart Rate, Shortness of Breath and Swelling of Extremities. Gastrointestinal Not Present- Abdominal Pain, Bloating, Bloody Stool, Change in Bowel Habits, Chronic diarrhea, Constipation, Difficulty Swallowing, Excessive gas, Gets full quickly at meals, Hemorrhoids, Indigestion, Nausea, Rectal Pain and Vomiting. Female Genitourinary Not Present- Frequency, Nocturia, Painful Urination,  Pelvic Pain and Urgency. Musculoskeletal Not Present- Back Pain, Joint Pain, Joint Stiffness, Muscle Pain, Muscle Weakness and Swelling of Extremities. Neurological Not  Present- Decreased Memory, Fainting, Headaches, Numbness, Seizures, Tingling, Tremor, Trouble walking and Weakness. Psychiatric Not Present- Anxiety, Bipolar, Change in Sleep Pattern, Depression, Fearful and Frequent crying. Endocrine Not Present- Cold Intolerance, Excessive Hunger, Hair Changes, Heat Intolerance, Hot flashes and New Diabetes. Hematology Not Present- Blood Thinners, Easy Bruising, Excessive bleeding, Gland problems, HIV and Persistent Infections.  Vitals (April Staton CMA; 12/18/2015 9:47 AM) 12/18/2015 9:46 AM Weight: 173.38 lb Height: 63in Height was reported by patient. Body Surface Area: 1.82 m Body Mass Index: 30.71 kg/m  Temp.: 98.60F(Oral)  Pulse: 68 (Regular)  P.OX: 98% (Room air) BP: 138/92 (Sitting, Left Arm, Standard)       Physical Exam Stark Klein MD; 12/18/2015 10:55 AM) General Mental Status-Alert. General Appearance-Consistent with stated age. Hydration-Well hydrated. Voice-Normal.  Head and Neck Head-normocephalic, atraumatic with no lesions or palpable masses. Trachea-midline. Thyroid Gland Characteristics - normal size and consistency.  Eye Eyeball - Bilateral-Extraocular movements intact. Sclera/Conjunctiva - Bilateral-No scleral icterus.  Chest and Lung Exam Chest and lung exam reveals -quiet, even and easy respiratory effort with no use of accessory muscles. Inspection Chest Wall - Normal. Back - normal.  Breast Note: Breasts are without masses bilaterally. There is minimal tenderness. No axillary lymphadenopathy. At 5:30 on the left breast, there is a small area of swelling with 2 punctate areas that are open. There is a small amount of sebaceous material expressible from one of the holes. This is approximately 1  mm.   Cardiovascular Cardiovascular examination reveals -normal pedal pulses bilaterally.  Neurologic Neurologic evaluation reveals -alert and oriented x 3 with no impairment of recent or remote memory. Mental Status-Normal.  Musculoskeletal Global Assessment -Note: no gross deformities.  Normal Exam - Left-Upper Extremity Strength Normal and Lower Extremity Strength Normal. Normal Exam - Right-Upper Extremity Strength Normal and Lower Extremity Strength Normal.  Lymphatic Head & Neck  General Head & Neck Lymphatics: Bilateral - Description - Normal. Axillary  General Axillary Region: Bilateral - Description - Normal. Tenderness - Non Tender.    Assessment & Plan Stark Klein MD; 12/18/2015 10:57 AM) LEFT BREAST MASS (N63) Impression: This appears to be a sebaceous cyst. I do think it is reasonable to excise this once the infection is completely cleared. This is bothersome to the patient and is likely to recur since there still appears to be some retained sebaceous material. I discussed that we can do this in the office or with some sedation in the operating room. I think due to the vascularity of the breast and the contaminated nature of the cyst, she would be less likely to have complications in the operating room. She was advised of this as an outpatient operation and that she would need someone every 18 to take her home and be with her overnight. I discussed that she cannot do any strenuous activity for around a week. 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 I advised her to take the day of surgery in the next day off from work. I discussed she cannot drive if she is taking narcotics.  I reviewed the risks of surgery as primarily being bleeding, infection, pain, scarring, and wound breakdown. She understands and wishes to proceed. She will call with a desired timeframe. Current Plans This is almost certainly a sebaceous  cyst. If it is getting red and painful again, please call for antibiotics, apply neosporin or vaseline in order to allow for spontaneous drainage. Would plan to do this  when it is NOT actively infected.  Main risks are bleeding, infection, and wound breakdown.  Call when you are ready to set up surgery.    You are being scheduled for surgery - Our schedulers will call you.  You should hear from our office's scheduling department within 5 working days about the location, date, and time of surgery. We try to make accommodations for patient's preferences in scheduling surgery, but sometimes the OR schedule or the surgeon's schedule prevents [place] from making those accommodations.  If you have not heard from our office [phone]) in 5 working days, call the office and ask for your surgeon's nurse.  If you have other questions about your diagnosis, plan, or surgery, call the office and ask for your surgeon's nurse.  Pt Education - [facility] Breast Biopsy HCI: discussed with patient and provided information.   Signed by Stark Klein, MD (12/18/2015 10:58 AM)

## 2016-01-14 ENCOUNTER — Ambulatory Visit (HOSPITAL_COMMUNITY): Payer: Managed Care, Other (non HMO) | Admitting: Certified Registered Nurse Anesthetist

## 2016-01-14 ENCOUNTER — Ambulatory Visit (HOSPITAL_COMMUNITY)
Admission: RE | Admit: 2016-01-14 | Discharge: 2016-01-14 | Disposition: A | Discharge status: Home / Self Care | Payer: Managed Care, Other (non HMO) | Source: Ambulatory Visit | Attending: General Surgery | Admitting: General Surgery

## 2016-01-14 ENCOUNTER — Encounter: Payer: Self-pay | Admitting: Internal Medicine

## 2016-01-14 ENCOUNTER — Encounter (HOSPITAL_COMMUNITY): Payer: Self-pay | Admitting: *Deleted

## 2016-01-14 ENCOUNTER — Encounter (HOSPITAL_COMMUNITY)
Admission: RE | Disposition: A | Discharge status: Home / Self Care | Payer: Self-pay | Source: Ambulatory Visit | Attending: General Surgery

## 2016-01-14 DIAGNOSIS — G4730 Sleep apnea, unspecified: Secondary | ICD-10-CM | POA: Insufficient documentation

## 2016-01-14 DIAGNOSIS — N6002 Solitary cyst of left breast: Secondary | ICD-10-CM | POA: Insufficient documentation

## 2016-01-14 DIAGNOSIS — I10 Essential (primary) hypertension: Secondary | ICD-10-CM | POA: Insufficient documentation

## 2016-01-14 HISTORY — PX: MASS EXCISION: SHX2000

## 2016-01-14 SURGERY — EXCISION MASS
Anesthesia: General | Site: Breast | Laterality: Left

## 2016-01-14 MED ORDER — FENTANYL CITRATE (PF) 100 MCG/2ML IJ SOLN
INTRAMUSCULAR | Status: DC | PRN
  Administered 2016-01-14: 100 ug via INTRAVENOUS

## 2016-01-14 MED ORDER — ONDANSETRON HCL 4 MG/2ML IJ SOLN
INTRAMUSCULAR | Status: AC
  Filled 2016-01-14: qty 2

## 2016-01-14 MED ORDER — HYDROMORPHONE HCL 1 MG/ML IJ SOLN: 0.2500 mg | INTRAMUSCULAR | Status: DC | PRN

## 2016-01-14 MED ORDER — PROPOFOL 10 MG/ML IV BOLUS
INTRAVENOUS | Status: DC | PRN
  Administered 2016-01-14: 120 mg via INTRAVENOUS

## 2016-01-14 MED ORDER — OXYCODONE HCL 5 MG PO TABS: 5.0000 mg | ORAL_TABLET | ORAL | Status: DC | PRN

## 2016-01-14 MED ORDER — FENTANYL CITRATE (PF) 100 MCG/2ML IJ SOLN
INTRAMUSCULAR | Status: AC
  Filled 2016-01-14: qty 4

## 2016-01-14 MED ORDER — CEFAZOLIN SODIUM-DEXTROSE 2-4 GM/100ML-% IV SOLN
2.0000 g | INTRAVENOUS | Status: AC
  Administered 2016-01-14: 2 g via INTRAVENOUS

## 2016-01-14 MED ORDER — MIDAZOLAM HCL 5 MG/5ML IJ SOLN
INTRAMUSCULAR | Status: DC | PRN
  Administered 2016-01-14: 2 mg via INTRAVENOUS

## 2016-01-14 MED ORDER — CEFAZOLIN SODIUM-DEXTROSE 2-4 GM/100ML-% IV SOLN
INTRAVENOUS | Status: AC
  Filled 2016-01-14: qty 100

## 2016-01-14 MED ORDER — EPHEDRINE 5 MG/ML INJ
INTRAVENOUS | Status: AC
  Filled 2016-01-14: qty 10

## 2016-01-14 MED ORDER — ONDANSETRON HCL 4 MG/2ML IJ SOLN
INTRAMUSCULAR | Status: DC | PRN
  Administered 2016-01-14: 4 mg via INTRAVENOUS

## 2016-01-14 MED ORDER — PROPOFOL 10 MG/ML IV BOLUS
INTRAVENOUS | Status: AC
  Filled 2016-01-14: qty 20

## 2016-01-14 MED ORDER — MIDAZOLAM HCL 2 MG/2ML IJ SOLN
INTRAMUSCULAR | Status: AC
  Filled 2016-01-14: qty 2

## 2016-01-14 MED ORDER — MEPERIDINE HCL 25 MG/ML IJ SOLN: 6.2500 mg | INTRAMUSCULAR | Status: DC | PRN

## 2016-01-14 MED ORDER — LIDOCAINE HCL (CARDIAC) 20 MG/ML IV SOLN
INTRAVENOUS | Status: DC | PRN
  Administered 2016-01-14: 100 mg via INTRAVENOUS

## 2016-01-14 MED ORDER — SUCCINYLCHOLINE CHLORIDE 200 MG/10ML IV SOSY
PREFILLED_SYRINGE | INTRAVENOUS | Status: AC
  Filled 2016-01-14: qty 10

## 2016-01-14 MED ORDER — OXYCODONE HCL 5 MG PO TABS: 5.0000 mg | ORAL_TABLET | Freq: Four times a day (QID) | ORAL | 0 refills | Status: DC | PRN

## 2016-01-14 MED ORDER — LIDOCAINE HCL (PF) 1 % IJ SOLN
INTRAMUSCULAR | Status: AC
  Filled 2016-01-14: qty 30

## 2016-01-14 MED ORDER — BUPIVACAINE-EPINEPHRINE (PF) 0.25% -1:200000 IJ SOLN
INTRAMUSCULAR | Status: DC | PRN
  Administered 2016-01-14: 20 mL

## 2016-01-14 MED ORDER — ONDANSETRON HCL 4 MG/2ML IJ SOLN
4.0000 mg | Freq: Once | INTRAMUSCULAR | Status: AC | PRN
Start: 2016-01-14 — End: 2016-01-14
  Administered 2016-01-14: 4 mg via INTRAVENOUS

## 2016-01-14 MED ORDER — MIDAZOLAM HCL 2 MG/2ML IJ SOLN
1.0000 mg | Freq: Once | INTRAMUSCULAR | Status: AC
  Administered 2016-01-14: 1 mg via INTRAVENOUS

## 2016-01-14 MED ORDER — 0.9 % SODIUM CHLORIDE (POUR BTL) OPTIME
TOPICAL | Status: DC | PRN
  Administered 2016-01-14: 1000 mL

## 2016-01-14 MED ORDER — LACTATED RINGERS IV SOLN
INTRAVENOUS | Status: DC | PRN
  Administered 2016-01-14: 07:00:00 via INTRAVENOUS

## 2016-01-14 MED ORDER — BUPIVACAINE-EPINEPHRINE (PF) 0.25% -1:200000 IJ SOLN
INTRAMUSCULAR | Status: AC
  Filled 2016-01-14: qty 30

## 2016-01-14 SURGICAL SUPPLY — 48 items
BENZOIN TINCTURE PRP APPL 2/3 (GAUZE/BANDAGES/DRESSINGS) IMPLANT
BINDER BREAST XLRG (GAUZE/BANDAGES/DRESSINGS) ×3 IMPLANT
BLADE SURG 10 STRL SS (BLADE) ×3 IMPLANT
CANISTER SUCTION 2500CC (MISCELLANEOUS) ×3 IMPLANT
CHLORAPREP W/TINT 26ML (MISCELLANEOUS) ×3 IMPLANT
CLOSURE WOUND 1/2 X4 (GAUZE/BANDAGES/DRESSINGS)
COVER SURGICAL LIGHT HANDLE (MISCELLANEOUS) ×3 IMPLANT
DRAPE LAPAROSCOPIC ABDOMINAL (DRAPES) IMPLANT
DRAPE LAPAROTOMY T 98X78 PEDS (DRAPES) IMPLANT
DRAPE UTILITY XL STRL (DRAPES) ×3 IMPLANT
DRSG TEGADERM 4X4.75 (GAUZE/BANDAGES/DRESSINGS) IMPLANT
ELECT CAUTERY BLADE 6.4 (BLADE) ×3 IMPLANT
ELECT REM PT RETURN 9FT ADLT (ELECTROSURGICAL) ×3
ELECTRODE REM PT RTRN 9FT ADLT (ELECTROSURGICAL) ×1 IMPLANT
GAUZE SPONGE 2X2 8PLY STRL LF (GAUZE/BANDAGES/DRESSINGS) ×1 IMPLANT
GAUZE SPONGE 4X4 12PLY STRL (GAUZE/BANDAGES/DRESSINGS) IMPLANT
GAUZE SPONGE 4X4 16PLY XRAY LF (GAUZE/BANDAGES/DRESSINGS) ×3 IMPLANT
GLOVE BIO SURGEON STRL SZ 6 (GLOVE) ×3 IMPLANT
GLOVE BIOGEL PI IND STRL 6.5 (GLOVE) ×1 IMPLANT
GLOVE BIOGEL PI INDICATOR 6.5 (GLOVE) ×2
GOWN STRL REUS W/ TWL LRG LVL3 (GOWN DISPOSABLE) ×3 IMPLANT
GOWN STRL REUS W/TWL 2XL LVL3 (GOWN DISPOSABLE) ×3 IMPLANT
GOWN STRL REUS W/TWL LRG LVL3 (GOWN DISPOSABLE) ×6
KIT BASIN OR (CUSTOM PROCEDURE TRAY) ×3 IMPLANT
KIT ROOM TURNOVER OR (KITS) ×3 IMPLANT
LIQUID BAND (GAUZE/BANDAGES/DRESSINGS) IMPLANT
NEEDLE HYPO 25GX1X1/2 BEV (NEEDLE) ×3 IMPLANT
NS IRRIG 1000ML POUR BTL (IV SOLUTION) ×3 IMPLANT
PACK SURGICAL SETUP 50X90 (CUSTOM PROCEDURE TRAY) ×3 IMPLANT
PAD ARMBOARD 7.5X6 YLW CONV (MISCELLANEOUS) ×6 IMPLANT
PENCIL BUTTON HOLSTER BLD 10FT (ELECTRODE) ×3 IMPLANT
SPECIMEN JAR SMALL (MISCELLANEOUS) ×3 IMPLANT
SPONGE GAUZE 2X2 STER 10/PKG (GAUZE/BANDAGES/DRESSINGS) ×2
SPONGE LAP 18X18 X RAY DECT (DISPOSABLE) ×3 IMPLANT
SPONGE LAP 4X18 X RAY DECT (DISPOSABLE) ×3 IMPLANT
STRIP CLOSURE SKIN 1/2X4 (GAUZE/BANDAGES/DRESSINGS) IMPLANT
SUT MON AB 4-0 PC3 18 (SUTURE) ×3 IMPLANT
SUT SILK 2 0 FS (SUTURE) IMPLANT
SUT VIC AB 3-0 SH 27 (SUTURE) ×2
SUT VIC AB 3-0 SH 27X BRD (SUTURE) ×1 IMPLANT
SYR BULB 3OZ (MISCELLANEOUS) ×3 IMPLANT
SYR CONTROL 10ML LL (SYRINGE) ×3 IMPLANT
TOWEL OR 17X24 6PK STRL BLUE (TOWEL DISPOSABLE) ×3 IMPLANT
TOWEL OR 17X26 10 PK STRL BLUE (TOWEL DISPOSABLE) ×3 IMPLANT
TUBE CONNECTING 12'X1/4 (SUCTIONS)
TUBE CONNECTING 12X1/4 (SUCTIONS) IMPLANT
WATER STERILE IRR 1000ML POUR (IV SOLUTION) IMPLANT
YANKAUER SUCT BULB TIP NO VENT (SUCTIONS) IMPLANT

## 2016-01-14 NOTE — Transfer of Care (Signed)
Immediate Anesthesia Transfer of Care Note  Patient: Emily Nolan  Procedure(s) Performed: Procedure(s): EXCISION OF LEFT BREAST MASS (Left)  Patient Location: PACU  Anesthesia Type:General  Level of Consciousness: awake, alert , oriented and patient cooperative  Airway & Oxygen Therapy: Patient Spontanous Breathing and Patient connected to nasal cannula oxygen  Post-op Assessment: Report given to RN, Post -op Vital signs reviewed and stable and Patient moving all extremities X 4  Post vital signs: Reviewed and stable  Last Vitals:  Vitals:   01/14/16 0621  BP: 123/87  Pulse: 75  Resp: 16  Temp: 36.9 C    Last Pain:  Vitals:   01/14/16 0621  TempSrc: Oral         Complications: Pt VSS but complains of heaviness in chest, Dr Conrad [place] notified and coming to PACU to assess pt

## 2016-01-14 NOTE — Anesthesia Preprocedure Evaluation (Addendum)
Anesthesia Evaluation  Patient identified by MRN, date of birth, ID band Patient awake    Reviewed: Allergy & Precautions, NPO status , Patient's Chart, lab work & pertinent test results  Airway Mallampati: II  TM Distance: >3 FB Neck ROM: Full    Dental  (+) Teeth Intact, Dental Advisory Given   Pulmonary sleep apnea ,    Pulmonary exam normal        Cardiovascular Normal cardiovascular exam     Neuro/Psych    GI/Hepatic   Endo/Other    Renal/GU      Musculoskeletal   Abdominal   Peds  Hematology   Anesthesia Other Findings   Reproductive/Obstetrics                            Anesthesia Physical Anesthesia Plan  ASA: II  Anesthesia Plan: General   Post-op Pain Management:    Induction: Intravenous  Airway Management Planned: LMA  Additional Equipment:   Intra-op Plan:   Post-operative Plan: Extubation in OR  Informed Consent: I have reviewed the patients History and Physical, chart, labs and discussed the procedure including the risks, benefits and alternatives for the proposed anesthesia with the patient or authorized representative who has indicated his/her understanding and acceptance.   Dental advisory given  Plan Discussed with: CRNA and Surgeon  Anesthesia Plan Comments:        Anesthesia Quick Evaluation

## 2016-01-14 NOTE — Progress Notes (Signed)
Relates having some heaviness l chest " feels like an elephant sitting there"..... Ccm = sinus tach/stable bp, minimal anxiousness......dr Conrad [place] made aware upon postop visit / orders rec'd

## 2016-01-14 NOTE — Interval H&P Note (Signed)
History and Physical Interval Note:  01/14/2016 7:38 AM  Emily Nolan  has presented today for surgery, with the diagnosis of LEFT BREAST MASS  The various methods of treatment have been discussed with the patient and family. After consideration of risks, benefits and other options for treatment, the patient has consented to  Procedure(s): EXCISION OF LEFT BREAST MASS (Left) as a surgical intervention .  The patient's history has been reviewed, patient examined, no change in status, stable for surgery.  I have reviewed the patient's chart and labs.  Questions were answered to the patient's satisfaction.     Acen Craun

## 2016-01-14 NOTE — Op Note (Signed)
Excisional Breast Biopsy  Indications: This patient presents with history of left breast mass, complex cyst.  Pre-operative Diagnosis: left breast mass  Post-operative Diagnosis: left breast mass  Surgeon: Stark Klein   Anesthesia: General LMA anesthesia and Local anesthesia 1% plain lidocaine, 0.25.% bupivacaine, with epinephrine  ASA Class: 2  Procedure Details  The patient was seen in the Holding Room. The risks, benefits, complications, treatment options, and expected outcomes were discussed with the patient. The possibilities of reaction to medication, pulmonary aspiration, bleeding, infection, the need for additional procedures, failure to diagnose a condition, and creating a complication requiring transfusion or operation were discussed with the patient. The patient concurred with the proposed plan, giving informed consent.  The site of surgery properly noted/marked. The patient was taken to Operating Room # 9, identified, and the procedure verified as Breast Excisional Biopsy. A Time Out was held and the above information confirmed.  After induction of anesthesia, the left  breast and chest were prepped and draped in standard fashion.  An elliptical incision was marked around the cystic mass and the skin lesion.  Dissection was carried down sharply and with cautery around the mass.  Hemostasis was achieved with cautery.  The wound was irrigated and closed with a 3-0 Vicryl interrupted deep dermal stitch and a 4-0 Monocryl subcuticular closure in layers.     Sterile dressings were applied. At the end of the operation, all sponge, instrument, and needle counts were correct.  Findings: grossly clear surgical margins  Estimated Blood Loss:  Minimal          Specimens: left breast mass         Complications:  None; patient tolerated the procedure well.         Disposition: PACU - hemodynamically stable.         Condition: stable

## 2016-01-14 NOTE — Anesthesia Postprocedure Evaluation (Signed)
Anesthesia Post Note  Patient: Emily Nolan  Procedure(s) Performed: Procedure(s) (LRB): EXCISION OF LEFT BREAST MASS (Left)  Patient location during evaluation: PACU Anesthesia Type: General Level of consciousness: awake and alert Pain management: pain level controlled Vital Signs Assessment: post-procedure vital signs reviewed and stable Respiratory status: spontaneous breathing, nonlabored ventilation, respiratory function stable and patient connected to nasal cannula oxygen Cardiovascular status: blood pressure returned to baseline and stable Postop Assessment: no signs of nausea or vomiting Anesthetic complications: no    Last Vitals:  Vitals:   01/14/16 1015 01/14/16 1031  BP: 131/83 (!) 157/76  Pulse: 73 72  Resp: 12 16  Temp: 36.7 C     Last Pain:  Vitals:   01/14/16 1031  TempSrc:   PainSc: 2                  Jamorris Ndiaye DAVID

## 2016-01-14 NOTE — Discharge Instructions (Addendum)
[facility] °Office Phone Number [phone] ° °BREAST BIOPSY/ PARTIAL MASTECTOMY: POST OP INSTRUCTIONS ° °Always review your discharge instruction sheet given to you by the facility where your surgery was performed. ° °IF YOU HAVE DISABILITY OR FAMILY LEAVE FORMS, YOU MUST BRING THEM TO THE OFFICE FOR PROCESSING.  DO NOT GIVE THEM TO YOUR DOCTOR. ° °1. A prescription for pain medication may be given to you upon discharge.  Take your pain medication as prescribed, if needed.  If narcotic pain medicine is not needed, then you may take acetaminophen (Tylenol) or ibuprofen (Advil) as needed. °2. Take your usually prescribed medications unless otherwise directed °3. If you need a refill on your pain medication, please contact your pharmacy.  They will contact our office to request authorization.  Prescriptions will not be filled after 5pm or on week-ends. °4. You should eat very light the first 24 hours after surgery, such as soup, crackers, pudding, etc.  Resume your normal diet the day after surgery. °5. Most patients will experience some swelling and bruising in the breast.  Ice packs and a good support bra will help.  Swelling and bruising can take several days to resolve.  °6. It is common to experience some constipation if taking pain medication after surgery.  Increasing fluid intake and taking a stool softener will usually help or prevent this problem from occurring.  A mild laxative (Milk of Magnesia or Miralax) should be taken according to package directions if there are no bowel movements after 48 hours. °7. Unless discharge instructions indicate otherwise, you may remove your bandages 48 hours after surgery, and you may shower at that time.  You may have steri-strips (small skin tapes) in place directly over the incision.  These strips should be left on the skin for 7-10 days.   Any sutures or staples will be removed at the office during your follow-up visit. °8. ACTIVITIES:  You may resume  regular daily activities (gradually increasing) beginning the next day.  Wearing a good support bra or sports bra (or the breast binder) minimizes pain and swelling.  You may have sexual intercourse when it is comfortable. °a. You may drive when you no longer are taking prescription pain medication, you can comfortably wear a seatbelt, and you can safely maneuver your car and apply brakes. °b. RETURN TO WORK:  __________1 week_______________ °9. You should see your doctor in the office for a follow-up appointment approximately two weeks after your surgery.  Your doctor’s nurse will typically make your follow-up appointment when she calls you with your pathology report.  Expect your pathology report 2-3 business days after your surgery.  You may call to check if you do not hear from us after three days. ° ° °WHEN TO CALL YOUR DOCTOR: °1. Fever over 101.0 °2. Nausea and/or vomiting. °3. Extreme swelling or bruising. °4. Continued bleeding from incision. °5. Increased pain, redness, or drainage from the incision. ° °The clinic staff is available to answer your questions during regular business hours.  Please don’t hesitate to call and ask to speak to one of the nurses for clinical concerns.  If you have a medical emergency, go to the nearest emergency room or call 911.  A surgeon from [facility] is always on call at the hospital. ° °For further questions, please visit [email or web address]  ° °

## 2016-01-14 NOTE — Anesthesia Procedure Notes (Signed)
Procedure Name: LMA Insertion Date/Time: 01/14/2016 7:54 AM Performed by: Carney Living Pre-anesthesia Checklist: Patient identified, Emergency Drugs available, Suction available, Patient being monitored and Timeout performed Patient Re-evaluated:Patient Re-evaluated prior to inductionOxygen Delivery Method: Circle system utilized Preoxygenation: Pre-oxygenation with 100% oxygen Intubation Type: IV induction LMA: LMA inserted LMA Size: 4.0 Number of attempts: 1 Placement Confirmation: positive ETCO2 and breath sounds checked- equal and bilateral Tube secured with: Tape Dental Injury: Teeth and Oropharynx as per pre-operative assessment

## 2016-01-15 ENCOUNTER — Other Ambulatory Visit: Payer: Self-pay | Admitting: Internal Medicine

## 2016-01-15 ENCOUNTER — Encounter (HOSPITAL_COMMUNITY): Payer: Self-pay | Admitting: General Surgery

## 2016-01-27 ENCOUNTER — Ambulatory Visit (INDEPENDENT_AMBULATORY_CARE_PROVIDER_SITE_OTHER): Payer: Managed Care, Other (non HMO) | Admitting: Internal Medicine

## 2016-01-27 ENCOUNTER — Encounter: Payer: Self-pay | Admitting: Internal Medicine

## 2016-01-27 VITALS — BP 108/70 | HR 76 | Temp 98.6°F | Resp 16 | Ht 63.25 in | Wt 178.0 lb

## 2016-01-27 DIAGNOSIS — Z1329 Encounter for screening for other suspected endocrine disorder: Secondary | ICD-10-CM

## 2016-01-27 DIAGNOSIS — Z131 Encounter for screening for diabetes mellitus: Secondary | ICD-10-CM

## 2016-01-27 DIAGNOSIS — Z0000 Encounter for general adult medical examination without abnormal findings: Secondary | ICD-10-CM

## 2016-01-27 DIAGNOSIS — Z13220 Encounter for screening for lipoid disorders: Secondary | ICD-10-CM

## 2016-01-27 DIAGNOSIS — Z130 Encounter for screening for diseases of the blood and blood-forming organs and certain disorders involving the immune mechanism: Secondary | ICD-10-CM

## 2016-01-27 DIAGNOSIS — E559 Vitamin D deficiency, unspecified: Secondary | ICD-10-CM

## 2016-01-27 DIAGNOSIS — R400 Somnolence: Secondary | ICD-10-CM

## 2016-01-27 DIAGNOSIS — Z1389 Encounter for screening for other disorder: Secondary | ICD-10-CM

## 2016-01-27 LAB — TSH: TSH: 1.14 mIU/L

## 2016-01-27 LAB — BASIC METABOLIC PANEL WITH GFR
BUN: 9 mg/dL (ref 7–25)
CO2: 26 mmol/L (ref 20–31)
CREATININE: 0.52 mg/dL (ref 0.50–1.10)
Calcium: 9.1 mg/dL (ref 8.6–10.2)
Chloride: 105 mmol/L (ref 98–110)
GFR, Est Non African American: >89 mL/min (ref >=60)
Glucose, Bld: 99 mg/dL (ref 65–99)
POTASSIUM: 3.7 mmol/L (ref 3.5–5.3)
Sodium: 140 mmol/L (ref 135–146)

## 2016-01-27 LAB — HEPATIC FUNCTION PANEL
ALBUMIN: 4.1 g/dL (ref 3.6–5.1)
ALT: 31 U/L — ABNORMAL HIGH (ref 6–29)
AST: 20 U/L (ref 10–30)
Alkaline Phosphatase: 54 U/L (ref 33–115)
BILIRUBIN TOTAL: 0.4 mg/dL (ref 0.2–1.2)
Bilirubin, Direct: 0.1 mg/dL (ref <=0.2)
Indirect Bilirubin: 0.3 mg/dL (ref 0.2–1.2)
Total Protein: 7.5 g/dL (ref 6.1–8.1)

## 2016-01-27 LAB — IRON AND TIBC
%SAT: 18 % (ref 11–50)
IRON: 67 ug/dL (ref 40–190)
TIBC: 381 ug/dL (ref 250–450)
UIBC: 314 ug/dL (ref 125–400)

## 2016-01-27 LAB — LIPID PANEL
CHOLESTEROL: 163 mg/dL (ref 125–200)
HDL: 47 mg/dL (ref >=46)
LDL Cholesterol: 83 mg/dL (ref <130)
TRIGLYCERIDES: 163 mg/dL — AB (ref <150)
Total CHOL/HDL Ratio: 3.5 Ratio (ref <=5.0)
VLDL: 33 mg/dL — ABNORMAL HIGH (ref <30)

## 2016-01-27 LAB — CBC WITH DIFFERENTIAL/PLATELET
BASOS PCT: 0 %
Basophils Absolute: 0 cells/uL (ref 0–200)
EOS ABS: 148 {cells}/uL (ref 15–500)
Eosinophils Relative: 2 %
HCT: 42.2 % (ref 35.0–45.0)
Hemoglobin: 14 g/dL (ref 11.7–15.5)
LYMPHS PCT: 34 %
Lymphs Abs: 2516 cells/uL (ref 850–3900)
MCH: 30.5 pg (ref 27.0–33.0)
MCHC: 33.2 g/dL (ref 32.0–36.0)
MCV: 91.9 fL (ref 80.0–100.0)
MONOS PCT: 8 %
MPV: 9.2 fL (ref 7.5–12.5)
Monocytes Absolute: 592 cells/uL (ref 200–950)
Neutro Abs: 4144 cells/uL (ref 1500–7800)
Neutrophils Relative %: 56 %
PLATELETS: 273 10*3/uL (ref 140–400)
RBC: 4.59 MIL/uL (ref 3.80–5.10)
RDW: 13.4 % (ref 11.0–15.0)
WBC: 7.4 10*3/uL (ref 3.8–10.8)

## 2016-01-27 LAB — MAGNESIUM: MAGNESIUM: 2.1 mg/dL (ref 1.5–2.5)

## 2016-01-27 MED ORDER — ARMODAFINIL 150 MG PO TABS: 150.0000 mg | ORAL_TABLET | Freq: Every day | ORAL | 1 refills | Status: DC

## 2016-01-27 NOTE — Progress Notes (Signed)
Annual Screening Comprehensive Examination   This very nice 40 y.o.female presents for complete physical.  Patient has no major health issues.  Patient reports no complaints at this time.  She recently had an excision of an epidermal cyst to her breast done by general surgery.  She tolerated the procedure well but is having a lot of sensitivity too.     Finally, patient has history of Vitamin D Deficiency and last vitamin D was  Lab Results  Component Value Date   VD25OH 33 01/07/2015  .  Currently on supplementation  She does have excessive daytime sleepiness secondary to sleep apnea.  She reports that she has not been taking it a lot lately.  She reports at it does help her.  She reports no palpitations, chest pains, shortness of breath.    She does still see obgyn.  She was last there last year.  She is due for a visit at the end of the year.  Pap smears have been normal.  She did have a mammogram this year for her breast cyst.      Current Outpatient Prescriptions on File Prior to Visit  Medication Sig Dispense Refill  . Armodafinil 150 MG tablet Take 1 tablet (150 mg total) by mouth daily. (Patient taking differently: Take 150 mg by mouth daily as needed (for energy). ) 30 tablet 1  . ibuprofen (ADVIL,MOTRIN) 200 MG tablet Take 600 mg by mouth every 6 (six) hours as needed for headache or moderate pain.     No current facility-administered medications on file prior to visit.     Allergies  Allergen Reactions  . Azithromycin Nausea Only  . Chlorhexidine Gluconate Rash    Past Medical History:  Diagnosis Date  . Benign hematuria    with negative GU workup  . HSV-2 infection     Immunization History  Administered Date(s) Administered  . Influenza-Unspecified 01/12/2015  . PPD Test 11/11/2013  . Tdap 04/11/2010    Past Surgical History:  Procedure Laterality Date  . Colonscopy    . MASS EXCISION Left 01/14/2016   Procedure: EXCISION OF LEFT BREAST MASS;  Surgeon:  Stark Klein, MD;  Location: [facility];  Service: General;  Laterality: Left;  . Tubligation    . WISDOM TOOTH EXTRACTION Left     Family History  Problem Relation Age of Onset  . Heart disease Mother   . Hypertension Father   . Heart disease Father   . Alcohol abuse Father   . Hyperlipidemia Father   . Stroke Paternal Uncle   . Diabetes Paternal Uncle   . Heart disease Maternal Grandmother     Social History   Social History  . Marital status: Married    Spouse name: N/A  . Number of children: N/A  . Years of education: N/A   Occupational History  . Not on file.   Social History Main Topics  . Smoking status: Never Smoker  . Smokeless tobacco: Never Used  . Alcohol use Yes     Comment: Rare  . Drug use: No  . Sexual activity: Not on file   Other Topics Concern  . Not on file   Social History Narrative  . No narrative on file    Review of Systems  Eyes: Negative for blurred vision, double vision and discharge.  Respiratory: Negative for cough, shortness of breath and wheezing.   Cardiovascular: Negative for chest pain, palpitations and leg swelling.  Gastrointestinal: Negative for abdominal pain, blood in stool,  constipation, diarrhea, heartburn and melena.  Genitourinary: Negative.   Neurological: Positive for dizziness. Negative for sensory change and loss of consciousness.  Psychiatric/Behavioral: Negative for depression. The patient is not nervous/anxious and does not have insomnia.       Physical Exam  BP 108/70   Pulse 76   Temp 98.6 F (37 C) (Temporal)   Resp 16   Ht 5' 3.25" (1.607 m)   Wt 178 lb (80.7 kg)   LMP 01/13/2016   BMI 31.28 kg/m   General Appearance: Well nourished and in no apparent distress. Eyes: PERRLA, EOMs, conjunctiva no swelling or erythema, normal fundi and vessels. Sinuses: No frontal/maxillary tenderness ENT/Mouth: EACs patent / TMs  nl. Nares clear without erythema, swelling, mucoid exudates. Oral hygiene is good. No  erythema, swelling, or exudate. Tongue normal, non-obstructing. Tonsils not swollen or erythematous. Hearing normal.  Neck: Supple, thyroid normal. No bruits, nodes or JVD. Respiratory: Respiratory effort normal.  BS equal and clear bilateral without rales, rhonci, wheezing or stridor. Cardio: Heart sounds are normal with regular rate and rhythm and no murmurs, rubs or gallops. Peripheral pulses are normal and equal bilaterally without edema. No aortic or femoral bruits. Chest: symmetric with normal excursions and percussion. Breasts: Symmetric, without lumps, nipple discharge, retractions, or fibrocystic changes. Well healing incision to the left inferior breast with steri strips still in place.  Non-tender to palpation.  No redness or erythema Abdomen: Flat, soft, with bowl sounds. Nontender, no guarding, rebound, hernias, masses, or organomegaly.  Lymphatics: Non tender without lymphadenopathy.  Genitourinary: deferred to gyn Musculoskeletal: Full ROM all peripheral extremities, joint stability, 5/5 strength, and normal gait. Skin: Warm and dry without rashes, lesions, cyanosis, clubbing or  ecchymosis.  Neuro: Cranial nerves intact, reflexes equal bilaterally. Normal muscle tone, no cerebellar symptoms. Sensation intact.  Pysch: Awake and oriented X 3, normal affect, Insight and Judgment appropriate.   Assessment and Plan   1. Routine general medical examination at a health care facility  - CBC with Differential/Platelet - BASIC METABOLIC PANEL WITH GFR - Hepatic function panel - Magnesium  2. Screening for hyperlipidemia  - Lipid panel  3. Screening for diabetes mellitus  - Hemoglobin A1c - Insulin, random  4. Screening for deficiency anemia  - Iron and TIBC - Vitamin B12  5. Screening for hematuria or proteinuria  - Urinalysis, Routine w reflex microscopic (not at [facility]) - Microalbumin / creatinine urine ratio  6. Screening for thyroid disorder  - TSH  7. Vitamin D  deficiency  - VITAMIN D 25 Hydroxy (Vit-D Deficiency, Fractures)  8.  Daytime hypersomnolence secondary to OSA -prescription for provigil given  9.  Recent breast surgery -well healing -can consider Retin-A for scar treatment -discussed wound care and patient is aware   Continue prudent diet as discussed, weight control, regular exercise, and medications. Routine screening labs and tests as requested with regular follow-up as recommended.  Over 40 minutes of exam, counseling, chart review and critical decision making was performed

## 2016-01-28 LAB — URINALYSIS, MICROSCOPIC ONLY
Bacteria, UA: NONE SEEN [HPF]
CASTS: NONE SEEN [LPF]
CRYSTALS: NONE SEEN [HPF]
WBC UA: NONE SEEN WBC/HPF (ref <=5)
Yeast: NONE SEEN [HPF]

## 2016-01-28 LAB — URINALYSIS, ROUTINE W REFLEX MICROSCOPIC
BILIRUBIN URINE: NEGATIVE
GLUCOSE, UA: NEGATIVE
Ketones, ur: NEGATIVE
Leukocytes, UA: NEGATIVE
Nitrite: NEGATIVE
PROTEIN: NEGATIVE
Specific Gravity, Urine: 1.024 (ref 1.001–1.035)
pH: 5.5 (ref 5.0–8.0)

## 2016-01-28 LAB — HEMOGLOBIN A1C
Hgb A1c MFr Bld: 5.1 % (ref <5.7)
Mean Plasma Glucose: 100 mg/dL

## 2016-01-28 LAB — MICROALBUMIN / CREATININE URINE RATIO
Creatinine, Urine: 206 mg/dL (ref 20–320)
Microalb Creat Ratio: 10 mcg/mg creat (ref <30)
Microalb, Ur: 2.1 mg/dL

## 2016-01-28 LAB — VITAMIN D 25 HYDROXY (VIT D DEFICIENCY, FRACTURES): VIT D 25 HYDROXY: 19 ng/mL — AB (ref 30–100)

## 2016-01-28 LAB — INSULIN, RANDOM: Insulin: 33.6 u[IU]/mL — ABNORMAL HIGH (ref 2.0–19.6)

## 2016-01-28 LAB — VITAMIN B12: VITAMIN B 12: 427 pg/mL (ref 200–1100)

## 2016-03-07 ENCOUNTER — Encounter: Payer: Self-pay | Admitting: Internal Medicine

## 2016-03-07 ENCOUNTER — Ambulatory Visit (INDEPENDENT_AMBULATORY_CARE_PROVIDER_SITE_OTHER): Payer: Managed Care, Other (non HMO) | Admitting: Internal Medicine

## 2016-03-07 VITALS — BP 122/84 | HR 80 | Temp 98.2°F | Resp 16 | Ht 63.25 in

## 2016-03-07 DIAGNOSIS — N3000 Acute cystitis without hematuria: Secondary | ICD-10-CM | POA: Diagnosis not present

## 2016-03-07 MED ORDER — CIPROFLOXACIN HCL 500 MG PO TABS: 500.0000 mg | ORAL_TABLET | Freq: Two times a day (BID) | ORAL | 0 refills | Status: AC

## 2016-03-07 NOTE — Progress Notes (Signed)
   Subjective:    Patient ID: Emily Nolan, female    DOB: 1976/01/23, 40 y.o.   MRN: [id number]  HPI  Patient presents to the office for evaluation of possible UTI x 2 days.  She has been having dysuria, frequency and urgency.  She has tried taking azo and also has been drinking cranberry juice.  She has had no relief.  She reports that she has no flank pain, hematuria, or low back pain.  No fevers, chills, nausea, or vomiting.     Review of Systems  Constitutional: Negative for chills, fatigue and fever.  Gastrointestinal: Negative for nausea and vomiting.  Genitourinary: Positive for dysuria, frequency and urgency. Negative for difficulty urinating, flank pain, hematuria, pelvic pain, vaginal bleeding, vaginal discharge and vaginal pain.       Objective:   Physical Exam  Constitutional: She is oriented to person, place, and time. She appears well-developed and well-nourished. No distress.  HENT:  Head: Normocephalic.  Mouth/Throat: Oropharynx is clear and moist. No oropharyngeal exudate.  Eyes: Conjunctivae are normal. No scleral icterus.  Neck: Normal range of motion. Neck supple. No JVD present. No thyromegaly present.  Cardiovascular: Normal rate, regular rhythm, normal heart sounds and intact distal pulses.  Exam reveals no gallop and no friction rub.   No murmur heard. Pulmonary/Chest: Effort normal and breath sounds normal. No respiratory distress. She has no wheezes. She has no rales. She exhibits no tenderness.  Abdominal: Soft. Normal appearance and bowel sounds are normal. She exhibits no distension and no mass. There is tenderness in the suprapubic area. There is no rebound, no guarding and no CVA tenderness.  Musculoskeletal: Normal range of motion.  Lymphadenopathy:    She has no cervical adenopathy.  Neurological: She is alert and oriented to person, place, and time.  Skin: Skin is warm and dry. She is not diaphoretic.  Psychiatric: She has a normal mood and  affect. Her behavior is normal. Judgment and thought content normal.  Nursing note and vitals reviewed.   Vitals:   03/07/16 1556  BP: 122/84  Pulse: 80  Resp: 16  Temp: 98.2 F (36.8 C)          Assessment & Plan:    1. Acute cystitis without hematuria  - ciprofloxacin (CIPRO) 500 MG tablet; Take 1 tablet (500 mg total) by mouth 2 (two) times daily.  Dispense: 10 tablet; Refill: 0 - Urinalysis, Routine w reflex microscopic (not at [facility]) - Culture, Urine

## 2016-03-08 LAB — URINALYSIS, ROUTINE W REFLEX MICROSCOPIC
Bilirubin Urine: NEGATIVE
Glucose, UA: NEGATIVE
KETONES UR: NEGATIVE
LEUKOCYTES UA: NEGATIVE
NITRITE: NEGATIVE
PH: 7 (ref 5.0–8.0)
Protein, ur: NEGATIVE
SPECIFIC GRAVITY, URINE: 1.019 (ref 1.001–1.035)

## 2016-03-08 LAB — URINALYSIS, MICROSCOPIC ONLY
BACTERIA UA: NONE SEEN [HPF]
CRYSTALS: NONE SEEN [HPF]
Casts: NONE SEEN [LPF]
Yeast: NONE SEEN [HPF]

## 2016-03-08 LAB — URINE CULTURE

## 2016-03-11 ENCOUNTER — Other Ambulatory Visit: Payer: Self-pay | Admitting: *Deleted

## 2016-03-11 MED ORDER — FLUCONAZOLE 150 MG PO TABS: ORAL_TABLET | ORAL | 1 refills | Status: DC

## 2016-04-06 ENCOUNTER — Other Ambulatory Visit: Payer: Self-pay

## 2016-05-04 ENCOUNTER — Encounter: Payer: Self-pay | Admitting: Internal Medicine

## 2016-05-04 ENCOUNTER — Ambulatory Visit (INDEPENDENT_AMBULATORY_CARE_PROVIDER_SITE_OTHER): Payer: Managed Care, Other (non HMO) | Admitting: Internal Medicine

## 2016-05-04 VITALS — BP 114/72 | HR 74 | Temp 98.2°F | Resp 16 | Ht 63.25 in

## 2016-05-04 DIAGNOSIS — B373 Candidiasis of vulva and vagina: Secondary | ICD-10-CM

## 2016-05-04 DIAGNOSIS — B3731 Acute candidiasis of vulva and vagina: Secondary | ICD-10-CM

## 2016-05-04 MED ORDER — FLUCONAZOLE 150 MG PO TABS: ORAL_TABLET | ORAL | 1 refills | Status: DC

## 2016-05-04 MED ORDER — VALACYCLOVIR HCL 500 MG PO TABS: 500.0000 mg | ORAL_TABLET | Freq: Two times a day (BID) | ORAL | 1 refills | Status: DC

## 2016-05-04 NOTE — Progress Notes (Signed)
Assessment and Plan:   1. Vaginal candidiasis -diflucan -can use ketoconazole cream otc -can repeat as needed     HPI 41 y.o.female presents for suspected vaginal yeast infection.  She started having thick white vaginal discharge and the itching that is consistent with her typical yeast infection.  She reports that this has been going on since Sunday.  She did try taking valtrex for 2 days. She thought initially may be the start of an HSV 2 outbreak.  She reports that this now feels more like a yeast infection.   Past Medical History:  Diagnosis Date  . Benign hematuria    with negative GU workup  . HSV-2 infection      Allergies  Allergen Reactions  . Azithromycin Nausea Only  . Chlorhexidine Gluconate Rash      Current Outpatient Prescriptions on File Prior to Visit  Medication Sig Dispense Refill  . Armodafinil 150 MG tablet Take 1 tablet (150 mg total) by mouth daily. 30 tablet 1  . ibuprofen (ADVIL,MOTRIN) 200 MG tablet Take 600 mg by mouth every 6 (six) hours as needed for headache or moderate pain.     No current facility-administered medications on file prior to visit.     Review of Systems  Constitutional: Negative for chills, fever and malaise/fatigue.  HENT: Negative for congestion, ear discharge, hearing loss, sinus pain, sore throat and tinnitus.   Respiratory: Negative for cough, shortness of breath and wheezing.   Cardiovascular: Negative for chest pain, palpitations and leg swelling.  Gastrointestinal: Negative for abdominal pain, blood in stool, constipation, diarrhea, heartburn, melena and vomiting.  Genitourinary: Positive for dysuria. Negative for flank pain, frequency, hematuria and urgency.       Vaginal discharge  Skin: Positive for itching and rash.     Physical Exam: There were no vitals filed for this visit. BP 114/72   Pulse 74   Temp 98.2 F (36.8 C) (Temporal)   Resp 16   Ht 5' 3.25" (1.607 m)   LMP 04/11/2016  General Appearance:  Well developed well nourished, non-toxic appearing in no apparent distress. Eyes: PERRLA, EOMs, conjunctiva w/ no swelling or erythema or discharge Sinuses: No Frontal/maxillary tenderness ENT/Mouth: Ear canals clear without swelling or erythema.  TM's normal bilaterally with no retractions, bulging, or loss of landmarks.   Neck: Supple, thyroid normal, no notable JVD  Respiratory: Respiratory effort normal, Clear breath sounds anteriorly and posteriorly bilaterally without rales, rhonchi, wheezing or stridor. No retractions or accessory muscle usage. Cardio: RRR with no MRGs.   Abdomen: Soft, + BS.  Non tender, no guarding, rebound, hernias, masses.  Musculoskeletal: Full ROM, 5/5 strength, normal gait.  Skin: Warm, dry without rashes  Neuro: Awake and oriented X 3, Cranial nerves intact. Normal muscle tone, no cerebellar symptoms. Sensation intact.  Psych: normal affect, Insight and Judgment appropriate.     Starlyn Skeans, PA-C 4:17 PM [facility]

## 2017-01-03 ENCOUNTER — Ambulatory Visit (INDEPENDENT_AMBULATORY_CARE_PROVIDER_SITE_OTHER): Payer: 59 | Admitting: Adult Health

## 2017-01-03 ENCOUNTER — Encounter: Payer: Self-pay | Admitting: Adult Health

## 2017-01-03 VITALS — BP 124/78 | HR 87 | Temp 98.1°F | Resp 20 | Ht 63.25 in | Wt 174.0 lb

## 2017-01-03 DIAGNOSIS — G4710 Hypersomnia, unspecified: Secondary | ICD-10-CM

## 2017-01-03 DIAGNOSIS — F418 Other specified anxiety disorders: Secondary | ICD-10-CM | POA: Insufficient documentation

## 2017-01-03 DIAGNOSIS — F419 Anxiety disorder, unspecified: Secondary | ICD-10-CM | POA: Insufficient documentation

## 2017-01-03 DIAGNOSIS — N920 Excessive and frequent menstruation with regular cycle: Secondary | ICD-10-CM | POA: Insufficient documentation

## 2017-01-03 DIAGNOSIS — G4730 Sleep apnea, unspecified: Secondary | ICD-10-CM

## 2017-01-03 NOTE — Progress Notes (Addendum)
Assessment and Plan:  Carneshia was seen today for acute visit and anxiety.  Diagnoses and all orders for this visit:  Anxiety -     CBC with Differential/Platelet -     BASIC METABOLIC PANEL WITH GFR -     TSH  Sleep apnea with hypersomnolence       -      Patient not using CPAP; recommend restarting this, or can follow up for a different mask for improved compliance. May be contributing factor in anxiety.   Rule out organic thyroid disease/anemia; discussed pending results of labs we may initiate her on an SSRI, information for cognitive behavioral therapy provided.    Further disposition pending results of labs. Discussed med's effects and SE's.   Over 30 minutes of exam, counseling, chart review, and critical decision making was performed.   Future Appointments Date Time Provider [place]  02/28/2017 3:00 PM Vicie Mutters, PA-C [id number] None    ------------------------------------------------------------------------------------------------------------------   HPI 41 y.o.female presents for new onset anxiety over the past 2-3 months, she is somewhat tearful in office today. She reports that she has a demanding job, and 3 kids at home, but that this is baseline for her and she has thrived in this environment for years, and despite no significant changes in her life recently, she has been feeling increasingly overwhelmed and having difficulty focusing throughout the day, and has had to "just walk away" from work a few days. She reports feeling "out of control" which is atypical and unsettling for her. She describes having accompanying somatic symptoms such as "feeling like my blood pressure is up," tense muscles, mild "queasy" sensation. She denies any specific episodes of panic attacks.   She has a history of mild sleep apnea with hypoxia down to 81% (sleep study 2016- Dr. Baird Lyons), did not tolerate CPAP well, has not been using the machine. She was previously on  armodinafil for hypersomnolence, but quit after taking the medication as she did not care for how it made her feel, and it seemed to be causing some hypertension. BP in the office today was 124/78.   She has also had some intermittent pelvic pain after essure (fallopian coils) placement a few months ago and is follow up with GYN regarding this issue.   BP 124/78   Pulse 87   Temp 98.1 F (36.7 C)   Resp 20   Ht 5' 3.25" (1.607 m)   Wt 174 lb (78.9 kg)   LMP 12/26/2016   SpO2 95%   BMI 30.58 kg/m    Past Medical History:  Diagnosis Date  . Benign hematuria    with negative GU workup  . HSV-2 infection      Allergies  Allergen Reactions  . Azithromycin Nausea Only  . Chlorhexidine Gluconate Rash    Current Outpatient Prescriptions on File Prior to Visit  Medication Sig  . ibuprofen (ADVIL,MOTRIN) 200 MG tablet Take 600 mg by mouth every 6 (six) hours as needed for headache or moderate pain.  . valACYclovir (VALTREX) 500 MG tablet Take 1 tablet (500 mg total) by mouth 2 (two) times daily.   No current facility-administered medications on file prior to visit.     ROS: Review of Systems  Constitutional: Negative for chills, diaphoresis, fever, malaise/fatigue and weight loss.  HENT: Negative.   Eyes: Negative for blurred vision and photophobia.  Respiratory: Negative for cough and shortness of breath.   Cardiovascular: Positive for palpitations ("feel my heart speed up when I'm  feeling anxious."). Negative for chest pain.  Gastrointestinal: Negative for abdominal pain, blood in stool, constipation, diarrhea, heartburn, melena, nausea and vomiting.  Genitourinary: Negative.   Musculoskeletal: Negative for joint pain and myalgias.  Skin: Negative for rash.  Neurological: Negative for dizziness, tingling, focal weakness and headaches.  Psychiatric/Behavioral: Negative for depression, hallucinations, substance abuse and suicidal ideas. The patient is nervous/anxious and has  insomnia (Unrestful sleep).      Physical Exam:  BP 124/78   Pulse 87   Temp 98.1 F (36.7 C)   Resp 20   Ht 5' 3.25" (1.607 m)   Wt 174 lb (78.9 kg)   LMP 12/26/2016   SpO2 95%   BMI 30.58 kg/m   General Appearance: Well nourished, friendly, somewhat tearful, in no acute distress.  ENT/Mouth: No erythema, swelling, or exudate on post pharynx.  Tonsils not swollen or erythematous. Hearing normal.  Neck: Supple, thyroid palpable but not diffusely enlarged, no palpable nodules/masses noted.  Respiratory: Respiratory effort normal, BS equal bilaterally without rales, rhonchi, wheezing or stridor.  Cardio: RRR with no MRGs. Brisk peripheral pulses without edema.  Abdomen: Soft, + BS.  Non tender, no guarding, rebound, hernias, masses. Lymphatics: Non tender without lymphadenopathy.  Musculoskeletal: Full ROM, 5/5 strength, normal gait.  Skin: Warm, dry without rashes, lesions, ecchymosis.  Neuro: Cranial nerves intact. Normal muscle tone, no cerebellar symptoms. Sensation intact.  Psych: Normal appearance, good hygiene/grooming, somewhat tearful, talkative but with normal speech, thought process/Insight/Judgment appropriate.     Izora Ribas, NP 6:50 PM [facility]

## 2017-01-03 NOTE — Patient Instructions (Addendum)
Cognitive behavioral therapy  Look up SSRI (group of medications)  Citalopram tablets What is this medicine? CITALOPRAM (sye TAL oh pram) is a medicine for depression. This medicine may be used for other purposes; ask your health care provider or pharmacist if you have questions. COMMON BRAND NAME(S): Celexa What should I tell my health care provider before I take this medicine? They need to know if you have any of these conditions: -bleeding disorders -bipolar disorder or a family history of bipolar disorder -glaucoma -heart disease -history of irregular heartbeat -kidney disease -liver disease -low levels of magnesium or potassium in the blood -receiving electroconvulsive therapy -seizures -suicidal thoughts, plans, or attempt; a previous suicide attempt by you or a family member -take medicines that treat or prevent blood clots -thyroid disease -an unusual or allergic reaction to citalopram, escitalopram, other medicines, foods, dyes, or preservatives -pregnant or trying to become pregnant -breast-feeding How should I use this medicine? Take this medicine by mouth with a glass of water. Follow the directions on the prescription label. You can take it with or without food. Take your medicine at regular intervals. Do not take your medicine more often than directed. Do not stop taking this medicine suddenly except upon the advice of your doctor. Stopping this medicine too quickly may cause serious side effects or your condition may worsen. A special MedGuide will be given to you by the pharmacist with each prescription and refill. Be sure to read this information carefully each time. Talk to your pediatrician regarding the use of this medicine in children. Special care may be needed. Patients over 61 years old may have a stronger reaction and need a smaller dose. Overdosage: If you think you have taken too much of this medicine contact a poison control center or emergency room at  once. NOTE: This medicine is only for you. Do not share this medicine with others. What if I miss a dose? If you miss a dose, take it as soon as you can. If it is almost time for your next dose, take only that dose. Do not take double or extra doses. What may interact with this medicine? Do not take this medicine with any of the following medications: -certain medicines for fungal infections like fluconazole, itraconazole, ketoconazole, posaconazole, voriconazole -cisapride -dofetilide -dronedarone -escitalopram -linezolid -MAOIs like Carbex, Eldepryl, Marplan, Nardil, and Parnate -methylene blue (injected into a vein) -pimozide -thioridazine -ziprasidone This medicine may also interact with the following medications: -alcohol -amphetamines -aspirin and aspirin-like medicines -carbamazepine -certain medicines for depression, anxiety, or psychotic disturbances -certain medicines for infections like chloroquine, clarithromycin, erythromycin, furazolidone, isoniazid, pentamidine -certain medicines for migraine headaches like almotriptan, eletriptan, frovatriptan, naratriptan, rizatriptan, sumatriptan, zolmitriptan -certain medicines for sleep -certain medicines that treat or prevent blood clots like dalteparin, enoxaparin, warfarin -cimetidine -diuretics -fentanyl -lithium -methadone -metoprolol -NSAIDs, medicines for pain and inflammation, like ibuprofen or naproxen -omeprazole -other medicines that prolong the QT interval (cause an abnormal heart rhythm) -procarbazine -rasagiline -supplements like St. John's wort, kava kava, valerian -tramadol -tryptophan This list may not describe all possible interactions. Give your health care provider a list of all the medicines, herbs, non-prescription drugs, or dietary supplements you use. Also tell them if you smoke, drink alcohol, or use illegal drugs. Some items may interact with your medicine. What should I watch for while using  this medicine? Tell your doctor if your symptoms do not get better or if they get worse. Visit your doctor or health care professional for regular checks on your  progress. Because it may take several weeks to see the full effects of this medicine, it is important to continue your treatment as prescribed by your doctor. Patients and their families should watch out for new or worsening thoughts of suicide or depression. Also watch out for sudden changes in feelings such as feeling anxious, agitated, panicky, irritable, hostile, aggressive, impulsive, severely restless, overly excited and hyperactive, or not being able to sleep. If this happens, especially at the beginning of treatment or after a change in dose, call your health care professional. Dennis Bast may get drowsy or dizzy. Do not drive, use machinery, or do anything that needs mental alertness until you know how this medicine affects you. Do not stand or sit up quickly, especially if you are an older patient. This reduces the risk of dizzy or fainting spells. Alcohol may interfere with the effect of this medicine. Avoid alcoholic drinks. Your mouth may get dry. Chewing sugarless gum or sucking hard candy, and drinking plenty of water will help. Contact your doctor if the problem does not go away or is severe. What side effects may I notice from receiving this medicine? Side effects that you should report to your doctor or health care professional as soon as possible: -allergic reactions like skin rash, itching or hives, swelling of the face, lips, or tongue -anxious -black, tarry stools -breathing problems -changes in vision -chest pain -confusion -elevated mood, decreased need for sleep, racing thoughts, impulsive behavior -eye pain -fast, irregular heartbeat -feeling faint or lightheaded, falls -feeling agitated, angry, or irritable -hallucination, loss of contact with reality -loss of balance or coordination -loss of memory -painful or  prolonged erections -restlessness, pacing, inability to keep still -seizures -stiff muscles -suicidal thoughts or other mood changes -trouble sleeping -unusual bleeding or bruising -unusually weak or tired -vomiting Side effects that usually do not require medical attention (report to your doctor or health care professional if they continue or are bothersome): -change in appetite or weight -change in sex drive or performance -dizziness -headache -increased sweating -indigestion, nausea -tremors This list may not describe all possible side effects. Call your doctor for medical advice about side effects. You may report side effects to FDA at 1-800-FDA-1088. Where should I keep my medicine? Keep out of reach of children. Store at room temperature between 15 and 30 degrees C (59 and 86 degrees F). Throw away any unused medicine after the expiration date. NOTE: This sheet is a summary. It may not cover all possible information. If you have questions about this medicine, talk to your doctor, pharmacist, or health care provider.  2018 Elsevier/Gold Standard (2015-08-31 13:18:52)    Counseling services  Here are some numbers below you can try but I suggest calling your insurance and finding out who is in your network and THEN calling those people or looking them up on google.   I'm a big fan of Cognitive Behavioral Therapy, look this up on You tube or check with the therapist you see if they are certified.  This form of therapy helps to teach you skills to better handle with current situation that are causing anxiety or depression.   [facility], NP [place].  Suite [phone] Fax [phone]  [facility] Hours: Monday-Thursday 830-8pm  Friday 830AM-7PM Address: [place] [phone] [phone].  Address: [place], [place] [address] [place] for  Cognitive Behavior Therapy [phone] office [email or web address]  [address] [place], [place], [place] [address]  Dr. Arbutus Leas, Ph.D. [address], [place] [address] Phone: [phone], [place] [phone] [place] [address] [place] [address]   Toy Cookey, MA, clinical psychologist  Cognitive-Behavior Therapy; Mood Disorders; Anxiety Disorders; adult and child ADHD; Family Therapy; Stress Management; personal growth, and Marital Therapy.    Terrance Mass Ph.D., clinical psychologist Cognitive-Behavior Therapy; Mood Disorders; Anxiety Disorders; Stress     Management  [facility] [address] [place], [place] [address] [phone]   [facility] [place], psychotherapist [address] [place], [place] [address] [phone]  Karin Golden Ph.D., clinical psychologist [phone] office [place], [place] [address] Cognitive Behavior Therapy, Depression, Bipolar, Anxiety, Grief and Loss

## 2017-01-04 LAB — CBC WITH DIFFERENTIAL/PLATELET
BASOS PCT: 0.5 %
Basophils Absolute: 43 cells/uL (ref 0–200)
Eosinophils Absolute: 145 cells/uL (ref 15–500)
Eosinophils Relative: 1.7 %
HEMATOCRIT: 37 % (ref 35.0–45.0)
Hemoglobin: 12.8 g/dL (ref 11.7–15.5)
LYMPHS ABS: 2465 {cells}/uL (ref 850–3900)
MCH: 30 pg (ref 27.0–33.0)
MCHC: 34.6 g/dL (ref 32.0–36.0)
MCV: 86.7 fL (ref 80.0–100.0)
MPV: 10.4 fL (ref 7.5–12.5)
Monocytes Relative: 5.7 %
Neutro Abs: 5364 cells/uL (ref 1500–7800)
Neutrophils Relative %: 63.1 %
PLATELETS: 290 10*3/uL (ref 140–400)
RBC: 4.27 10*6/uL (ref 3.80–5.10)
RDW: 13 % (ref 11.0–15.0)
Total Lymphocyte: 29 %
WBC: 8.5 10*3/uL (ref 3.8–10.8)
WBCMIX: 485 {cells}/uL (ref 200–950)

## 2017-01-04 LAB — BASIC METABOLIC PANEL WITH GFR
BUN: 9 mg/dL (ref 7–25)
CO2: 28 mmol/L (ref 20–32)
CREATININE: 0.53 mg/dL (ref 0.50–1.10)
Calcium: 9.1 mg/dL (ref 8.6–10.2)
Chloride: 103 mmol/L (ref 98–110)
GFR, Est African American: 140 mL/min/{1.73_m2} (ref >=60)
GFR, Est Non African American: 120 mL/min/{1.73_m2} (ref >=60)
GLUCOSE: 109 mg/dL — AB (ref 65–99)
Potassium: 3.6 mmol/L (ref 3.5–5.3)
Sodium: 138 mmol/L (ref 135–146)

## 2017-01-04 LAB — TSH: TSH: 1.08 mIU/L

## 2017-01-06 MED ORDER — SERTRALINE HCL 50 MG PO TABS: ORAL_TABLET | ORAL | 2 refills | Status: DC

## 2017-01-06 NOTE — Progress Notes (Signed)
No vm set up yet

## 2017-01-06 NOTE — Progress Notes (Signed)
Pt aware of lab results & voiced understanding of those results. Pt states she does not want to start medicines that are hard to come off of. Is Zoloft that kind of meds is the question?

## 2017-01-06 NOTE — Addendum Note (Signed)
Addended by: Izora Ribas on: 01/06/2017 08:22 AM   Modules accepted: Orders

## 2017-01-30 ENCOUNTER — Encounter: Payer: Self-pay | Admitting: Internal Medicine

## 2017-02-27 NOTE — Progress Notes (Signed)
Complete Physical  Assessment and Plan:  Sleep apnea with hypersomnolence Follow up with Dr. Toy Cookey  Anxiety -     busPIRone (BUSPAR) 10 MG tablet; Take 1 tablet (10 mg total) by mouth 3 (three) times daily as needed. -     ALPRAZolam (XANAX) 0.5 MG tablet; 1/2-1 tablet as needed up to 2 times daily for anxiety -     EKG 12-Lead Declines once a day at this time, will check labs, try as needed and follow up 3 months  Routine general medical examination at a health care facility 1 year  Screening for hyperlipidemia -     Lipid panel  Screening for deficiency anemia -     Vitamin B12 -     Iron,Total/Total Iron Binding Cap  Vitamin D deficiency GET ON VITAMIN D  Abnormal menses -     Follicle stimulating hormone  Medication management -     Hepatic function panel -     Magnesium  Screening for hematuria or proteinuria -     Urinalysis, Routine w reflex microscopic -     Microalbumin / creatinine urine ratio    Discussed med's effects and SE's. Screening labs and tests as requested with regular follow-up as recommended. Over 40 minutes of exam, counseling, chart review, and complex, high level critical decision making was performed this visit.   HPI  41 y.o. female  presents for a complete physical and follow up for has Sleep apnea with hypersomnolence; Anxiety; and Menorrhagia on their problem list..  She was seen 01/03/2017 for anxiety, she has demanding job/3 kids, has appointment to treat sleep apnea with Dr. Toy Cookey.   Her blood pressure has been controlled at home, today their BP is BP: 132/68 She does not workout. She denies chest pain, shortness of breath, dizziness.   She is not on cholesterol medication and denies myalgias. Her cholesterol is at goal. The cholesterol last visit was:   Lab Results  Component Value Date   CHOL 163 01/27/2016   HDL 47 01/27/2016   LDLCALC 83 01/27/2016   TRIG 163 (H) 01/27/2016   CHOLHDL 3.5 01/27/2016    Last A1C in the  office was:  Lab Results  Component Value Date   HGBA1C 5.1 01/27/2016   Patient is on Vitamin D supplement.   Lab Results  Component Value Date   VD25OH 19 (L) 01/27/2016     BMI is Body mass index is 30.41 kg/m., she is working on diet and exercise. Wt Readings from Last 3 Encounters:  02/28/17 174 lb 6.4 oz (79.1 kg)  01/03/17 174 lb (78.9 kg)  01/27/16 178 lb (80.7 kg)     Current Medications:  Current Outpatient Medications on File Prior to Visit  Medication Sig Dispense Refill  . ibuprofen (ADVIL,MOTRIN) 200 MG tablet Take 600 mg by mouth every 6 (six) hours as needed for headache or moderate pain.    . valACYclovir (VALTREX) 500 MG tablet Take 1 tablet (500 mg total) by mouth 2 (two) times daily. 60 tablet 1  . sertraline (ZOLOFT) 50 MG tablet Take 1/2 tab for the first week, then increase to a full tab daily. (Patient not taking: Reported on 02/28/2017) 30 tablet 2   No current facility-administered medications on file prior to visit.    Allergies:  Allergies  Allergen Reactions  . Azithromycin Nausea Only  . Chlorhexidine Gluconate Rash   Medical History:  She has Sleep apnea with hypersomnolence; Anxiety; and Menorrhagia on their problem list.  Health Maintenance:   Immunization History  Administered Date(s) Administered  . Influenza-Unspecified 01/12/2015  . PPD Test 11/11/2013  . Tdap 04/11/2010   HPV declines Tetanus: 2012 Pneumovax: Prevnar 13:  Flu vaccine: 2018 at work Zostavax:  Patient's last menstrual period was 02/02/2017 (exact date). Pap: 2018, [facility] MGM: 2017 DEXA: Colonoscopy:  EGD:  Patient Care Team: Unk Pinto, MD as PCP - General (Internal Medicine) Rozetta Nunnery, MD as Consulting Physician (Otolaryngology) Cheri Fowler, MD as Consulting Physician (Obstetrics and Gynecology) [place], [place] Eduardo Osier., MD as Attending Physician (Urology) Earlie Server, MD as Consulting Physician (Orthopedic  Surgery)  Surgical History:  She has a past surgical history that includes Colonscopy; Tubligation; Wisdom tooth extraction (Left); and Mass excision (Left, 01/14/2016). Family History:  Herfamily history includes Alcohol abuse in her father; Diabetes in her paternal uncle; Heart disease in her father, maternal grandmother, and mother; Hyperlipidemia in her father; Hypertension in her father; Stroke in her paternal uncle. Social History:  She reports that  has never smoked. she has never used smokeless tobacco. She reports that she drinks alcohol. She reports that she does not use drugs.  Review of Systems: Review of Systems  Constitutional: Negative for chills, diaphoresis, fever, malaise/fatigue and weight loss.  HENT: Negative.   Eyes: Negative for blurred vision and photophobia.  Respiratory: Negative for cough and shortness of breath.   Cardiovascular: Positive for palpitations ("feel my heart speed up when I'm feeling anxious."). Negative for chest pain.  Gastrointestinal: Negative for abdominal pain, blood in stool, constipation, diarrhea, heartburn, melena, nausea and vomiting.  Genitourinary: Negative.   Musculoskeletal: Negative for joint pain and myalgias.  Skin: Negative for rash.  Neurological: Negative for dizziness, tingling, focal weakness and headaches.  Psychiatric/Behavioral: Negative for depression, hallucinations, substance abuse and suicidal ideas. The patient is nervous/anxious and has insomnia (Unrestful sleep).     Physical Exam: Estimated body mass index is 30.41 kg/m as calculated from the following:   Height as of this encounter: 5' 3.5" (1.613 m).   Weight as of this encounter: 174 lb 6.4 oz (79.1 kg). BP 132/68   Pulse 87   Temp 97.7 F (36.5 C)   Ht 5' 3.5" (1.613 m)   Wt 174 lb 6.4 oz (79.1 kg)   LMP 02/02/2017 (Exact Date)   SpO2 99%   BMI 30.41 kg/m  General Appearance: Well nourished, in no apparent distress.  Eyes: PERRLA, EOMs, conjunctiva  no swelling or erythema, normal fundi and vessels.  Sinuses: No Frontal/maxillary tenderness  ENT/Mouth: Ext aud canals clear, normal light reflex with TMs without erythema, bulging. Good dentition. No erythema, swelling, or exudate on post pharynx. Tonsils not swollen or erythematous. Hearing normal.  Neck: Supple, thyroid normal. No bruits  Respiratory: Respiratory effort normal, BS equal bilaterally without rales, rhonchi, wheezing or stridor.  Cardio: RRR without murmurs, rubs or gallops. Brisk peripheral pulses without edema.  Chest: symmetric, with normal excursions and percussion.  Breasts: defer Abdomen: Soft, nontender, no guarding, rebound, hernias, masses, or organomegaly.  Lymphatics: Non tender without lymphadenopathy.  Genitourinary: defer Musculoskeletal: Full ROM all peripheral extremities,5/5 strength, and normal gait.  Skin: Warm, dry without rashes, lesions, ecchymosis. Neuro: Cranial nerves intact, reflexes equal bilaterally. Normal muscle tone, no cerebellar symptoms. Sensation intact.  Psych: Awake and oriented X 3, normal affect, Insight and Judgment appropriate.   EKG: WNL no ST changes. AORTA SCAN: defer  Vicie Mutters 3:49 PM [facility]

## 2017-02-28 ENCOUNTER — Encounter: Payer: Self-pay | Admitting: Physician Assistant

## 2017-02-28 ENCOUNTER — Ambulatory Visit (INDEPENDENT_AMBULATORY_CARE_PROVIDER_SITE_OTHER): Payer: 59 | Admitting: Physician Assistant

## 2017-02-28 VITALS — BP 132/68 | HR 87 | Temp 97.7°F | Ht 63.5 in | Wt 174.4 lb

## 2017-02-28 DIAGNOSIS — Z136 Encounter for screening for cardiovascular disorders: Secondary | ICD-10-CM | POA: Diagnosis not present

## 2017-02-28 DIAGNOSIS — Z130 Encounter for screening for diseases of the blood and blood-forming organs and certain disorders involving the immune mechanism: Secondary | ICD-10-CM

## 2017-02-28 DIAGNOSIS — I10 Essential (primary) hypertension: Secondary | ICD-10-CM

## 2017-02-28 DIAGNOSIS — E559 Vitamin D deficiency, unspecified: Secondary | ICD-10-CM

## 2017-02-28 DIAGNOSIS — N926 Irregular menstruation, unspecified: Secondary | ICD-10-CM

## 2017-02-28 DIAGNOSIS — Z1329 Encounter for screening for other suspected endocrine disorder: Secondary | ICD-10-CM

## 2017-02-28 DIAGNOSIS — G4710 Hypersomnia, unspecified: Secondary | ICD-10-CM

## 2017-02-28 DIAGNOSIS — R6889 Other general symptoms and signs: Secondary | ICD-10-CM

## 2017-02-28 DIAGNOSIS — Z0001 Encounter for general adult medical examination with abnormal findings: Secondary | ICD-10-CM

## 2017-02-28 DIAGNOSIS — Z0000 Encounter for general adult medical examination without abnormal findings: Secondary | ICD-10-CM

## 2017-02-28 DIAGNOSIS — G4730 Sleep apnea, unspecified: Secondary | ICD-10-CM

## 2017-02-28 DIAGNOSIS — Z79899 Other long term (current) drug therapy: Secondary | ICD-10-CM

## 2017-02-28 DIAGNOSIS — Z1389 Encounter for screening for other disorder: Secondary | ICD-10-CM

## 2017-02-28 DIAGNOSIS — Z131 Encounter for screening for diabetes mellitus: Secondary | ICD-10-CM

## 2017-02-28 DIAGNOSIS — Z13220 Encounter for screening for lipoid disorders: Secondary | ICD-10-CM

## 2017-02-28 DIAGNOSIS — F419 Anxiety disorder, unspecified: Secondary | ICD-10-CM

## 2017-02-28 MED ORDER — ALPRAZOLAM 0.5 MG PO TABS: ORAL_TABLET | ORAL | 0 refills | Status: DC

## 2017-02-28 MED ORDER — BUSPIRONE HCL 10 MG PO TABS: 10.0000 mg | ORAL_TABLET | Freq: Three times a day (TID) | ORAL | 0 refills | Status: DC | PRN

## 2017-02-28 NOTE — Patient Instructions (Addendum)
Vitamin D goal is between 60-80  Please make sure that you are taking your Vitamin D as directed.   It is very important as a natural anti-inflammatory   helping hair, skin, and nails, as well as reducing stroke and heart attack risk.   It helps your bones and helps with mood.  We want you on at least 5000 IU daily  It also decreases numerous cancer risks so please take it as directed.   Low Vit D is associated with a 200-300% higher risk for CANCER   and 200-300% higher risk for HEART   ATTACK  &  STROKE.    .....................................Marland Kitchen  It is also associated with higher death rate at younger ages,   autoimmune diseases like Rheumatoid arthritis, Lupus, Multiple Sclerosis.     Also many other serious conditions, like depression, Alzheimer's  Dementia, infertility, muscle aches, fatigue, fibromyalgia - just to name a few.  +++++++++++++++++++  Can get liquid vitamin D from [facility] here in [place] at  [facility] [address] [place], [place] [address] Or you can try earth fare   Use a dropper or use a cap to put peroxide, olive oil,mineral oil or canola oil in the effected ear- 2-3 times a week. Let it soak for 20-30 min then you can take a shower or use a baby bulb with warm water to wash out the ear wax.  Do not use Qtips   Generalized Anxiety Disorder, Adult Generalized anxiety disorder (GAD) is a mental health disorder. People with this condition constantly worry about everyday events. Unlike normal anxiety, worry related to GAD is not triggered by a specific event. These worries also do not fade or get better with time. GAD interferes with life functions, including relationships, work, and school. GAD can vary from mild to severe. People with severe GAD can have intense waves of anxiety with physical symptoms (panic attacks). What are the causes? The exact cause of GAD is not known. What increases the risk? This condition is more likely to  develop in:  Women.  People who have a family history of anxiety disorders.  People who are very shy.  People who experience very stressful life events, such as the death of a loved one.  People who have a very stressful family environment.  What are the signs or symptoms? People with GAD often worry excessively about many things in their lives, such as their health and family. They may also be overly concerned about:  Doing well at work.  Being on time.  Natural disasters.  Friendships.  Physical symptoms of GAD include:  Fatigue.  Muscle tension or having muscle twitches.  Trembling or feeling shaky.  Being easily startled.  Feeling like your heart is pounding or racing.  Feeling out of breath or like you cannot take a deep breath.  Having trouble falling asleep or staying asleep.  Sweating.  Nausea, diarrhea, or irritable bowel syndrome (IBS).  Headaches.  Trouble concentrating or remembering facts.  Restlessness.  Irritability.  How is this diagnosed? Your health care provider can diagnose GAD based on your symptoms and medical history. You will also have a physical exam. The health care provider will ask specific questions about your symptoms, including how severe they are, when they started, and if they come and go. Your health care provider may ask you about your use of alcohol or drugs, including prescription medicines. Your health care provider may refer you to a mental health specialist for further evaluation.  Your health care provider will do a thorough examination and may perform additional tests to rule out other possible causes of your symptoms. To be diagnosed with GAD, a person must have anxiety that:  Is out of his or her control.  Affects several different aspects of his or her life, such as work and relationships.  Causes distress that makes him or her unable to take part in normal activities.  Includes at least three physical symptoms  of GAD, such as restlessness, fatigue, trouble concentrating, irritability, muscle tension, or sleep problems.  Before your health care provider can confirm a diagnosis of GAD, these symptoms must be present more days than they are not, and they must last for six months or longer. How is this treated? The following therapies are usually used to treat GAD:  Medicine. Antidepressant medicine is usually prescribed for long-term daily control. Antianxiety medicines may be added in severe cases, especially when panic attacks occur.  Talk therapy (psychotherapy). Certain types of talk therapy can be helpful in treating GAD by providing support, education, and guidance. Options include: ? Cognitive behavioral therapy (CBT). People learn coping skills and techniques to ease their anxiety. They learn to identify unrealistic or negative thoughts and behaviors and to replace them with positive ones. ? Acceptance and commitment therapy (ACT). This treatment teaches people how to be mindful as a way to cope with unwanted thoughts and feelings. ? Biofeedback. This process trains you to manage your body's response (physiological response) through breathing techniques and relaxation methods. You will work with a therapist while machines are used to monitor your physical symptoms.  Stress management techniques. These include yoga, meditation, and exercise.  A mental health specialist can help determine which treatment is best for you. Some people see improvement with one type of therapy. However, other people require a combination of therapies. Follow these instructions at home:  Take over-the-counter and prescription medicines only as told by your health care provider.  Try to maintain a normal routine.  Try to anticipate stressful situations and allow extra time to manage them.  Practice any stress management or self-calming techniques as taught by your health care provider.  Do not punish yourself for  setbacks or for not making progress.  Try to recognize your accomplishments, even if they are small.  Keep all follow-up visits as told by your health care provider. This is important. Contact a health care provider if:  Your symptoms do not get better.  Your symptoms get worse.  You have signs of depression, such as: ? A persistently sad, cranky, or irritable mood. ? Loss of enjoyment in activities that used to bring you joy. ? Change in weight or eating. ? Changes in sleeping habits. ? Avoiding friends or family members. ? Loss of energy for normal tasks. ? Feelings of guilt or worthlessness. Get help right away if:  You have serious thoughts about hurting yourself or others. If you ever feel like you may hurt yourself or others, or have thoughts about taking your own life, get help right away. You can go to your nearest emergency department or call:  Your local emergency services (911 in the [place]  A suicide crisis helpline, such as the [place] at (915)478-1260. This is open 24 hours a day.  Summary  Generalized anxiety disorder (GAD) is a mental health disorder that involves worry that is not triggered by a specific event.  People with GAD often worry excessively about many things in their lives,  such as their health and family.  GAD may cause physical symptoms such as restlessness, trouble concentrating, sleep problems, frequent sweating, nausea, diarrhea, headaches, and trembling or muscle twitching.  A mental health specialist can help determine which treatment is best for you. Some people see improvement with one type of therapy. However, other people require a combination of therapies. This information is not intended to replace advice given to you by your health care provider. Make sure you discuss any questions you have with your health care provider. Document Released: 07/23/2012 Document Revised: 02/16/2016 Document Reviewed:  02/16/2016 Elsevier Interactive Patient Education  [employer].

## 2017-03-01 LAB — LIPID PANEL
CHOLESTEROL: 153 mg/dL (ref <200)
HDL: 41 mg/dL — AB (ref >50)
LDL Cholesterol (Calc): 78 mg/dL (calc)
Non-HDL Cholesterol (Calc): 112 mg/dL (calc) (ref <130)
TRIGLYCERIDES: 243 mg/dL — AB (ref <150)
Total CHOL/HDL Ratio: 3.7 (calc) (ref <5.0)

## 2017-03-01 LAB — MICROALBUMIN / CREATININE URINE RATIO
Creatinine, Urine: 100 mg/dL (ref 20–275)
MICROALB/CREAT RATIO: 13 ug/mg{creat} (ref <30)
Microalb, Ur: 1.3 mg/dL

## 2017-03-01 LAB — FOLLICLE STIMULATING HORMONE: FSH: 2.3 m[IU]/mL

## 2017-03-01 LAB — HEPATIC FUNCTION PANEL
AG Ratio: 1.3 (calc) (ref 1.0–2.5)
ALKALINE PHOSPHATASE (APISO): 57 U/L (ref 33–115)
ALT: 15 U/L (ref 6–29)
AST: 14 U/L (ref 10–30)
Albumin: 4.1 g/dL (ref 3.6–5.1)
Bilirubin, Direct: 0.1 mg/dL (ref 0.0–0.2)
GLOBULIN: 3.2 g/dL (ref 1.9–3.7)
Indirect Bilirubin: 0.1 mg/dL (calc) — ABNORMAL LOW (ref 0.2–1.2)
Total Bilirubin: 0.2 mg/dL (ref 0.2–1.2)
Total Protein: 7.3 g/dL (ref 6.1–8.1)

## 2017-03-01 LAB — URINALYSIS, ROUTINE W REFLEX MICROSCOPIC
Bacteria, UA: NONE SEEN /HPF
Bilirubin Urine: NEGATIVE
GLUCOSE, UA: NEGATIVE
HYALINE CAST: NONE SEEN /LPF
Ketones, ur: NEGATIVE
LEUKOCYTES UA: NEGATIVE
Nitrite: NEGATIVE
PH: 6.5 (ref 5.0–8.0)
Protein, ur: NEGATIVE
Specific Gravity, Urine: 1.021 (ref 1.001–1.03)

## 2017-03-01 LAB — MAGNESIUM: Magnesium: 2 mg/dL (ref 1.5–2.5)

## 2017-03-01 LAB — IRON, TOTAL/TOTAL IRON BINDING CAP
%SAT: 12 % (ref 11–50)
Iron: 47 ug/dL (ref 40–190)
TIBC: 388 mcg/dL (calc) (ref 250–450)

## 2017-03-01 LAB — VITAMIN B12: VITAMIN B 12: 399 pg/mL (ref 200–1100)

## 2017-04-14 ENCOUNTER — Telehealth: Payer: Self-pay | Admitting: Physician Assistant

## 2017-04-14 MED ORDER — PREDNISONE 20 MG PO TABS: ORAL_TABLET | ORAL | 0 refills | Status: DC

## 2017-04-14 MED ORDER — AMOXICILLIN-POT CLAVULANATE 875-125 MG PO TABS: 1.0000 | ORAL_TABLET | Freq: Two times a day (BID) | ORAL | 0 refills | Status: DC

## 2017-04-14 MED ORDER — PROMETHAZINE-DM 6.25-15 MG/5ML PO SYRP: 5.0000 mL | ORAL_SOLUTION | Freq: Four times a day (QID) | ORAL | 1 refills | Status: DC | PRN

## 2017-04-14 MED ORDER — ALBUTEROL SULFATE HFA 108 (90 BASE) MCG/ACT IN AERS: 2.0000 | INHALATION_SPRAY | RESPIRATORY_TRACT | 0 refills | Status: DC | PRN

## 2017-04-14 NOTE — Telephone Encounter (Signed)
Pt has been made aware of rx that was sent to pharmacy & voiced understanding of those results.

## 2017-04-14 NOTE — Telephone Encounter (Signed)
42 y.o. female calls with 1.5 weeks of URI/sinus symptoms. She has had fever, chills, sore throat, sinus drainage/pressure, now with cough, SOB and CP with cough. She has been on dayquil and not feeling better.   has Sleep apnea with hypersomnolence; Anxiety; and Menorrhagia on their problem list.  Allergies  Allergen Reactions  . Azithromycin Nausea Only  . Chlorhexidine Gluconate Rash   Will send in prednisone, Augmentin, cough syrup, albuterol, get on allergy pill, flonase.  If not better follow up in the office, if worsening symptoms go to the ER

## 2017-05-17 ENCOUNTER — Ambulatory Visit (INDEPENDENT_AMBULATORY_CARE_PROVIDER_SITE_OTHER): Payer: 59 | Admitting: Adult Health

## 2017-05-17 ENCOUNTER — Encounter: Payer: Self-pay | Admitting: Adult Health

## 2017-05-17 VITALS — BP 140/100 | HR 98 | Temp 97.5°F | Ht 63.5 in | Wt 179.0 lb

## 2017-05-17 DIAGNOSIS — J0111 Acute recurrent frontal sinusitis: Secondary | ICD-10-CM

## 2017-05-17 MED ORDER — PROMETHAZINE-DM 6.25-15 MG/5ML PO SYRP: 5.0000 mL | ORAL_SOLUTION | Freq: Four times a day (QID) | ORAL | 1 refills | Status: DC | PRN

## 2017-05-17 MED ORDER — AMOXICILLIN-POT CLAVULANATE 875-125 MG PO TABS
1.0000 | ORAL_TABLET | Freq: Two times a day (BID) | ORAL | 0 refills | Status: DC
Start: 2017-05-17 — End: 2017-08-25

## 2017-05-17 MED ORDER — PREDNISONE 20 MG PO TABS: ORAL_TABLET | ORAL | 0 refills | Status: DC

## 2017-05-17 NOTE — Patient Instructions (Signed)

## 2017-05-17 NOTE — Progress Notes (Signed)
Assessment and Plan:  Emily Nolan was seen today for uri.  Diagnoses and all orders for this visit:  Acute recurrent frontal sinusitis superimposed on viral URI - Discussed the importance of avoiding unnecessary antibiotic therapy. Suggested symptomatic OTC remedies. Nasal saline spray for congestion. Nasal steroids, allergy pill, oral steroids Follow up as needed. -     predniSONE (DELTASONE) 20 MG tablet; 2 tablets daily for 3 days, 1 tablet daily for 4 days. -     promethazine-dextromethorphan (PROMETHAZINE-DM) 6.25-15 MG/5ML syrup; Take 5 mLs by mouth 4 (four) times daily as needed for cough.  Fill and take if progresses to facial pain/fever:  -     amoxicillin-clavulanate (AUGMENTIN) 875-125 MG tablet; Take 1 tablet by mouth 2 (two) times daily. 7 days   Further disposition pending results of labs. Discussed med's effects and SE's.   Over 15 minutes of exam, counseling, chart review, and critical decision making was performed.   Future Appointments  Date Time Provider [place]  06/08/2017 11:00 AM Vicie Mutters, PA-C [facility] None  03/19/2018  3:00 PM Vicie Mutters, PA-C [facility] None    ------------------------------------------------------------------------------------------------------------------   HPI BP (!) 140/100   Pulse 98   Temp (!) 97.5 F (36.4 C)   Ht 5' 3.5" (1.613 m)   Wt 179 lb (81.2 kg)   SpO2 98%   BMI 31.21 kg/m   41 y.o.female presents for cough (not significantly productive), congestion, mild headache (well controlled by ibuprofen) x 2 days. no fever/chills, myalgia/arthralgias, rash, dizziness, vision changes, weakness, N/V/diarrhea. She endorses some generalized chest achiness since last night. Denies dyspnea, chest pain, extremity or neck pain. She does endorse a sense of tooth sensitivity/achiness.   She has tried OTC cold and flu medication.   Her daughter was diagnosed with the flu 3 days ago; treated by tamiflu. She did have the  flu vaccine through her work.   Past Medical History:  Diagnosis Date  . Benign hematuria    with negative GU workup  . HSV-2 infection      Allergies  Allergen Reactions  . Azithromycin Nausea Only  . Chlorhexidine Gluconate Rash    Current Outpatient Medications on File Prior to Visit  Medication Sig  . ALPRAZolam (XANAX) 0.5 MG tablet 1/2-1 tablet as needed up to 2 times daily for anxiety  . ibuprofen (ADVIL,MOTRIN) 200 MG tablet Take 600 mg by mouth every 6 (six) hours as needed for headache or moderate pain.  Marland Kitchen albuterol (VENTOLIN HFA) 108 (90 Base) MCG/ACT inhaler Inhale 2 puffs into the lungs every 4 (four) hours as needed for wheezing or shortness of breath. (Patient not taking: Reported on 05/17/2017)  . amoxicillin-clavulanate (AUGMENTIN) 875-125 MG tablet Take 1 tablet by mouth 2 (two) times daily. 7 days (Patient not taking: Reported on 05/17/2017)  . busPIRone (BUSPAR) 10 MG tablet Take 1 tablet (10 mg total) by mouth 3 (three) times daily as needed. (Patient not taking: Reported on 05/17/2017)  . predniSONE (DELTASONE) 20 MG tablet 2 tablets daily for 3 days, 1 tablet daily for 4 days. (Patient not taking: Reported on 05/17/2017)  . promethazine-dextromethorphan (PROMETHAZINE-DM) 6.25-15 MG/5ML syrup Take 5 mLs by mouth 4 (four) times daily as needed for cough. (Patient not taking: Reported on 05/17/2017)  . valACYclovir (VALTREX) 500 MG tablet Take 1 tablet (500 mg total) by mouth 2 (two) times daily. (Patient not taking: Reported on 05/17/2017)   No current facility-administered medications on file prior to visit.     ROS: Review of Systems  Constitutional: Positive for malaise/fatigue. Negative for chills, fever and weight loss.  HENT: Positive for congestion, sinus pain and sore throat. Negative for hearing loss and tinnitus.   Eyes: Negative for blurred vision and double vision.  Respiratory: Negative for cough, shortness of breath and wheezing.   Cardiovascular: Negative for  chest pain, palpitations, orthopnea, claudication and leg swelling.  Gastrointestinal: Negative for abdominal pain, blood in stool, constipation, diarrhea, heartburn, melena, nausea and vomiting.  Genitourinary: Negative.   Musculoskeletal: Negative for joint pain and myalgias.  Skin: Negative for rash.  Neurological: Positive for headaches (Mild). Negative for dizziness, tingling, sensory change and weakness.  Endo/Heme/Allergies: Negative for polydipsia.  Psychiatric/Behavioral: Negative.   All other systems reviewed and are negative.   Physical Exam:  BP (!) 140/100   Pulse 98   Temp (!) 97.5 F (36.4 C)   Ht 5' 3.5" (1.613 m)   Wt 179 lb (81.2 kg)   SpO2 98%   BMI 31.21 kg/m   General Appearance: Well nourished, appears vaguely unwell, in no acute distress. Eyes: PERRLA, EOMs, conjunctiva no swelling or erythema Sinuses: Bilateral generalized Frontal/maxillary tenderness ENT/Mouth: Ext aud canals clear, TMs without erythema, bulging. No erythema, swelling, or exudate on post pharynx.  Tonsils not swollen or erythematous. Hearing normal.  Neck: Supple, thyroid normal.  Respiratory: Respiratory effort normal, BS equal bilaterally without rales, rhonchi, wheezing or stridor, though mildly coarse throughout.   Cardio: RRR with no MRGs. Brisk peripheral pulses without edema.  Abdomen: Soft, + BS.  Non tender. Lymphatics: Non tender without lymphadenopathy.  Musculoskeletal: Full ROM, 5/5 strength, normal gait.  Skin: Warm, dry without rashes, lesions, ecchymosis.  Neuro: Cranial nerves intact. Normal muscle tone, no cerebellar symptoms. Sensation intact.  Psych: Awake and oriented X 3, normal affect, Insight and Judgment appropriate.     Izora Ribas, NP 3:10 PM [facility]

## 2017-06-06 DIAGNOSIS — R0989 Other specified symptoms and signs involving the circulatory and respiratory systems: Secondary | ICD-10-CM | POA: Insufficient documentation

## 2017-06-06 DIAGNOSIS — E669 Obesity, unspecified: Secondary | ICD-10-CM | POA: Insufficient documentation

## 2017-06-06 DIAGNOSIS — E785 Hyperlipidemia, unspecified: Secondary | ICD-10-CM | POA: Insufficient documentation

## 2017-06-06 NOTE — Progress Notes (Signed)
FOLLOW UP  Assessment and Plan:   Labile Hypertension Currently not on medications; lifestyle and monitoring only Monitor blood pressure at home; patient to call if consistently greater than 130/80 Continue DASH diet.   Reminder to go to the ER if any CP, SOB, nausea, dizziness, severe HA, changes vision/speech, left arm numbness and tingling and jaw pain.  Cholesterol New diagnosis; currently above goal; treated by lifestyle only Continue low cholesterol diet and exercise.  Check lipid panel.   Obesity with co morbidities Long discussion about weight loss, diet, and exercise Recommended diet heavy in fruits and veggies and low in animal meats, cheeses, and dairy products, appropriate calorie intake Discussed ideal weight for height Will follow up in 3 months  Anxiety Well managed by current regimen; continue medications xanax 0.25 -0.5 mg PRN Stress management techniques discussed, increase water, good sleep hygiene discussed, increase exercise, and increase veggies.   Continue diet and meds as discussed. Further disposition pending results of labs. Discussed med's effects and SE's.   Over 30 minutes of exam, counseling, chart review, and critical decision making was performed.   Future Appointments  Date Time Provider [place]  03/19/2018  3:00 PM Vicie Mutters, PA-C [facility] None    ----------------------------------------------------------------------------------------------------------------------  HPI 42 y.o. female  presents for 3 month follow up on hypertension, cholesterol,  weight and anxiety. She has sleep apnea with hypersomnolence, ?TMJ, is scheduled for fitting or oral divice in a few weeks.   she has a diagnosis of anxiety related to increased stress (daughter going through social anxiety and about to start seeing a counselor, several family members are ill) and is currently on buspar and xanx; has refused daily agent, reports symptoms are well  controlled on current regimen. She reports buspar didn't agree well with her she currently takes xanax 0.25 mg (1/2 tab) once daily as needed, currently taking 2-3 times a week. She reports she notes increased anxiety/stress is worse the week of her period and after. Discussed daily agents (SSRIs) today; would still like to postpone at this time.   BMI is Body mass index is 30.34 kg/m., she has not been working on diet and exercise. Wt Readings from Last 3 Encounters:  06/07/17 174 lb (78.9 kg)  05/17/17 179 lb (81.2 kg)  02/28/17 174 lb 6.4 oz (79.1 kg)   Her blood pressure has been controlled at home, today their BP is BP: 122/80  She does not workout. She denies chest pain, shortness of breath, dizziness.   She is not on cholesterol medication and denies myalgias. Her cholesterol is not at goal. The cholesterol last visit was:   Lab Results  Component Value Date   CHOL 153 02/28/2017   HDL 41 (L) 02/28/2017   LDLCALC 83 01/27/2016   TRIG 243 (H) 02/28/2017   CHOLHDL 3.7 02/28/2017     Current Medications:  Current Outpatient Medications on File Prior to Visit  Medication Sig  . ALPRAZolam (XANAX) 0.5 MG tablet 1/2-1 tablet as needed up to 2 times daily for anxiety  . amoxicillin-clavulanate (AUGMENTIN) 875-125 MG tablet Take 1 tablet by mouth 2 (two) times daily. 7 days  . ibuprofen (ADVIL,MOTRIN) 200 MG tablet Take 600 mg by mouth every 6 (six) hours as needed for headache or moderate pain.  . valACYclovir (VALTREX) 500 MG tablet Take 1 tablet (500 mg total) by mouth 2 (two) times daily. (Patient taking differently: Take 500 mg by mouth 2 (two) times daily. Prn)  . busPIRone (BUSPAR) 10 MG tablet  Take 1 tablet (10 mg total) by mouth 3 (three) times daily as needed. (Patient not taking: Reported on 06/07/2017)   No current facility-administered medications on file prior to visit.      Allergies:  Allergies  Allergen Reactions  . Azithromycin Nausea Only  . Chlorhexidine  Gluconate Rash     Medical History:  Past Medical History:  Diagnosis Date  . Benign hematuria    with negative GU workup  . HSV-2 infection    Family history- Reviewed and unchanged Social history- Reviewed and unchanged   Review of Systems:  Review of Systems  Constitutional: Negative for malaise/fatigue and weight loss.  HENT: Negative for hearing loss and tinnitus.   Eyes: Negative for blurred vision and double vision.  Respiratory: Negative for cough, shortness of breath and wheezing.   Cardiovascular: Negative for chest pain, palpitations, orthopnea, claudication and leg swelling.  Gastrointestinal: Negative for abdominal pain, blood in stool, constipation, diarrhea, heartburn, melena, nausea and vomiting.  Genitourinary: Negative.   Musculoskeletal: Negative for joint pain and myalgias.  Skin: Negative for rash.  Neurological: Negative for dizziness, tingling, sensory change, weakness and headaches.  Endo/Heme/Allergies: Negative for polydipsia.  Psychiatric/Behavioral: Negative for depression, substance abuse and suicidal ideas. The patient is nervous/anxious.   All other systems reviewed and are negative.    Physical Exam: BP 122/80   Pulse 80   Temp 97.7 F (36.5 C)   Ht 5' 3.5" (1.613 m)   Wt 174 lb (78.9 kg)   SpO2 98%   BMI 30.34 kg/m  Wt Readings from Last 3 Encounters:  06/07/17 174 lb (78.9 kg)  05/17/17 179 lb (81.2 kg)  02/28/17 174 lb 6.4 oz (79.1 kg)   General Appearance: Well nourished, in no apparent distress. Eyes: PERRLA, EOMs, conjunctiva no swelling or erythema Sinuses: No Frontal/maxillary tenderness ENT/Mouth: Ext aud canals clear, TMs without erythema, bulging. No erythema, swelling, or exudate on post pharynx.  Tonsils not swollen or erythematous. Hearing normal.  Neck: Supple, thyroid normal.  Respiratory: Respiratory effort normal, BS equal bilaterally without rales, rhonchi, wheezing or stridor.  Cardio: RRR with no MRGs. Brisk  peripheral pulses without edema.  Abdomen: Soft, + BS.  Non tender, no guarding, rebound, hernias, masses. Lymphatics: Non tender without lymphadenopathy.  Musculoskeletal: Full ROM, 5/5 strength, Normal gait Skin: Warm, dry without rashes, lesions, ecchymosis.  Neuro: Cranial nerves intact. No cerebellar symptoms.  Psych: Awake and oriented X 3, normal affect, Insight and Judgment appropriate.    Izora Ribas, NP 11:20 AM [facility]

## 2017-06-07 ENCOUNTER — Ambulatory Visit (INDEPENDENT_AMBULATORY_CARE_PROVIDER_SITE_OTHER): Payer: 59 | Admitting: Adult Health

## 2017-06-07 ENCOUNTER — Encounter: Payer: Self-pay | Admitting: Adult Health

## 2017-06-07 VITALS — BP 122/80 | HR 80 | Temp 97.7°F | Ht 63.5 in | Wt 174.0 lb

## 2017-06-07 DIAGNOSIS — G4730 Sleep apnea, unspecified: Secondary | ICD-10-CM

## 2017-06-07 DIAGNOSIS — Z79899 Other long term (current) drug therapy: Secondary | ICD-10-CM | POA: Diagnosis not present

## 2017-06-07 DIAGNOSIS — G4710 Hypersomnia, unspecified: Secondary | ICD-10-CM | POA: Diagnosis not present

## 2017-06-07 DIAGNOSIS — F419 Anxiety disorder, unspecified: Secondary | ICD-10-CM | POA: Diagnosis not present

## 2017-06-07 DIAGNOSIS — R0989 Other specified symptoms and signs involving the circulatory and respiratory systems: Secondary | ICD-10-CM | POA: Diagnosis not present

## 2017-06-07 DIAGNOSIS — E669 Obesity, unspecified: Secondary | ICD-10-CM

## 2017-06-07 DIAGNOSIS — E782 Mixed hyperlipidemia: Secondary | ICD-10-CM

## 2017-06-07 LAB — HEPATIC FUNCTION PANEL
AG Ratio: 1.2 (calc) (ref 1.0–2.5)
ALBUMIN MSPROF: 4.1 g/dL (ref 3.6–5.1)
ALT: 21 U/L (ref 6–29)
AST: 13 U/L (ref 10–30)
Alkaline phosphatase (APISO): 56 U/L (ref 33–115)
BILIRUBIN DIRECT: 0.1 mg/dL (ref 0.0–0.2)
BILIRUBIN TOTAL: 0.4 mg/dL (ref 0.2–1.2)
Globulin: 3.3 g/dL (calc) (ref 1.9–3.7)
Indirect Bilirubin: 0.3 mg/dL (calc) (ref 0.2–1.2)
Total Protein: 7.4 g/dL (ref 6.1–8.1)

## 2017-06-07 LAB — BASIC METABOLIC PANEL WITH GFR
BUN: 8 mg/dL (ref 7–25)
CALCIUM: 9.3 mg/dL (ref 8.6–10.2)
CO2: 25 mmol/L (ref 20–32)
Chloride: 106 mmol/L (ref 98–110)
Creat: 0.52 mg/dL (ref 0.50–1.10)
GFR, EST AFRICAN AMERICAN: 139 mL/min/{1.73_m2} (ref >=60)
GFR, EST NON AFRICAN AMERICAN: 120 mL/min/{1.73_m2} (ref >=60)
Glucose, Bld: 87 mg/dL (ref 65–99)
Potassium: 3.9 mmol/L (ref 3.5–5.3)
SODIUM: 138 mmol/L (ref 135–146)

## 2017-06-07 LAB — LIPID PANEL
Cholesterol: 144 mg/dL (ref <200)
HDL: 44 mg/dL — ABNORMAL LOW (ref >50)
LDL CHOLESTEROL (CALC): 73 mg/dL
Non-HDL Cholesterol (Calc): 100 mg/dL (calc) (ref <130)
Total CHOL/HDL Ratio: 3.3 (calc) (ref <5.0)
Triglycerides: 177 mg/dL — ABNORMAL HIGH (ref <150)

## 2017-06-07 LAB — CBC WITH DIFFERENTIAL/PLATELET
BASOS ABS: 34 {cells}/uL (ref 0–200)
Basophils Relative: 0.4 %
EOS ABS: 102 {cells}/uL (ref 15–500)
Eosinophils Relative: 1.2 %
HEMATOCRIT: 37.7 % (ref 35.0–45.0)
Hemoglobin: 12.7 g/dL (ref 11.7–15.5)
LYMPHS ABS: 2516 {cells}/uL (ref 850–3900)
MCH: 28.2 pg (ref 27.0–33.0)
MCHC: 33.7 g/dL (ref 32.0–36.0)
MCV: 83.8 fL (ref 80.0–100.0)
MPV: 10.7 fL (ref 7.5–12.5)
Monocytes Relative: 6.4 %
NEUTROS PCT: 62.4 %
Neutro Abs: 5304 cells/uL (ref 1500–7800)
Platelets: 270 10*3/uL (ref 140–400)
RBC: 4.5 10*6/uL (ref 3.80–5.10)
RDW: 14.6 % (ref 11.0–15.0)
Total Lymphocyte: 29.6 %
WBC: 8.5 10*3/uL (ref 3.8–10.8)
WBCMIX: 544 {cells}/uL (ref 200–950)

## 2017-06-07 LAB — TSH: TSH: 1.7 m[IU]/L

## 2017-06-07 NOTE — Patient Instructions (Addendum)
When you feel stressed or anxious during the day, try deep breaths in through your nose, and slow exhale through your mouth over 7 seconds. Imagine all the muscles in your body relaxing as you exhale. Repeat a few times.    Escitalopram tablets What is this medicine? ESCITALOPRAM (es sye TAL oh pram) is used to treat depression and certain types of anxiety. This medicine may be used for other purposes; ask your health care provider or pharmacist if you have questions. COMMON BRAND NAME(S): Lexapro What should I tell my health care provider before I take this medicine? They need to know if you have any of these conditions: -bipolar disorder or a family history of bipolar disorder -diabetes -glaucoma -heart disease -kidney or liver disease -receiving electroconvulsive therapy -seizures (convulsions) -suicidal thoughts, plans, or attempt by you or a family member -an unusual or allergic reaction to escitalopram, the related drug citalopram, other medicines, foods, dyes, or preservatives -pregnant or trying to become pregnant -breast-feeding How should I use this medicine? Take this medicine by mouth with a glass of water. Follow the directions on the prescription label. You can take it with or without food. If it upsets your stomach, take it with food. Take your medicine at regular intervals. Do not take it more often than directed. Do not stop taking this medicine suddenly except upon the advice of your doctor. Stopping this medicine too quickly may cause serious side effects or your condition may worsen. A special MedGuide will be given to you by the pharmacist with each prescription and refill. Be sure to read this information carefully each time. Talk to your pediatrician regarding the use of this medicine in children. Special care may be needed. Overdosage: If you think you have taken too much of this medicine contact a poison control center or emergency room at once. NOTE: This medicine  is only for you. Do not share this medicine with others. What if I miss a dose? If you miss a dose, take it as soon as you can. If it is almost time for your next dose, take only that dose. Do not take double or extra doses. What may interact with this medicine? Do not take this medicine with any of the following medications: -certain medicines for fungal infections like fluconazole, itraconazole, ketoconazole, posaconazole, voriconazole -cisapride -citalopram -dofetilide -dronedarone -linezolid -MAOIs like Carbex, Eldepryl, Marplan, Nardil, and Parnate -methylene blue (injected into a vein) -pimozide -thioridazine -ziprasidone This medicine may also interact with the following medications: -alcohol -amphetamines -aspirin and aspirin-like medicines -carbamazepine -certain medicines for depression, anxiety, or psychotic disturbances -certain medicines for migraine headache like almotriptan, eletriptan, frovatriptan, naratriptan, rizatriptan, sumatriptan, zolmitriptan -certain medicines for sleep -certain medicines that treat or prevent blood clots like warfarin, enoxaparin, dalteparin -cimetidine -diuretics -fentanyl -furazolidone -isoniazid -lithium -metoprolol -NSAIDs, medicines for pain and inflammation, like ibuprofen or naproxen -other medicines that prolong the QT interval (cause an abnormal heart rhythm) -procarbazine -rasagiline -supplements like St. John's wort, kava kava, valerian -tramadol -tryptophan This list may not describe all possible interactions. Give your health care provider a list of all the medicines, herbs, non-prescription drugs, or dietary supplements you use. Also tell them if you smoke, drink alcohol, or use illegal drugs. Some items may interact with your medicine. What should I watch for while using this medicine? Tell your doctor if your symptoms do not get better or if they get worse. Visit your doctor or health care professional for regular  checks on your progress. Because it may  take several weeks to see the full effects of this medicine, it is important to continue your treatment as prescribed by your doctor. Patients and their families should watch out for new or worsening thoughts of suicide or depression. Also watch out for sudden changes in feelings such as feeling anxious, agitated, panicky, irritable, hostile, aggressive, impulsive, severely restless, overly excited and hyperactive, or not being able to sleep. If this happens, especially at the beginning of treatment or after a change in dose, call your health care professional. Dennis Bast may get drowsy or dizzy. Do not drive, use machinery, or do anything that needs mental alertness until you know how this medicine affects you. Do not stand or sit up quickly, especially if you are an older patient. This reduces the risk of dizzy or fainting spells. Alcohol may interfere with the effect of this medicine. Avoid alcoholic drinks. Your mouth may get dry. Chewing sugarless gum or sucking hard candy, and drinking plenty of water may help. Contact your doctor if the problem does not go away or is severe. What side effects may I notice from receiving this medicine? Side effects that you should report to your doctor or health care professional as soon as possible: -allergic reactions like skin rash, itching or hives, swelling of the face, lips, or tongue -anxious -black, tarry stools -changes in vision -confusion -elevated mood, decreased need for sleep, racing thoughts, impulsive behavior -eye pain -fast, irregular heartbeat -feeling faint or lightheaded, falls -feeling agitated, angry, or irritable -hallucination, loss of contact with reality -loss of balance or coordination -loss of memory -painful or prolonged erections -restlessness, pacing, inability to keep still -seizures -stiff muscles -suicidal thoughts or other mood changes -trouble sleeping -unusual bleeding or  bruising -unusually weak or tired -vomiting Side effects that usually do not require medical attention (report to your doctor or health care professional if they continue or are bothersome): -changes in appetite -change in sex drive or performance -headache -increased sweating -indigestion, nausea -tremors This list may not describe all possible side effects. Call your doctor for medical advice about side effects. You may report side effects to FDA at 1-800-FDA-1088. Where should I keep my medicine? Keep out of reach of children. Store at room temperature between 15 and 30 degrees C (59 and 86 degrees F). Throw away any unused medicine after the expiration date. NOTE: This sheet is a summary. It may not cover all possible information. If you have questions about this medicine, talk to your doctor, pharmacist, or health care provider.  2018 Elsevier/Gold Standard (2015-08-31 13:20:23)   HOW TO TREAT VIRAL COUGH AND COLD SYMPTOMS:  -Symptoms usually last at least 1 week with the worst symptoms being around day 4.  - colds usually start with a sore throat and end with a cough, and the cough can take 2 weeks to get better.  -No antibiotics are needed for colds, flu, sore throats, cough, bronchitis UNLESS symptoms are longer than 7 days OR if you are getting better then get drastically worse.  -There are a lot of combination medications (Dayquil, Nyquil, Vicks 44, tyelnol cold and sinus, ETC). Please look at the ingredients on the back so that you are treating the correct symptoms and not doubling up on medications/ingredients.    Medicines you can use  Nasal congestion  Little Remedies saline spray (aerosol/mist)- can try this, it is in the kids section - pseudoephedrine (Sudafed)- behind the counter, do not use if you have high blood pressure, medicine that have -D  in them.  - phenylephrine (Sudafed PE) -Dextormethorphan + chlorpheniramine (Coridcidin HBP)- okay if you have high blood  pressure -Oxymetazoline (Afrin) nasal spray- LIMIT to 3 days -Saline nasal spray -Neti pot (used distilled or bottled water)  Ear pain/congestion  -pseudoephedrine (sudafed) - Nasonex/flonase nasal spray  Fever  -Acetaminophen (Tyelnol) -Ibuprofen (Advil, motrin, aleve)  Sore Throat  -Acetaminophen (Tyelnol) -Ibuprofen (Advil, motrin, aleve) -Drink a lot of water -Gargle with salt water - Rest your voice (don't talk) -Throat sprays -Cough drops  Body Aches  -Acetaminophen (Tyelnol) -Ibuprofen (Advil, motrin, aleve)  Headache  -Acetaminophen (Tyelnol) -Ibuprofen (Advil, motrin, aleve) - Exedrin, Exedrin Migraine  Allergy symptoms (cough, sneeze, runny nose, itchy eyes) -Claritin or loratadine cheapest but likely the weakest  -Zyrtec or certizine at night because it can make you sleepy -The strongest is allegra or fexafinadine  Cheapest at [employer], [employer], [employer]  Cough  -Dextromethorphan (Delsym)- medicine that has DM in it -Guafenesin (Mucinex/Robitussin) - cough drops - drink lots of water  Chest Congestion  -Guafenesin (Mucinex/Robitussin)  Red Itchy Eyes  - Naphcon-A  Upset Stomach  - Bland diet (nothing spicy, greasy, fried, and high acid foods like tomatoes, oranges, berries) -OKAY- cereal, bread, soup, crackers, rice -Eat smaller more frequent meals -reduce caffeine, no alcohol -Loperamide (Imodium-AD) if diarrhea -Prevacid for heart burn  General health when sick  -Hydration -wash your hands frequently -keep surfaces clean -change pillow cases and sheets often -Get fresh air but do not exercise strenuously -Vitamin D, double up on it - Vitamin C -Zinc

## 2017-06-08 ENCOUNTER — Ambulatory Visit: Payer: Self-pay | Admitting: Physician Assistant

## 2017-06-16 ENCOUNTER — Encounter: Payer: Self-pay | Admitting: Adult Health

## 2017-06-16 ENCOUNTER — Ambulatory Visit (INDEPENDENT_AMBULATORY_CARE_PROVIDER_SITE_OTHER): Payer: 59 | Admitting: Adult Health

## 2017-06-16 VITALS — BP 122/84 | HR 74 | Temp 98.1°F | Ht 63.5 in | Wt 173.0 lb

## 2017-06-16 DIAGNOSIS — R300 Dysuria: Secondary | ICD-10-CM | POA: Diagnosis not present

## 2017-06-16 DIAGNOSIS — F419 Anxiety disorder, unspecified: Secondary | ICD-10-CM | POA: Diagnosis not present

## 2017-06-16 MED ORDER — NITROFURANTOIN MONOHYD MACRO 100 MG PO CAPS: 100.0000 mg | ORAL_CAPSULE | Freq: Two times a day (BID) | ORAL | 0 refills | Status: AC

## 2017-06-16 MED ORDER — ALPRAZOLAM 0.5 MG PO TABS: ORAL_TABLET | ORAL | 0 refills | Status: DC

## 2017-06-16 NOTE — Patient Instructions (Addendum)
Push hydration, avoid excessive caffeine and other irritative substances as discussed below, start on antibiotic - I will let you know once the lab results if we need to do anything else. If you continue to have recurrent urinary issues let us know as well.    Urinary Frequency, Adult Urinary frequency means urinating more often than usual. People with urinary frequency urinate at least 8 times in 24 hours, even if they drink a normal amount of fluid. Although they urinate more often than normal, the total amount of urine produced in a day may be normal. Urinary frequency is also called pollakiuria. What are the causes? This condition may be caused by:  A urinary tract infection.  Obesity.  Bladder problems, such as bladder stones.  Caffeine or alcohol.  Eating food or drinking fluids that irritate the bladder. These include coffee, tea, soda, artificial sweeteners, citrus, tomato-based foods, and chocolate.  Certain medicines, such as medicines that help the body get rid of extra fluid (diuretics).  Muscle or nerve weakness.  Overactive bladder.  Chronic diabetes.  Interstitial cystitis.  In men, problems with the prostate, such as an enlarged prostate.  In women, pregnancy.  In some cases, the cause may not be known. What increases the risk? This condition is more likely to develop in:  Women who have gone through menopause.  Men with prostate problems.  People with a disease or injury that affects the nerves or spinal cord.  People who have or have had a condition that affects the brain, such as a stroke.  What are the signs or symptoms? Symptoms of this condition include:  Feeling an urgent need to urinate often. The stress and anxiety of needing to find a bathroom quickly can make this urge worse.  Urinating 8 or more times in 24 hours.  Urinating as often as every 1 to 2 hours.  How is this diagnosed? This condition is diagnosed based on your symptoms,  your medical history, and a physical exam. You may have tests, such as:  Blood tests.  Urine tests.  Imaging tests, such as X-rays or ultrasounds.  A bladder test.  A test of your neurological system. This is the body system that senses the need to urinate.  A test to check for problems in the urethra and bladder called cystoscopy.  You may also be asked to keep a bladder diary. A bladder diary is a record of what you eat and drink, how often you urinate, and how much you urinate. You may need to see a health care provider who specializes in conditions of the urinary tract (urologist) or kidneys (nephrologist). How is this treated? Treatment for this condition depends on the cause. Sometimes the condition goes away on its own and treatment is not necessary. If treatment is needed, it may include:  Taking medicine.  Learning exercises that strengthen the muscles that help control urination.  Following a bladder training program. This may include: ? Learning to delay going to the bathroom. ? Double urinating (voiding). This helps if you are not completely emptying your bladder. ? Scheduled voiding.  Making diet changes, such as: ? Avoiding caffeine. ? Drinking fewer fluids, especially alcohol. ? Not drinking in the evening. ? Not having foods or drinks that may irritate the bladder. ? Eating foods that help prevent or ease constipation. Constipation can make this condition worse.  Having the nerves in your bladder stimulated. There are two options for stimulating the nerves to your bladder: ? Outpatient electrical  nerve stimulation. This is done by your health care provider. ? Surgery to implant a bladder pacemaker. The pacemaker helps to control the urge to urinate.  Follow these instructions at home:  Keep a bladder diary if told to by your health care provider.  Take over-the-counter and prescription medicines only as told by your health care provider.  Do any exercises  as told by your health care provider.  Follow a bladder training program as told by your health care provider.  Make any recommended diet changes.  Keep all follow-up visits as told by your health care provider. This is important. Contact a health care provider if:  You start urinating more often.  You feel pain or irritation when you urinate.  You notice blood in your urine.  Your urine looks cloudy.  You develop a fever.  You begin vomiting. Get help right away if:  You are unable to urinate. This information is not intended to replace advice given to you by your health care provider. Make sure you discuss any questions you have with your health care provider. Document Released: 01/22/2009 Document Revised: 04/29/2015 Document Reviewed: 10/22/2014 Elsevier Interactive Patient Education  [employer].

## 2017-06-16 NOTE — Progress Notes (Signed)
Assessment and Plan:  Emily Nolan was seen today for urinary tract infection.  Diagnoses and all orders for this visit:  Dysuria Medications: nitrofurantoin. Maintain adequate hydration. Follow up if symptoms not improving, and as needed. -     Urinalysis w microscopic + reflex cultur -     nitrofurantoin, macrocrystal-monohydrate, (MACROBID) 100 MG capsule; Take 1 capsule (100 mg total) by mouth 2 (two) times daily for 5 days.  Further disposition pending results of labs. Discussed med's effects and SE's.   Over 15 minutes of exam, counseling, chart review, and critical decision making was performed.   Future Appointments  Date Time Provider [place]  12/14/2017  8:45 AM Vicie Mutters, PA-C [facility] None  03/19/2018  3:00 PM Vicie Mutters, PA-C [facility] None    ------------------------------------------------------------------------------------------------------------------   HPI BP 122/84   Pulse 74   Temp 98.1 F (36.7 C)   Ht 5' 3.5" (1.613 m)   Wt 173 lb (78.5 kg)   SpO2 99%   BMI 30.16 kg/m   41 y.o.female presents for suprapubic pressure, having to force urine stream, frequency, urgency with dysuria. She is also on her menstrual cycle and cannot tell if any changes in urine character.   Denies fever/chills, does have some lower back pain but typical of menstrual cycle. Has hx of asymptomatic microscopic hematuria which was followed up with a cysto and unremarkable.    Past Medical History:  Diagnosis Date  . Benign hematuria    with negative GU workup  . HSV-2 infection      Allergies  Allergen Reactions  . Azithromycin Nausea Only  . Chlorhexidine Gluconate Rash    Current Outpatient Medications on File Prior to Visit  Medication Sig  . ALPRAZolam (XANAX) 0.5 MG tablet 1/2-1 tablet as needed up to 2 times daily for anxiety  . ibuprofen (ADVIL,MOTRIN) 200 MG tablet Take 600 mg by mouth every 6 (six) hours as needed for headache or moderate pain.   . valACYclovir (VALTREX) 500 MG tablet Take 1 tablet (500 mg total) by mouth 2 (two) times daily. (Patient taking differently: Take 500 mg by mouth 2 (two) times daily. Prn)  . amoxicillin-clavulanate (AUGMENTIN) 875-125 MG tablet Take 1 tablet by mouth 2 (two) times daily. 7 days  . busPIRone (BUSPAR) 10 MG tablet Take 1 tablet (10 mg total) by mouth 3 (three) times daily as needed. (Patient not taking: Reported on 06/07/2017)   No current facility-administered medications on file prior to visit.     ROS: all negative except above.   Physical Exam:  BP 122/84   Pulse 74   Temp 98.1 F (36.7 C)   Ht 5' 3.5" (1.613 m)   Wt 173 lb (78.5 kg)   SpO2 99%   BMI 30.16 kg/m   General Appearance: Well nourished, in no apparent distress. Neck: Supple.  Respiratory: Respiratory effort normal, BS equal bilaterally without rales, rhonchi, wheezing or stridor.  Cardio: RRR with no MRGs. Brisk peripheral pulses without edema.  Abdomen: Soft, + BS.  Mild suprapubic tenderness, no guarding, rebound, hernias, masses. Lymphatics: Non tender without lymphadenopathy.  Musculoskeletal: strength, normal gait.  Skin: Warm, dry without rashes, lesions, ecchymosis.   Psych: Awake and oriented X 3, normal affect, Insight and Judgment appropriate.     Izora Ribas, NP 10:20 AM [facility]

## 2017-06-18 LAB — URINALYSIS W MICROSCOPIC + REFLEX CULTURE
BACTERIA UA: NONE SEEN /HPF
Bilirubin Urine: NEGATIVE
Glucose, UA: NEGATIVE
HYALINE CAST: NONE SEEN /LPF
Ketones, ur: NEGATIVE
Nitrites, Initial: NEGATIVE
SQUAMOUS EPITHELIAL / LPF: NONE SEEN /HPF (ref <=5)
Specific Gravity, Urine: 1.02 (ref 1.001–1.03)
pH: 6 (ref 5.0–8.0)

## 2017-06-18 LAB — CULTURE INDICATED

## 2017-06-18 LAB — URINE CULTURE
MICRO NUMBER: 90304799
SPECIMEN QUALITY:: ADEQUATE

## 2017-07-27 ENCOUNTER — Ambulatory Visit (INDEPENDENT_AMBULATORY_CARE_PROVIDER_SITE_OTHER): Payer: Self-pay | Admitting: Physician Assistant

## 2017-08-25 ENCOUNTER — Other Ambulatory Visit: Payer: Self-pay

## 2017-08-25 ENCOUNTER — Encounter (HOSPITAL_BASED_OUTPATIENT_CLINIC_OR_DEPARTMENT_OTHER): Payer: Self-pay | Admitting: *Deleted

## 2017-08-25 NOTE — Progress Notes (Signed)
SPOKE W/ PT VIA PHONE FOR PRE-OP INTERVIEW.  NPO AFTER MN.  ARRIVE AT 0530.  NEEDS URINE PREG.  GETTING CBC DONE Wednesday 08-30-2017 @ 0830.  PT IS ALLERGIC TO CHG, ASKED PT TO DO SOAP SHOWER AM DOS.

## 2017-08-30 ENCOUNTER — Encounter (HOSPITAL_COMMUNITY)
Admission: RE | Admit: 2017-08-30 | Discharge: 2017-08-30 | Disposition: A | Discharge status: Home / Self Care | Payer: 59 | Source: Ambulatory Visit | Attending: Obstetrics and Gynecology | Admitting: Obstetrics and Gynecology

## 2017-08-30 DIAGNOSIS — I10 Essential (primary) hypertension: Secondary | ICD-10-CM | POA: Diagnosis not present

## 2017-08-30 DIAGNOSIS — R102 Pelvic and perineal pain: Secondary | ICD-10-CM | POA: Diagnosis not present

## 2017-08-30 DIAGNOSIS — N838 Other noninflammatory disorders of ovary, fallopian tube and broad ligament: Secondary | ICD-10-CM | POA: Diagnosis not present

## 2017-08-30 DIAGNOSIS — G4733 Obstructive sleep apnea (adult) (pediatric): Secondary | ICD-10-CM | POA: Diagnosis not present

## 2017-08-30 DIAGNOSIS — N7011 Chronic salpingitis: Secondary | ICD-10-CM | POA: Diagnosis not present

## 2017-08-30 DIAGNOSIS — N920 Excessive and frequent menstruation with regular cycle: Secondary | ICD-10-CM | POA: Diagnosis not present

## 2017-08-30 LAB — CBC
HCT: 35.6 % — ABNORMAL LOW (ref 36.0–46.0)
HEMOGLOBIN: 11.6 g/dL — AB (ref 12.0–15.0)
MCH: 28 pg (ref 26.0–34.0)
MCHC: 32.6 g/dL (ref 30.0–36.0)
MCV: 86 fL (ref 78.0–100.0)
Platelets: 255 10*3/uL (ref 150–400)
RBC: 4.14 MIL/uL (ref 3.87–5.11)
RDW: 14.1 % (ref 11.5–15.5)
WBC: 7.2 10*3/uL (ref 4.0–10.5)

## 2017-08-31 NOTE — H&P (Signed)
Emily Nolan is an 42 y.o. female. She was seen for her annual gyn exam in April.  Having regular menses monthly, heavy for several days with clots, has pain for a few days before and around one week after, some cramping during. Pain is across pelvis, R>L. Sexually active, some discomfort after, has Essure.  Saline infusion ultrasound with normal endometrial cavity, benign pipelle.  All medical and surgical options discussed.  Pertinent Gynecological History: Last pap: normal Date: 12/2013 OB History: G5, P3023   SVD x 3 Menstrual History: Patient's last menstrual period was 08/07/2017 (approximate).    Past Medical History:  Diagnosis Date  . HSV-2 infection   . Mild obstructive sleep apnea    study 07-12-2014 in epic,  mild osa w/ hypopnea AHI 13/hr--- 08-25-2017 per pt uses dental device  . Pelvic pain in female   . PONV (postoperative nausea and vomiting)     Past Surgical History:  Procedure Laterality Date  . DILATION AND CURETTAGE OF UTERUS  age 11   w/ suction for miscarriage  . ESSURE TUBAL LIGATION Bilateral Jan or Feb 2012   dr Emily Nolan office  . MASS EXCISION Left 01/14/2016   Procedure: EXCISION OF LEFT BREAST MASS;  Surgeon: Emily Klein, MD;  Location: [facility] OR;  Service: General;  Laterality: Left;    Family History  Problem Relation Age of Onset  . Heart disease Mother   . Hypertension Father   . Heart disease Father   . Alcohol abuse Father   . Hyperlipidemia Father   . Stroke Paternal Uncle   . Diabetes Paternal Uncle   . Heart disease Maternal Grandmother     Social History:  reports that she has never smoked. She has never used smokeless tobacco. She reports that she drinks alcohol. She reports that she does not use drugs.  Allergies:  Allergies  Allergen Reactions  . Codeine Nausea And Vomiting  . Azithromycin Nausea And Vomiting  . Chlorhexidine Gluconate Rash    CHG    No medications prior to admission.    Review of Systems  Respiratory:  Negative.   Cardiovascular: Negative.     Height 5\' 3"  (1.6 m), weight 77.1 kg (170 lb), last menstrual period 08/07/2017. Physical Exam  Constitutional: She appears well-developed and well-nourished.  Neck: Neck supple. No thyromegaly present.  Cardiovascular: Normal rate, regular rhythm and normal heart sounds.  No murmur heard. Respiratory: Effort normal and breath sounds normal. No respiratory distress. She has no wheezes.  GI: Soft. She exhibits no distension and no mass. There is no tenderness.  Genitourinary: Vagina normal and uterus normal.  Genitourinary Comments: No adnexal mass    No results found for this or any previous visit (from the past 24 hour(s)).  No results found.  Assessment/Plan: Menorrhagia, pelvic pain with h/o Essure.  All medical and surgical options have been discussed.  Surgical procedure, risks, chances of relieving heavy bleeding and pain have all been discussed.  Will admit for laparoscopy with bilateral salpingectomy with removal of Essure coils, followed by hysteroscopy and endometrial ablation.  Emily Nolan 08/31/2017, 9:05 AM

## 2017-09-01 ENCOUNTER — Encounter (HOSPITAL_BASED_OUTPATIENT_CLINIC_OR_DEPARTMENT_OTHER)
Admission: RE | Disposition: A | Discharge status: Home / Self Care | Payer: Self-pay | Source: Ambulatory Visit | Attending: Obstetrics and Gynecology

## 2017-09-01 ENCOUNTER — Ambulatory Visit (HOSPITAL_BASED_OUTPATIENT_CLINIC_OR_DEPARTMENT_OTHER)
Admission: RE | Admit: 2017-09-01 | Discharge: 2017-09-01 | Disposition: A | Discharge status: Home / Self Care | Payer: 59 | Source: Ambulatory Visit | Attending: Obstetrics and Gynecology | Admitting: Obstetrics and Gynecology

## 2017-09-01 ENCOUNTER — Ambulatory Visit (HOSPITAL_BASED_OUTPATIENT_CLINIC_OR_DEPARTMENT_OTHER): Payer: 59 | Admitting: Certified Registered Nurse Anesthetist

## 2017-09-01 ENCOUNTER — Encounter (HOSPITAL_BASED_OUTPATIENT_CLINIC_OR_DEPARTMENT_OTHER): Payer: Self-pay | Admitting: *Deleted

## 2017-09-01 DIAGNOSIS — N920 Excessive and frequent menstruation with regular cycle: Secondary | ICD-10-CM | POA: Insufficient documentation

## 2017-09-01 DIAGNOSIS — R102 Pelvic and perineal pain: Secondary | ICD-10-CM | POA: Insufficient documentation

## 2017-09-01 DIAGNOSIS — I10 Essential (primary) hypertension: Secondary | ICD-10-CM | POA: Insufficient documentation

## 2017-09-01 DIAGNOSIS — G4733 Obstructive sleep apnea (adult) (pediatric): Secondary | ICD-10-CM | POA: Insufficient documentation

## 2017-09-01 DIAGNOSIS — N7011 Chronic salpingitis: Secondary | ICD-10-CM | POA: Insufficient documentation

## 2017-09-01 DIAGNOSIS — N838 Other noninflammatory disorders of ovary, fallopian tube and broad ligament: Secondary | ICD-10-CM | POA: Insufficient documentation

## 2017-09-01 HISTORY — DX: Nausea with vomiting, unspecified: R11.2

## 2017-09-01 HISTORY — DX: Other specified postprocedural states: Z98.890

## 2017-09-01 HISTORY — DX: Pelvic and perineal pain: R10.2

## 2017-09-01 HISTORY — DX: Obstructive sleep apnea (adult) (pediatric): G47.33

## 2017-09-01 HISTORY — PX: ENDOMETRIAL ABLATION: SHX621

## 2017-09-01 HISTORY — PX: LAPAROSCOPIC BILATERAL SALPINGECTOMY: SHX5889

## 2017-09-01 LAB — POCT PREGNANCY, URINE: Preg Test, Ur: NEGATIVE

## 2017-09-01 SURGERY — SALPINGECTOMY, BILATERAL, LAPAROSCOPIC
Anesthesia: General

## 2017-09-01 MED ORDER — ONDANSETRON HCL 4 MG/2ML IJ SOLN
INTRAMUSCULAR | Status: AC
  Filled 2017-09-01: qty 2

## 2017-09-01 MED ORDER — FENTANYL CITRATE (PF) 100 MCG/2ML IJ SOLN
INTRAMUSCULAR | Status: AC
  Filled 2017-09-01: qty 2

## 2017-09-01 MED ORDER — METOCLOPRAMIDE HCL 5 MG/ML IJ SOLN
5.0000 mg | Freq: Once | INTRAMUSCULAR | Status: AC
  Administered 2017-09-01: 5 mg via INTRAVENOUS
  Filled 2017-09-01: qty 1

## 2017-09-01 MED ORDER — SCOPOLAMINE 1 MG/3DAYS TD PT72
1.0000 | MEDICATED_PATCH | TRANSDERMAL | Status: DC
  Administered 2017-09-01: 1.5 mg via TRANSDERMAL
  Filled 2017-09-01: qty 1

## 2017-09-01 MED ORDER — ROCURONIUM BROMIDE 100 MG/10ML IV SOLN
INTRAVENOUS | Status: DC | PRN
  Administered 2017-09-01: 40 mg via INTRAVENOUS

## 2017-09-01 MED ORDER — FENTANYL CITRATE (PF) 250 MCG/5ML IJ SOLN
INTRAMUSCULAR | Status: DC | PRN
  Administered 2017-09-01: 100 ug via INTRAVENOUS
  Administered 2017-09-01: 50 ug via INTRAVENOUS
  Administered 2017-09-01: 100 ug via INTRAVENOUS

## 2017-09-01 MED ORDER — LIDOCAINE HCL 2 % IJ SOLN
INTRAMUSCULAR | Status: DC | PRN
  Administered 2017-09-01: 16 mL

## 2017-09-01 MED ORDER — SCOPOLAMINE 1 MG/3DAYS TD PT72
MEDICATED_PATCH | TRANSDERMAL | Status: AC
  Filled 2017-09-01: qty 1

## 2017-09-01 MED ORDER — LACTATED RINGERS IV SOLN
INTRAVENOUS | Status: DC
  Administered 2017-09-01 (×3): via INTRAVENOUS
  Filled 2017-09-01: qty 1000

## 2017-09-01 MED ORDER — FENTANYL CITRATE (PF) 250 MCG/5ML IJ SOLN
INTRAMUSCULAR | Status: AC
  Filled 2017-09-01: qty 5

## 2017-09-01 MED ORDER — MIDAZOLAM HCL 2 MG/2ML IJ SOLN
INTRAMUSCULAR | Status: AC
  Filled 2017-09-01: qty 2

## 2017-09-01 MED ORDER — HYDROCODONE-ACETAMINOPHEN 5-325 MG PO TABS
ORAL_TABLET | ORAL | Status: AC
  Filled 2017-09-01: qty 1

## 2017-09-01 MED ORDER — ONDANSETRON HCL 4 MG/2ML IJ SOLN
INTRAMUSCULAR | Status: DC | PRN
  Administered 2017-09-01: 4 mg via INTRAVENOUS

## 2017-09-01 MED ORDER — BUPIVACAINE HCL (PF) 0.25 % IJ SOLN
INTRAMUSCULAR | Status: DC | PRN
  Administered 2017-09-01: 14 mL

## 2017-09-01 MED ORDER — DEXAMETHASONE SODIUM PHOSPHATE 10 MG/ML IJ SOLN
INTRAMUSCULAR | Status: AC
  Filled 2017-09-01: qty 1

## 2017-09-01 MED ORDER — SUGAMMADEX SODIUM 200 MG/2ML IV SOLN
INTRAVENOUS | Status: AC
  Filled 2017-09-01: qty 2

## 2017-09-01 MED ORDER — HYDROCODONE-ACETAMINOPHEN 5-325 MG PO TABS: 1.0000 | ORAL_TABLET | ORAL | 0 refills | Status: DC | PRN

## 2017-09-01 MED ORDER — PROPOFOL 10 MG/ML IV BOLUS
INTRAVENOUS | Status: AC
  Filled 2017-09-01: qty 40

## 2017-09-01 MED ORDER — KETOROLAC TROMETHAMINE 30 MG/ML IJ SOLN
INTRAMUSCULAR | Status: AC
  Filled 2017-09-01: qty 1

## 2017-09-01 MED ORDER — LIDOCAINE HCL (CARDIAC) PF 100 MG/5ML IV SOSY
PREFILLED_SYRINGE | INTRAVENOUS | Status: DC | PRN
  Administered 2017-09-01: 80 mg via INTRAVENOUS

## 2017-09-01 MED ORDER — KETOROLAC TROMETHAMINE 30 MG/ML IJ SOLN
INTRAMUSCULAR | Status: DC | PRN
  Administered 2017-09-01: 30 mg via INTRAVENOUS

## 2017-09-01 MED ORDER — ROCURONIUM BROMIDE 10 MG/ML (PF) SYRINGE
PREFILLED_SYRINGE | INTRAVENOUS | Status: AC
  Filled 2017-09-01: qty 5

## 2017-09-01 MED ORDER — LIDOCAINE 2% (20 MG/ML) 5 ML SYRINGE
INTRAMUSCULAR | Status: AC
  Filled 2017-09-01: qty 5

## 2017-09-01 MED ORDER — MIDAZOLAM HCL 2 MG/2ML IJ SOLN
INTRAMUSCULAR | Status: DC | PRN
  Administered 2017-09-01: 2 mg via INTRAVENOUS

## 2017-09-01 MED ORDER — LACTATED RINGERS IV SOLN
INTRAVENOUS | Status: DC
  Filled 2017-09-01: qty 1000

## 2017-09-01 MED ORDER — METOCLOPRAMIDE HCL 5 MG/ML IJ SOLN
INTRAMUSCULAR | Status: AC
  Filled 2017-09-01: qty 2

## 2017-09-01 MED ORDER — FENTANYL CITRATE (PF) 100 MCG/2ML IJ SOLN
25.0000 ug | INTRAMUSCULAR | Status: DC | PRN
  Administered 2017-09-01 (×2): 50 ug via INTRAVENOUS
  Filled 2017-09-01: qty 1

## 2017-09-01 MED ORDER — PROPOFOL 10 MG/ML IV BOLUS
INTRAVENOUS | Status: DC | PRN
  Administered 2017-09-01: 170 mg via INTRAVENOUS

## 2017-09-01 MED ORDER — SUGAMMADEX SODIUM 200 MG/2ML IV SOLN
INTRAVENOUS | Status: DC | PRN
  Administered 2017-09-01: 160 mg via INTRAVENOUS

## 2017-09-01 MED ORDER — DEXAMETHASONE SODIUM PHOSPHATE 10 MG/ML IJ SOLN
INTRAMUSCULAR | Status: DC | PRN
  Administered 2017-09-01: 10 mg via INTRAVENOUS

## 2017-09-01 MED ORDER — HYDROCODONE-ACETAMINOPHEN 5-325 MG PO TABS
1.0000 | ORAL_TABLET | ORAL | Status: DC | PRN
  Administered 2017-09-01: 1 via ORAL
  Filled 2017-09-01: qty 1

## 2017-09-01 SURGICAL SUPPLY — 46 items
ABLATOR ENDOMETRIAL BIPOLAR (ABLATOR) IMPLANT
BAG RETRIEVAL 10MM (BASKET)
BIPOLAR CUTTING LOOP 21FR (ELECTRODE)
CANISTER SUCT 3000ML PPV (MISCELLANEOUS) ×4 IMPLANT
CATH ROBINSON RED A/P 16FR (CATHETERS) ×4 IMPLANT
DERMABOND ADVANCED (GAUZE/BANDAGES/DRESSINGS) ×6
DERMABOND ADVANCED .7 DNX12 (GAUZE/BANDAGES/DRESSINGS) ×6 IMPLANT
DILATOR CANAL MILEX (MISCELLANEOUS) IMPLANT
DRSG COVADERM PLUS 2X2 (GAUZE/BANDAGES/DRESSINGS) ×8 IMPLANT
DRSG OPSITE POSTOP 3X4 (GAUZE/BANDAGES/DRESSINGS) ×4 IMPLANT
DURAPREP 26ML APPLICATOR (WOUND CARE) ×4 IMPLANT
ELECT KNIFE MONO 22-24F 12/30D (ELECTROSURGICAL) ×4
ELECTRODE KNF MON 22-24F 12/30 (ELECTROSURGICAL) ×2 IMPLANT
GLOVE BIO SURGEON STRL SZ8 (GLOVE) ×4 IMPLANT
GLOVE BIOGEL PI IND STRL 8 (GLOVE) ×2 IMPLANT
GLOVE BIOGEL PI INDICATOR 8 (GLOVE) ×2
GLOVE ORTHO TXT STRL SZ7.5 (GLOVE) ×4 IMPLANT
GOWN STRL REUS W/TWL XL LVL3 (GOWN DISPOSABLE) ×4 IMPLANT
HANDPIECE ABLA MINERVA ENDO (MISCELLANEOUS) ×4 IMPLANT
IV NS IRRIG 3000ML ARTHROMATIC (IV SOLUTION) ×4 IMPLANT
KIT TURNOVER CYSTO (KITS) ×4 IMPLANT
LOOP CUTTING BIPOLAR 21FR (ELECTRODE) IMPLANT
NEEDLE INSUFFLATION 120MM (ENDOMECHANICALS) ×4 IMPLANT
NS IRRIG 1000ML POUR BTL (IV SOLUTION) IMPLANT
PACK LAPAROSCOPY BASIN (CUSTOM PROCEDURE TRAY) ×4 IMPLANT
PACK TRENDGUARD 450 HYBRID PRO (MISCELLANEOUS) ×2 IMPLANT
PACK VAGINAL MINOR WOMEN LF (CUSTOM PROCEDURE TRAY) ×4 IMPLANT
PAD OB MATERNITY 4.3X12.25 (PERSONAL CARE ITEMS) ×4 IMPLANT
PAD PREP 24X48 CUFFED NSTRL (MISCELLANEOUS) ×4 IMPLANT
PROTECTOR NERVE ULNAR (MISCELLANEOUS) ×4 IMPLANT
SET IRRIG TUBING LAPAROSCOPIC (IRRIGATION / IRRIGATOR) IMPLANT
SHEARS HARMONIC ACE PLUS 36CM (ENDOMECHANICALS) IMPLANT
SUT VICRYL 0 UR6 27IN ABS (SUTURE) ×4 IMPLANT
SUT VICRYL 4-0 PS2 18IN ABS (SUTURE) ×4 IMPLANT
SYS BAG RETRIEVAL 10MM (BASKET)
SYSTEM BAG RETRIEVAL 10MM (BASKET) IMPLANT
TOWEL OR 17X24 6PK STRL BLUE (TOWEL DISPOSABLE) ×8 IMPLANT
TRENDGUARD 450 HYBRID PRO PACK (MISCELLANEOUS) ×4
TROCAR BLADELESS OPT 5 100 (ENDOMECHANICALS) ×8 IMPLANT
TROCAR XCEL BLUNT TIP 100MML (ENDOMECHANICALS) IMPLANT
TROCAR XCEL NON-BLD 11X100MML (ENDOMECHANICALS) ×4 IMPLANT
TUBING AQUILEX INFLOW (TUBING) ×4 IMPLANT
TUBING AQUILEX OUTFLOW (TUBING) ×4 IMPLANT
TUBING INSUF HEATED (TUBING) ×4 IMPLANT
WARMER LAPAROSCOPE (MISCELLANEOUS) ×4 IMPLANT
WATER STERILE IRR 500ML POUR (IV SOLUTION) IMPLANT

## 2017-09-01 NOTE — Anesthesia Preprocedure Evaluation (Signed)
Anesthesia Evaluation  Patient identified by MRN, date of birth, ID band Patient awake    Reviewed: Allergy & Precautions, NPO status   History of Anesthesia Complications (+) PONV  Airway Mallampati: II  TM Distance: >3 FB     Dental   Pulmonary sleep apnea ,    breath sounds clear to auscultation       Cardiovascular hypertension,  Rhythm:Regular Rate:Normal     Neuro/Psych    GI/Hepatic negative GI ROS, Neg liver ROS,   Endo/Other    Renal/GU negative Renal ROS     Musculoskeletal   Abdominal   Peds  Hematology   Anesthesia Other Findings   Reproductive/Obstetrics                             Anesthesia Physical Anesthesia Plan  ASA: II  Anesthesia Plan: General   Post-op Pain Management:    Induction: Intravenous  PONV Risk Score and Plan: Treatment may vary due to age or medical condition, Ondansetron, Dexamethasone and Midazolam  Airway Management Planned: Oral ETT  Additional Equipment:   Intra-op Plan:   Post-operative Plan: Extubation in OR  Informed Consent: I have reviewed the patients History and Physical, chart, labs and discussed the procedure including the risks, benefits and alternatives for the proposed anesthesia with the patient or authorized representative who has indicated his/her understanding and acceptance.   Dental advisory given  Plan Discussed with: CRNA and Anesthesiologist  Anesthesia Plan Comments:         Anesthesia Quick Evaluation

## 2017-09-01 NOTE — Discharge Instructions (Signed)
Routine instructions for laparoscopy and endometrial ablation  DISCHARGE INSTRUCTIONS:  The following instructions have been prepared to help you care for yourself upon your return home.   Personal hygiene:  Use sanitary pads for vaginal drainage, not tampons.  Shower the day after your procedure.  NO tub baths, pools or Jacuzzis for 2-3 weeks.  Wipe front to back after using the bathroom.  Activity and limitations:  Do NOT drive or operate any equipment for 24 hours. The effects of anesthesia are still present and drowsiness may result.  Do NOT rest in bed all day.  Walking is encouraged.  Walk up and down stairs slowly.  You may resume your normal activity in one to two days or as indicated by your physician.  Sexual activity: NO intercourse for at least 2 weeks after the procedure, or as indicated by your physician.  Diet: Eat a light meal as desired this evening. You may resume your usual diet tomorrow.  Return to work: You may resume your work activities in one to two days or as indicated by your doctor.  What to expect after your surgery: Expect to have vaginal bleeding/discharge for 2-3 days and spotting for up to 10 days. It is not unusual to have soreness for up to 1-2 weeks. You may have a slight burning sensation when you urinate for the first day. Mild cramps may continue for a couple of days. You may have a regular period in 2-6 weeks.  Call your doctor for any of the following:  Excessive vaginal bleeding, saturating and changing one pad every hour.  Inability to urinate 6 hours after discharge from hospital.  Pain not relieved by pain medication.  Fever of 100.4 F or greater.  Unusual vaginal discharge or odor.   Laparoscopy, Care After Refer to this sheet in the next few weeks. These instructions provide you with information about caring for yourself after your procedure. Your health care provider may also give you more specific instructions. Your  treatment has been planned according to current medical practices, but problems sometimes occur. Call your health care provider if you have any problems or questions after your procedure. What can I expect after the procedure? After the procedure, it is common to have:  A sore throat.  Discomfort in your shoulder.  Mild discomfort or cramping in your abdomen.  Gas pains.  Pain or soreness in the area where the surgical cut (incision) was made.  A bloated feeling.  Tiredness.  Nausea.  Vomiting.  Follow these instructions at home: Medicines  Take over-the-counter and prescription medicines only as told by your health care provider.  Do not take aspirin because it can cause bleeding.  Do not drive or operate heavy machinery while taking prescription pain medicine. Activity  Rest for the rest of the day.  Return to your normal activities as told by your health care provider. Ask your health care provider what activities are safe for you. Incision care   Follow instructions from your health care provider about how to take care of your incision. Make sure you: ? Wash your hands with soap and water before you change your bandage (dressing). If soap and water are not available, use hand sanitizer. ? Change your dressing as told by your health care provider. ? Leave stitches (sutures) in place. They may need to stay in place for 2 weeks or longer.  Check your incision area every day for signs of infection. Check for: ? More redness, swelling, or pain. ?  More fluid or blood. ? Warmth. ? Pus or a bad smell. Other Instructions  Do not take baths, swim, or use a hot tub until your health care provider approves. You may take showers.  Keep all follow-up visits as told by your health care provider. This is important.  Have someone help you with your daily household tasks for the first few days. Contact a health care provider if:  You have more redness, swelling, or pain  around your incision.  Your incision feels warm to the touch.  You have pus or a bad smell coming from your incision.  The edges of your incision break open after the sutures have been removed.  Your pain does not improve after 2-3 days.  You have a rash.  You repeatedly become dizzy or light-headed.  Your pain medicine is not helping.  You are constipated. Get help right away if:  You have a fever.  You faint.  You have increasing pain in your abdomen.  You have severe pain in one or both of your shoulders.  You have fluid or blood coming from your sutures or from your vagina.  You have shortness of breath or difficulty breathing.  You have chest pain or leg pain.  You have ongoing nausea, vomiting, or diarrhea. This information is not intended to replace advice given to you by your health care provider. Make sure you discuss any questions you have with your health care provider. Document Released: 10/15/2004 Document Revised: 08/31/2015 Document Reviewed: 03/08/2015 Elsevier Interactive Patient Education  2018 [employer] Anesthesia Home Care Instructions  Activity: Get plenty of rest for the remainder of the day. A responsible individual must stay with you for 24 hours following the procedure.  For the next 24 hours, DO NOT: -Drive a car -Paediatric nurse -Drink alcoholic beverages -Take any medication unless instructed by your physician -Make any legal decisions or sign important papers.  Meals: Start with liquid foods such as gelatin or soup. Progress to regular foods as tolerated. Avoid greasy, spicy, heavy foods. If nausea and/or vomiting occur, drink only clear liquids until the nausea and/or vomiting subsides. Call your physician if vomiting continues.  Special Instructions/Symptoms: Your throat may feel dry or sore from the anesthesia or the breathing tube placed in your throat during surgery. If this causes discomfort, gargle with warm salt  water. The discomfort should disappear within 24 hours.  If you had a scopolamine patch placed behind your ear for the management of post- operative nausea and/or vomiting:  1. The medication in the patch is effective for 72 hours, after which it should be removed.  Wrap patch in a tissue and discard in the trash. Wash hands thoroughly with soap and water. 2. You may remove the patch earlier than 72 hours if you experience unpleasant side effects which may include dry mouth, dizziness or visual disturbances. 3. Avoid touching the patch. Wash your hands with soap and water after contact with the patch.

## 2017-09-01 NOTE — Transfer of Care (Signed)
Immediate Anesthesia Transfer of Care Note  Patient: Emily Nolan  Procedure(s) Performed: LAPAROSCOPIC BILATERAL SALPINGECTOMY (Bilateral ) ENDOMETRIAL ABLATION (N/A )  Patient Location: PACU  Anesthesia Type:General  Level of Consciousness: awake, alert , oriented and patient cooperative  Airway & Oxygen Therapy: Patient Spontanous Breathing and Patient connected to nasal cannula oxygen  Post-op Assessment: Report given to RN and Post -op Vital signs reviewed and stable  Post vital signs: Reviewed and stable  Last Vitals:  Vitals Value Taken Time  BP 151/101 09/01/2017  9:27 AM  Temp    Pulse 113 09/01/2017  9:29 AM  Resp 20 09/01/2017  9:29 AM  SpO2 100 % 09/01/2017  9:29 AM  Vitals shown include unvalidated device data.  Last Pain:  Vitals:   09/01/17 0543  TempSrc: Oral         Complications: No apparent anesthesia complications

## 2017-09-01 NOTE — Op Note (Signed)
Preoperative diagnosis: Pelvic pain,  menorrhagia Postoperative diagnosis: Same  Procedure: Laparoscopy with bilateral salpingectomy and removal of Essure coils, hysteroscopy, Minerva endometrial ablation Surgeon: Cheri Fowler M.D.  Anesthesia: Gen. Endotracheal tube  Findings: She had a normal abdomen and pelvis with normal uterus tubes and ovaries, normal endometrial cavity  Specimens: Bilateral fallopian tubes and Essure coils Estimated blood loss: Minimal  Fluid deficit through hysteroscope:  160 cc Complications: None  Procedure in detail:   The patient was taken to the operating room and placed in the dorsosupine position. General anesthesia was induced. Her legs were placed in mobile stirrups and her left arm was tucked to her side. Abdomen perineum and vagina were then prepped and draped in the usual sterile fashion, a Hulka tenaculum was applied to the cervix for uterine manipulation. Infraumbilical skin was then infiltrated with quarter percent Marcaine and a 1 cm vertical incision was made. The veress needle was inserted into the peritoneal cavity and placement confirmed by the water drop test and an opening pressure of 6 mm of mercury. CO2 was insufflated to a pressure of 12 mm of mercury and the veress needle was removed. A 10/28mm disposable trocar was then introduced with direct visualization with the laparoscope. A 5 mm port was then placed on each side also under direct visualization. Careful and thorough inspection revealed the above-mentioned findings with normal anatomy, no source for her pain was identified.  Both fallopian tubes were identified and grasped and elevated by their fimbria.  I used bipolar cautery to come through mesosalpinx and the proximal portion of the tube.  Scissors were used for resection.  At the uterine cornu I  was able to come through and on both sides and was trying to isolate the Essure coil but ended up snipping through them and removing the tubes.  I  was able to grasp the remaining Essure coil in the cornu and grasp it and remove it from the cornu.  Both fallopian tubes with a paratubal cyst on the left and all remaining Essure coils were removed without difficulty.  Careful inspection revealed no further evidence of coil in the tube and cornu were hemostatic.  No further source for p[ain was identified. The 5 mm ports were removed under direct visualization. All gas was allowed to deflate from the abdomen and the umbilical trocar was removed.  A figure-of-eight suture of 0 Vicryl was placed through the fascia at the umbilicus.  Skin incisions were then closed with interrupted subcuticular sutures of 4-0 Vicryl followed by Dermabond.   Attention was now turned vaginally.    Her legs were elevated in the stirrups.  The Hulka tenaculum was removed.  A Graves speculum was inserted into the vagina and the anterior lip of the cervix was grasped with a single-tooth tenaculum. The paracervical block was then performed with a total of 16 cc 1% lidocaine. Uterus then sounded to 10 cm. Cervix was easily dilated to size 23 dilator. The observer hysteroscope was inserted and good visualization was achieved. The endometrial cavity was normal with no fibroids or significant polyps. The hysteroscope was removed. The cervix was further dilated to a size 7 and size 8 Hegar dilator measuring the cervix a 4 cm. The Minerva device was inserted and deployed properly. The cavity integrity test passed. Endometrial ablation was performed without difficulty. The device was then allowed to cool for about 30 seconds and was removed. Hysteroscopy was then performed which revealed good global endometrial ablation and still no lesions. Hysteroscope  and fluid were then removed. The single-tooth tenaculum was removed from the cervix. Bleeding was controlled with pressure. All instruments were then removed from the vagina. The patient tolerated the procedure well and was taken to the recovery  in stable condition. Counts were correct, she received no antibiotics, she had PAS hose on throughout the procedure.

## 2017-09-01 NOTE — Anesthesia Postprocedure Evaluation (Signed)
Anesthesia Post Note  Patient: Emily Nolan  Procedure(s) Performed: LAPAROSCOPIC BILATERAL SALPINGECTOMY (Bilateral ) ENDOMETRIAL ABLATION (N/A )     Patient location during evaluation: PACU Anesthesia Type: General Level of consciousness: awake Pain management: pain level controlled Vital Signs Assessment: post-procedure vital signs reviewed and stable Respiratory status: spontaneous breathing Cardiovascular status: stable Anesthetic complications: no    Last Vitals:  Vitals:   09/01/17 1045 09/01/17 1100  BP: (!) 142/87 130/85  Pulse: 93 91  Resp: 11 10  Temp:    SpO2: 93% 94%    Last Pain:  Vitals:   09/01/17 1100  TempSrc:   PainSc: 4                  Clifton Safley

## 2017-09-01 NOTE — Anesthesia Procedure Notes (Signed)
Procedure Name: Intubation Date/Time: 09/01/2017 7:34 AM Performed by: Raenette Rover, CRNA Pre-anesthesia Checklist: Patient identified, Emergency Drugs available, Suction available and Patient being monitored Patient Re-evaluated:Patient Re-evaluated prior to induction Oxygen Delivery Method: Circle system utilized Preoxygenation: Pre-oxygenation with 100% oxygen Induction Type: IV induction Ventilation: Mask ventilation without difficulty Laryngoscope Size: Mac and 3 Grade View: Grade I Tube type: Oral Tube size: 7.0 mm Number of attempts: 1 Airway Equipment and Method: Stylet Placement Confirmation: ETT inserted through vocal cords under direct vision,  positive ETCO2,  breath sounds checked- equal and bilateral and CO2 detector Secured at: 22 cm Tube secured with: Tape Dental Injury: Injury to lip  Comments: Small cut on lower right lip after DL--pressure held.

## 2017-09-01 NOTE — Interval H&P Note (Signed)
History and Physical Interval Note:  09/01/2017 7:15 AM  Emily Nolan  has presented today for surgery, with the diagnosis of Menorrhagia, Pain in pelvis  The various methods of treatment have been discussed with the patient and family. After consideration of risks, benefits and other options for treatment, the patient has consented to  Procedure(s) with comments: LAPAROSCOPIC BILATERAL SALPINGECTOMY (Bilateral) - DR request RNFA ENDOMETRIAL ABLATION (N/A) - USING MINERVA as a surgical intervention .  The patient's history has been reviewed, patient examined, no change in status, stable for surgery.  I have reviewed the patient's chart and labs.  Questions were answered to the patient's satisfaction.     Blane Ohara Harlan Ervine

## 2017-09-05 ENCOUNTER — Encounter (HOSPITAL_BASED_OUTPATIENT_CLINIC_OR_DEPARTMENT_OTHER): Payer: Self-pay | Admitting: Obstetrics and Gynecology

## 2017-10-06 ENCOUNTER — Other Ambulatory Visit: Payer: Self-pay | Admitting: Internal Medicine

## 2017-10-06 DIAGNOSIS — F419 Anxiety disorder, unspecified: Secondary | ICD-10-CM

## 2017-10-06 MED ORDER — ALPRAZOLAM 0.5 MG PO TABS: ORAL_TABLET | ORAL | 2 refills | Status: DC

## 2017-12-13 NOTE — Progress Notes (Signed)
FOLLOW UP  Assessment and Plan:   Labile hypertension - start new medication, DASH diet, exercise and monitor at home. Call if greater than 130/80.  -     metoprolol succinate (TOPROL XL) 25 MG 24 hr tablet; Take 1 tablet (25 mg total) by mouth at bedtime. -     CBC with Differential/Platelet -     COMPLETE METABOLIC PANEL WITH GFR -     TSH  Mixed hyperlipidemia check lipids decrease fatty foods increase activity.  -     Lipid panel  Anxiety Continue counseling, limit xanax -     metoprolol succinate (TOPROL XL) 25 MG 24 hr tablet; Take 1 tablet (25 mg total) by mouth at bedtime.  Sleep apnea with hypersomnolence continue mouth piece  Obesity (BMI 30.0-34.9) - follow up 3 months for progress monitoring - increase veggies, decrease carbs - long discussion about weight loss, diet, and exercise   Continue diet and meds as discussed. Further disposition pending results of labs. Over 30 minutes of exam, counseling, chart review, and critical decision making was performed  Future Appointments  Date Time Provider Arnold  03/19/2018  3:00 PM Vicie Mutters, PA-C [id number] None     HPI 42 y.o. female  presents for 3 month follow up on hypertension, cholesterol, prediabetes, and vitamin D deficiency.   Her blood pressure has not been controlled at home, has checked it several times at home and at appointments with it elevated, today their BP is BP: (!) 142/100 . She is trying to get mouth piece for her sleep apnea, now wearing it more. She has anxiety, she will take xanax 1/2 a few times a week.  BP Readings from Last 3 Encounters:  12/14/17 (!) 142/100  09/01/17 139/77  06/16/17 122/84   BMI is Body mass index is 30.51 kg/m., she is working on diet and exercise. Wt Readings from Last 3 Encounters:  12/14/17 175 lb (79.4 kg)  09/01/17 172 lb 14.4 oz (78.4 kg)  06/16/17 173 lb (78.5 kg)    She does not workout. She denies chest pain, shortness of breath,  dizziness.   She  is not  on cholesterol medication and denies myalgias. Her cholesterol is at goal. The cholesterol last visit was:   Lab Results  Component Value Date   CHOL 144 06/07/2017   HDL 44 (L) 06/07/2017   LDLCALC 73 06/07/2017   TRIG 177 (H) 06/07/2017   CHOLHDL 3.3 06/07/2017   Last A1C in the office was:  Lab Results  Component Value Date   HGBA1C 5.1 01/27/2016    Patient is on Vitamin D supplement.   Lab Results  Component Value Date   VD25OH 19 (L) 01/27/2016        Current Medications:  Current Outpatient Medications on File Prior to Visit  Medication Sig  . ALPRAZolam (XANAX) 0.5 MG tablet 1/2-1 tablet as needed up to 2 times daily for anxiety  . ibuprofen (ADVIL,MOTRIN) 200 MG tablet Take 600 mg by mouth every 6 (six) hours as needed for headache or moderate pain.  . valACYclovir (VALTREX) 500 MG tablet Take 1 tablet (500 mg total) by mouth 2 (two) times daily. (Patient taking differently: Take 500 mg by mouth 2 (two) times daily as needed. Prn)   No current facility-administered medications on file prior to visit.     Medical History:  Past Medical History:  Diagnosis Date  . HSV-2 infection   . Mild obstructive sleep apnea    study 07-12-2014  in epic,  mild osa w/ hypopnea AHI 13/hr--- 08-25-2017 per pt uses dental device  . Pelvic pain in female   . PONV (postoperative nausea and vomiting)    Allergies:  Allergies  Allergen Reactions  . Codeine Nausea And Vomiting  . Azithromycin Nausea And Vomiting  . Chlorhexidine Gluconate Rash    CHG     Review of Systems:  Review of Systems  Constitutional: Negative.  Negative for chills, fever and malaise/fatigue.  HENT: Negative.  Negative for tinnitus.   Eyes: Negative.  Negative for blurred vision and double vision.  Respiratory: Negative.  Negative for shortness of breath.   Cardiovascular: Negative.  Negative for chest pain.  Gastrointestinal: Negative.   Genitourinary: Negative.    Musculoskeletal: Negative.   Skin: Negative.   Neurological: Negative.  Negative for headaches.  Psychiatric/Behavioral: Negative.      Family history- Review and unchanged Social history- Review and unchanged Physical Exam: BP (!) 142/100   Pulse 75   Temp 98.7 F (37.1 C)   Resp 16   Ht 5' 3.5" (1.613 m)   Wt 175 lb (79.4 kg)   LMP 11/20/2017   SpO2 98%   BMI 30.51 kg/m  Wt Readings from Last 3 Encounters:  12/14/17 175 lb (79.4 kg)  09/01/17 172 lb 14.4 oz (78.4 kg)  06/16/17 173 lb (78.5 kg)   General Appearance: Well nourished, in no apparent distress. Eyes: PERRLA, EOMs, conjunctiva no swelling or erythema Sinuses: No Frontal/maxillary tenderness ENT/Mouth: Ext aud canals clear, TMs without erythema, bulging. No erythema, swelling, or exudate on post pharynx.  Tonsils not swollen or erythematous. Hearing normal.  Neck: Supple, thyroid normal.  Respiratory: Respiratory effort normal, BS equal bilaterally without rales, rhonchi, wheezing or stridor.  Cardio: RRR with no MRGs. Brisk peripheral pulses without edema.  Abdomen: Soft, + BS,  Non tender, no guarding, rebound, hernias, masses. Lymphatics: Non tender without lymphadenopathy.  Musculoskeletal: Full ROM, 5/5 strength, Normal gait Skin: Warm, dry without rashes, lesions, ecchymosis.  Neuro: Cranial nerves intact. Normal muscle tone, no cerebellar symptoms. Psych: Awake and oriented X 3, normal affect, Insight and Judgment appropriate.    Vicie Mutters, PA-C 12:26 PM [facility]

## 2017-12-14 ENCOUNTER — Ambulatory Visit: Payer: Self-pay | Admitting: Physician Assistant

## 2017-12-14 ENCOUNTER — Encounter: Payer: Self-pay | Admitting: Physician Assistant

## 2017-12-14 ENCOUNTER — Ambulatory Visit (INDEPENDENT_AMBULATORY_CARE_PROVIDER_SITE_OTHER): Payer: 59 | Admitting: Physician Assistant

## 2017-12-14 VITALS — BP 142/100 | HR 75 | Temp 98.7°F | Resp 16 | Ht 63.5 in | Wt 175.0 lb

## 2017-12-14 DIAGNOSIS — G4710 Hypersomnia, unspecified: Secondary | ICD-10-CM | POA: Diagnosis not present

## 2017-12-14 DIAGNOSIS — F419 Anxiety disorder, unspecified: Secondary | ICD-10-CM | POA: Diagnosis not present

## 2017-12-14 DIAGNOSIS — R0989 Other specified symptoms and signs involving the circulatory and respiratory systems: Secondary | ICD-10-CM | POA: Diagnosis not present

## 2017-12-14 DIAGNOSIS — G4730 Sleep apnea, unspecified: Secondary | ICD-10-CM

## 2017-12-14 DIAGNOSIS — E782 Mixed hyperlipidemia: Secondary | ICD-10-CM

## 2017-12-14 DIAGNOSIS — E66811 Obesity, class 1: Secondary | ICD-10-CM

## 2017-12-14 DIAGNOSIS — E669 Obesity, unspecified: Secondary | ICD-10-CM

## 2017-12-14 LAB — CBC WITH DIFFERENTIAL/PLATELET
BASOS ABS: 40 {cells}/uL (ref 0–200)
BASOS PCT: 0.6 %
EOS PCT: 1.4 %
Eosinophils Absolute: 92 cells/uL (ref 15–500)
HEMATOCRIT: 40.6 % (ref 35.0–45.0)
HEMOGLOBIN: 13.6 g/dL (ref 11.7–15.5)
LYMPHS ABS: 2066 {cells}/uL (ref 850–3900)
MCH: 29.3 pg (ref 27.0–33.0)
MCHC: 33.5 g/dL (ref 32.0–36.0)
MCV: 87.5 fL (ref 80.0–100.0)
MPV: 10.5 fL (ref 7.5–12.5)
Monocytes Relative: 6.1 %
NEUTROS ABS: 4000 {cells}/uL (ref 1500–7800)
Neutrophils Relative %: 60.6 %
Platelets: 252 10*3/uL (ref 140–400)
RBC: 4.64 10*6/uL (ref 3.80–5.10)
RDW: 14.6 % (ref 11.0–15.0)
Total Lymphocyte: 31.3 %
WBC mixed population: 403 cells/uL (ref 200–950)
WBC: 6.6 10*3/uL (ref 3.8–10.8)

## 2017-12-14 LAB — LIPID PANEL
Cholesterol: 142 mg/dL (ref <200)
HDL: 51 mg/dL (ref >50)
LDL Cholesterol (Calc): 71 mg/dL (calc)
NON-HDL CHOLESTEROL (CALC): 91 mg/dL (ref <130)
TRIGLYCERIDES: 118 mg/dL (ref <150)
Total CHOL/HDL Ratio: 2.8 (calc) (ref <5.0)

## 2017-12-14 LAB — COMPLETE METABOLIC PANEL WITH GFR
AG Ratio: 1.2 (calc) (ref 1.0–2.5)
ALBUMIN MSPROF: 4.2 g/dL (ref 3.6–5.1)
ALT: 16 U/L (ref 6–29)
AST: 14 U/L (ref 10–30)
Alkaline phosphatase (APISO): 54 U/L (ref 33–115)
BUN: 8 mg/dL (ref 7–25)
CALCIUM: 8.9 mg/dL (ref 8.6–10.2)
CHLORIDE: 104 mmol/L (ref 98–110)
CO2: 26 mmol/L (ref 20–32)
Creat: 0.62 mg/dL (ref 0.50–1.10)
GFR, EST AFRICAN AMERICAN: 132 mL/min/{1.73_m2} (ref >=60)
GFR, Est Non African American: 114 mL/min/{1.73_m2} (ref >=60)
GLOBULIN: 3.4 g/dL (ref 1.9–3.7)
Glucose, Bld: 91 mg/dL (ref 65–99)
Potassium: 4.1 mmol/L (ref 3.5–5.3)
Sodium: 138 mmol/L (ref 135–146)
Total Bilirubin: 0.3 mg/dL (ref 0.2–1.2)
Total Protein: 7.6 g/dL (ref 6.1–8.1)

## 2017-12-14 LAB — TSH: TSH: 1.63 mIU/L

## 2017-12-14 MED ORDER — METOPROLOL SUCCINATE ER 25 MG PO TB24: 25.0000 mg | ORAL_TABLET | Freq: Every day | ORAL | 2 refills | Status: DC

## 2017-12-14 NOTE — Patient Instructions (Addendum)
We are starting you on a new medication. Here is some general information.   1) Medications are not always the solution, any medication we put you on there is always a hope to come off of it depending on the medication. For example, If we start you on a hypertension medication, I would love to get you off of it and we can address that every visit if you wish. I'm always willing to try to get you off a medication unless I really feel that it is beneficial for you.   2) With what I mentioned above, there is no magic pill, I need you to put in the work to get off any medication you wish to not be on. So things to help is move a little each day, drink plenty of water, eat veggies/fruit, and don't smoke.   3) Every medication has a potential for a side effect. Even over the counter medications have a potential side effect. So I start you on a medication and there is something different over the next 1-3 months let me know. It is always possible that it can be the medication.   Here is some information below about your new medication.  If you have any concerns or questions please contact the office and not Dr. Essie Hart. =) Remember also that during a study ANY symptoms someone has can be listed as a side effect even if it was not caused by the medication.   Metoprolol extended-release tablets What is this medicine? METOPROLOL (me TOE proe lole) is a beta-blocker. Beta-blockers reduce the workload on the heart and help it to beat more regularly. This medicine is used to treat high blood pressure and to prevent chest pain. It is also used to after a heart attack and to prevent an additional heart attack from occurring. This medicine may be used for other purposes; ask your health care provider or pharmacist if you have questions. COMMON BRAND NAME(S): toprol, Toprol XL What should I tell my health care provider before I take this medicine? They need to know if you have any of these  conditions: -diabetes -heart or vessel disease like slow heart rate, worsening heart failure, heart block, sick sinus syndrome or Raynaud's disease -kidney disease -liver disease -lung or breathing disease, like asthma or emphysema -pheochromocytoma -thyroid disease -an unusual or allergic reaction to metoprolol, other beta-blockers, medicines, foods, dyes, or preservatives -pregnant or trying to get pregnant -breast-feeding How should I use this medicine? Take this medicine by mouth with a glass of water. Follow the directions on the prescription label. Do not crush or chew. Take this medicine with or immediately after meals. Take your doses at regular intervals. Do not take more medicine than directed. Do not stop taking this medicine suddenly. This could lead to serious heart-related effects. Talk to your pediatrician regarding the use of this medicine in children. While this drug may be prescribed for children as young as 6 years for selected conditions, precautions do apply. Overdosage: If you think you have taken too much of this medicine contact a poison control center or emergency room at once. NOTE: This medicine is only for you. Do not share this medicine with others. What if I miss a dose? If you miss a dose, take it as soon as you can. If it is almost time for your next dose, take only that dose. Do not take double or extra doses. What may interact with this medicine? This medicine may interact with the following medications: -  certain medicines for blood pressure, heart disease, irregular heart beat -certain medicines for depression, like monoamine oxidase (MAO) inhibitors, fluoxetine, or paroxetine -clonidine -dobutamine -epinephrine -isoproterenol -reserpine This list may not describe all possible interactions. Give your health care provider a list of all the medicines, herbs, non-prescription drugs, or dietary supplements you use. Also tell them if you smoke, drink alcohol,  or use illegal drugs. Some items may interact with your medicine. What should I watch for while using this medicine? Visit your doctor or health care professional for regular check ups. Contact your doctor right away if your symptoms worsen. Check your blood pressure and pulse rate regularly. Ask your health care professional what your blood pressure and pulse rate should be, and when you should contact them. You may get drowsy or dizzy. Do not drive, use machinery, or do anything that needs mental alertness until you know how this medicine affects you. Do not sit or stand up quickly, especially if you are an older patient. This reduces the risk of dizzy or fainting spells. Contact your doctor if these symptoms continue. Alcohol may interfere with the effect of this medicine. Avoid alcoholic drinks. What side effects may I notice from receiving this medicine? Side effects that you should report to your doctor or health care professional as soon as possible: -allergic reactions like skin rash, itching or hives -cold or numb hands or feet -depression -difficulty breathing -faint -fever with sore throat -irregular heartbeat, chest pain -rapid weight gain -swollen legs or ankles Side effects that usually do not require medical attention (report to your doctor or health care professional if they continue or are bothersome): -anxiety or nervousness -change in sex drive or performance -dry skin -headache -nightmares or trouble sleeping -short term memory loss -stomach upset or diarrhea -unusually tired This list may not describe all possible side effects. Call your doctor for medical advice about side effects. You may report side effects to FDA at 1-800-FDA-1088. Where should I keep my medicine? Keep out of the reach of children. Store at room temperature between 15 and 30 degrees C (59 and 86 degrees F). Throw away any unused medicine after the expiration date. NOTE: This sheet is a summary. It  may not cover all possible information. If you have questions about this medicine, talk to your doctor, pharmacist, or health care provider.  2018 Elsevier/Gold Standard (2012-11-30 14:41:37)     Counseling services  Here are some numbers below you can try but I suggest calling your insurance and finding out who is in your network and THEN calling those people or looking them up on google.   I'm a big fan of Cognitive Behavioral Therapy, look this up on You tube or check with the therapist you see if they are certified.  This form of therapy helps to teach you skills to better handle with current situation that are causing anxiety or depression.   Check out  Mini habits for weight loss book  2 apps for tracking food is myfitness pal  loseit OR can take picture of your food  8 Critical Weight-Loss Tips That Aren't Diet and Exercise  1. STARVE THE DISTRACTIONS  All too often when we eat, we're also multitasking: watching TV, answering emails, scrolling through social media. These habits are detrimental to having a strong, clear, healthy relationship with food, and they can hinder our ability to make dietary changes.  In order to truly focus on what you're eating, how much you're eating, why you're eating those  specific foods and, most importantly, how those foods make you feel, you need to starve the distractions. That means when you eat, just eat. Focus on your food, the process it went through to end up on your plate, where it came from and how it nourishes you. With this technique, you're more likely to finish a meal feeling satiated.  2.  CONSIDER WHAT YOU'RE NOT WILLING TO DO  This might sound counterintuitive, but it can help provide a "why" when motivation is waning. Declare, in writing, what you are unwilling to do, for example "I am unwilling to be the old dad who cannot play sports with my children".  So consider what you're not willing to accept, write it down, and keep it  at the ready.  3.  STOP LABELING FOOD "GOOD" AND "BAD"  You've probably heard someone say they ate something "bad." Maybe you've even said it yourself.  The trouble with 'bad' foods isn't that they'll send you to the grave after a bite or two. The trouble comes when we eat excessive portions of really calorie-dense foods meal after meal, day after day.  Instead of labeling foods as good or bad, think about which foods you can eat a lot of, and which ones you should just eat a little of. Then, plan ways to eat the foods you really like in portions that fit with your overall goals. A good example of this would be having a slice of pizza alongside a club salad with chicken breast, avocado and a bit of dressing. This is vastly different than 3 slices of pizza, 4 breadsticks with cheese sauce and half of a liter of regular soda.  4.  BRUSH YOUR TEETH AFTER YOU EAT  Getting your mindset in order is important, but sometimes small habits can make a big difference. After eating, you still have the taste of food in their mouth, which often causes people to eat more even if they are full or engage in a nibble or two of dessert.  Brushing your teeth will remove the taste of food from your mouth, and the clean, minty freshness will serve as a cue that mealtime is over.  5.  FOCUS ON CROWDING NOT CUTTING  The most common first step during 'dieting' is to cut. We cut our portion sizes down, we cut out 'bad' foods, we cut out entire food groups. This act of cutting puts [place] and our minds into scarcity mode.  When something is off-limits, even if you're able to avoid it for a while, you could end up bingeing on it later because you've gone so long without it. So, instead of cutting, focus on crowding. If you crowd your plate and fill it up with more foods like veggies and protein, it simply allows less room for the other stuff. In other words, shift your focus away from what you can't eat, and celebrate the foods  that will help you reach your goals.  6.  TAKE TRACKING A STEP FURTHER  Track what you eat, when you ate it, how much you ate and how that food made you feel. Being completely honest with yourself and writing down every single thing that passes through your lips will help you start to notice that maybe you actually do snack, possibly take in more sugar than you thought, eat when you're bored rather than just hungry or maybe that you have a habit of snacking before bed while watching TV.  The difference from simply tracking your  food intake is you're taking into account how food makes you feel, as well as what you're doing while you're eating. This is about becoming more mindful of what, when and why you eat.  7.  PRIORITIZE GOOD SLEEP  One of the strongest risk factors for being overweight is poor sleep. When you're feeling tired, you're more likely to choose unhealthy comfort foods and to skip your workout. Additionally, sleep deprivation may slow down your metabolism. [employer]! Therefore, sleeping 7-8 hours per night can help with weight loss without having to change your diet or increase your physical activity. And if you feel you snore and still wake up tired, talk with me about sleep apnea.  8.  SET ASIDE TIME TO DISCONNECT  Just get out there. Disconnect from the electronics and connect to the elements. Not only will this help reduce stress (a major factor in weight gain) by giving your mind a break from the constant stimulation we've all become so accustomed to, but it may also reprogram your brain to connect with yourself and what you're feeling.

## 2018-03-09 ENCOUNTER — Other Ambulatory Visit: Payer: Self-pay | Admitting: Internal Medicine

## 2018-03-09 DIAGNOSIS — F419 Anxiety disorder, unspecified: Secondary | ICD-10-CM

## 2018-03-15 NOTE — Progress Notes (Signed)
Complete Physical  Assessment and Plan: Emily Nolan was seen today for annual exam.  Diagnoses and all orders for this visit:  Labile hypertension -     CBC with Differential/Platelet -     COMPLETE METABOLIC PANEL WITH GFR -     TSH -     Urinalysis, Routine w reflex microscopic -     Microalbumin / creatinine urine ratio -     EKG 12-Lead -     MICROSCOPIC MESSAGE  Sleep apnea with hypersomnolence  Mixed hyperlipidemia -     Lipid panel  Obesity (BMI 30.0-34.9)  Anxiety -     citalopram (CELEXA) 20 MG tablet; Take 1 tablet (20 mg total) by mouth daily.  Medication management -     Magnesium  Vitamin D deficiency -     VITAMIN D 25 Hydroxy (Vit-D Deficiency, Fractures)  Screening, anemia, deficiency, iron -     Iron,Total/Total Iron Binding Cap -     Vitamin B12  Chest pain, unspecified type -     Ambulatory referral to Cardiology - patient is very anxiety, possible GERD versus anxiety- start on pepcid and celexa- very close follow up - however with family history first MI at 60 in father, chol, sleep apnea will refer to cardio for possible stress test IF they feel it is appropritae -     famotidine (PEPCID) 40 MG tablet; Take 1 tablet (40 mg total) by mouth every evening.  Acne, unspecified acne type -     metroNIDAZOLE (METROCREAM) 0.75 % cream; Apply topically 2 (two) times daily.   Discussed med's effects and SE's. Screening labs and tests as requested with regular follow-up as recommended. Over 40 minutes of exam, counseling, chart review, and complex, high level critical decision making was performed this visit.   HPI  42 y.o. female  presents for a complete physical and follow up for has Sleep apnea with hypersomnolence; Anxiety; Menorrhagia; Hyperlipidemia; Labile hypertension; Obesity (BMI 30.0-34.9); and Pelvic pain in female on their problem list..  She has been seeing a therapist x march, she is taking the xanax AS needed 1/2-1 tablet 2-3 x a week.  Counselor suggests trying a SSRI. She has constant feeling of pressure of her chest, worse several months, very localized at left upper chest, occ move across her chest, one very bad day with a panic attack had left arm shooting pain with pressure. No sweating. Occ dizzy, one episode of vomiting with the pressure.  She has had before associated with her anxiety in the past but past several months has been constant. When she eats, she feels she will choke and can make pressure worse. Has been worse with lying down, worse first thing in the morning.  Had something similar in the past, had normal holter 01/2013. First MI in father at 44, died at 59 from MI, was alcoholic.    She was seen 01/03/2017 for anxiety, she has demanding job/3 kids. Has sleep apnea, has mouth piece with Dr. 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   She is s/p endometrial ablation and BSO and she has felt better since that time.  Lab Results  Component Value Date   IRON 109 03/19/2018   TIBC 353 03/19/2018    Her blood pressure has been controlled at home, BP is doing well with 1/2 pill a day, today their BP is BP: 126/72 She does not workout, she  does some walking. She denies chest pain, shortness of breath, dizziness.   She is not on cholesterol medication and denies myalgias. Her cholesterol is at goal. The cholesterol last visit was:   Lab Results  Component Value Date   CHOL 154 03/19/2018   HDL 45 (L) 03/19/2018   LDLCALC 81 03/19/2018   TRIG 180 (H) 03/19/2018   CHOLHDL 3.4 03/19/2018    Last A1C in the office was:  Lab Results  Component Value Date   HGBA1C 5.1 01/27/2016   Patient is on Vitamin D supplement.   Lab Results  Component Value Date   VD25OH 16 (L) 03/19/2018     BMI is Body mass index is 30.3 kg/m., she is working on diet and exercise. Wt Readings from Last 3 Encounters:  03/19/18 173 lb 12.8 oz (78.8 kg)   12/14/17 175 lb (79.4 kg)  09/01/17 172 lb 14.4 oz (78.4 kg)     Current Medications:  Current Outpatient Medications on File Prior to Visit  Medication Sig Dispense Refill  . ALPRAZolam (XANAX) 0.25 MG tablet Take 1 - 2 to 1 tablet 1 or 2 x /day if needed for Anxiety Attack 60 tablet 0  . ibuprofen (ADVIL,MOTRIN) 200 MG tablet Take 600 mg by mouth every 6 (six) hours as needed for headache or moderate pain.    . metoprolol succinate (TOPROL XL) 25 MG 24 hr tablet Take 1 tablet (25 mg total) by mouth at bedtime. 30 tablet 2  . valACYclovir (VALTREX) 500 MG tablet Take 1 tablet (500 mg total) by mouth 2 (two) times daily. (Patient taking differently: Take 500 mg by mouth 2 (two) times daily as needed. Prn) 60 tablet 1   No current facility-administered medications on file prior to visit.    Allergies:  Allergies  Allergen Reactions  . Codeine Nausea And Vomiting  . Azithromycin Nausea And Vomiting  . Chlorhexidine Gluconate Rash    CHG   Medical History:  She has Sleep apnea with hypersomnolence; Anxiety; Menorrhagia; Hyperlipidemia; Labile hypertension; Obesity (BMI 30.0-34.9); and Pelvic pain in female on their problem list.   Health Maintenance:   Immunization History  Administered Date(s) Administered  . Influenza-Unspecified 01/12/2015  . PPD Test 11/11/2013  . Tdap 04/11/2010   HPV declines Tetanus: 2012 Pneumovax: Prevnar 13:  Flu vaccine: 2019 at work Zostavax:  Patient's last menstrual period was 03/10/2018. Pap: 2018, [facility]er MGM: 2017 will schedule for screening mammgram DEXA: Colonoscopy:  EGD: Sleep study 2016  Patient Care Team: Unk Pinto, MD as PCP - General (Internal Medicine) Rozetta Nunnery, MD as Consulting Physician (Otolaryngology) Cheri Fowler, MD as Consulting Physician (Obstetrics and Gynecology) [place], [place] Eduardo Osier., MD as Attending Physician (Urology) Earlie Server, MD as Consulting Physician (Orthopedic  Surgery)  Surgical History:  She has a past surgical history that includes Mass excision (Left, 01/14/2016); Dilation and curettage of uterus (age 60); Essure tubal ligation (Bilateral, Jan or Feb 2012   dr meisinger office); Laparoscopic bilateral salpingectomy (Bilateral, 09/01/2017); and Endometrial ablation (N/A, 09/01/2017). Family History:  Herfamily history includes Alcohol abuse in her father; Diabetes in her paternal uncle; Heart disease in her father, maternal grandmother, and mother; Hyperlipidemia in her father; Hypertension in her father; Stroke in her paternal uncle. Social History:  She reports that she has never smoked. She has never used smokeless tobacco. She reports that she drinks alcohol. She reports that she does not use drugs.  Review of Systems: Review of Systems  Constitutional: Negative for chills, diaphoresis,  fever, malaise/fatigue and weight loss.  HENT: Negative.   Eyes: Negative for blurred vision and photophobia.  Respiratory: Negative for cough and shortness of breath.   Cardiovascular: Positive for chest pain. Negative for palpitations, orthopnea, claudication, leg swelling and PND.  Gastrointestinal: Positive for heartburn, nausea and vomiting. Negative for abdominal pain, blood in stool, constipation, diarrhea and melena.  Genitourinary: Negative.   Musculoskeletal: Negative for joint pain and myalgias.  Skin: Negative for rash.  Neurological: Negative for dizziness, tingling, focal weakness and headaches.  Psychiatric/Behavioral: Positive for depression. Negative for hallucinations, substance abuse and suicidal ideas. The patient is nervous/anxious and has insomnia (Unrestful sleep).     Physical Exam: Estimated body mass index is 30.3 kg/m as calculated from the following:   Height as of this encounter: 5' 3.5" (1.613 m).   Weight as of this encounter: 173 lb 12.8 oz (78.8 kg). BP 126/72   Pulse 70   Temp 98 F (36.7 C)   Ht 5' 3.5" (1.613 m)   Wt  173 lb 12.8 oz (78.8 kg)   LMP 03/10/2018   SpO2 98%   BMI 30.30 kg/m  General Appearance: Well nourished, in no apparent distress.  Eyes: PERRLA, EOMs, conjunctiva no swelling or erythema, normal fundi and vessels.  Sinuses: No Frontal/maxillary tenderness  ENT/Mouth: Ext aud canals clear, normal light reflex with TMs without erythema, bulging. Good dentition. No erythema, swelling, or exudate on post pharynx. Tonsils not swollen or erythematous. Hearing normal.  Neck: Supple, thyroid normal. No bruits  Respiratory: Respiratory effort normal, BS equal bilaterally without rales, rhonchi, wheezing or stridor.  Cardio: RRR without murmurs, rubs or gallops. Brisk peripheral pulses without edema.  Chest: symmetric, with normal excursions and percussion.  Breasts: defer Abdomen: Soft, nontender, no guarding, rebound, hernias, masses, or organomegaly.  Lymphatics: Non tender without lymphadenopathy.  Genitourinary: defer Musculoskeletal: Full ROM all peripheral extremities,5/5 strength, and normal gait.  Skin: Warm, dry without rashes, lesions, ecchymosis. Neuro: Cranial nerves intact, reflexes equal bilaterally. Normal muscle tone, no cerebellar symptoms. Sensation intact.  Psych: Awake and oriented X 3, normal affect, Insight and Judgment appropriate.   EKG: WNL no ST changes. AORTA SCAN: defer  Vicie Mutters 12:39 PM [facility]

## 2018-03-19 ENCOUNTER — Ambulatory Visit (INDEPENDENT_AMBULATORY_CARE_PROVIDER_SITE_OTHER): Payer: 59 | Admitting: Physician Assistant

## 2018-03-19 ENCOUNTER — Encounter: Payer: Self-pay | Admitting: Physician Assistant

## 2018-03-19 VITALS — BP 126/72 | HR 70 | Temp 98.0°F | Ht 63.5 in | Wt 173.8 lb

## 2018-03-19 DIAGNOSIS — R079 Chest pain, unspecified: Secondary | ICD-10-CM

## 2018-03-19 DIAGNOSIS — E559 Vitamin D deficiency, unspecified: Secondary | ICD-10-CM

## 2018-03-19 DIAGNOSIS — I10 Essential (primary) hypertension: Secondary | ICD-10-CM | POA: Diagnosis not present

## 2018-03-19 DIAGNOSIS — L709 Acne, unspecified: Secondary | ICD-10-CM

## 2018-03-19 DIAGNOSIS — G4710 Hypersomnia, unspecified: Secondary | ICD-10-CM

## 2018-03-19 DIAGNOSIS — Z136 Encounter for screening for cardiovascular disorders: Secondary | ICD-10-CM | POA: Diagnosis not present

## 2018-03-19 DIAGNOSIS — E669 Obesity, unspecified: Secondary | ICD-10-CM

## 2018-03-19 DIAGNOSIS — F419 Anxiety disorder, unspecified: Secondary | ICD-10-CM

## 2018-03-19 DIAGNOSIS — E782 Mixed hyperlipidemia: Secondary | ICD-10-CM

## 2018-03-19 DIAGNOSIS — Z0000 Encounter for general adult medical examination without abnormal findings: Secondary | ICD-10-CM

## 2018-03-19 DIAGNOSIS — G4730 Sleep apnea, unspecified: Secondary | ICD-10-CM

## 2018-03-19 DIAGNOSIS — Z130 Encounter for screening for diseases of the blood and blood-forming organs and certain disorders involving the immune mechanism: Secondary | ICD-10-CM

## 2018-03-19 DIAGNOSIS — R0989 Other specified symptoms and signs involving the circulatory and respiratory systems: Secondary | ICD-10-CM

## 2018-03-19 DIAGNOSIS — Z79899 Other long term (current) drug therapy: Secondary | ICD-10-CM

## 2018-03-19 MED ORDER — METRONIDAZOLE 0.75 % EX CREA: TOPICAL_CREAM | Freq: Two times a day (BID) | CUTANEOUS | 1 refills | Status: DC

## 2018-03-19 MED ORDER — CITALOPRAM HYDROBROMIDE 20 MG PO TABS: 20.0000 mg | ORAL_TABLET | Freq: Every day | ORAL | 2 refills | Status: DC

## 2018-03-19 MED ORDER — FAMOTIDINE 40 MG PO TABS: 40.0000 mg | ORAL_TABLET | Freq: Every evening | ORAL | 0 refills | Status: DC

## 2018-03-19 NOTE — Patient Instructions (Addendum)
HOW TO SCHEDULE A MAMMOGRAM  [facility]  7 a.m.-6:30 p.m., Monday 7 a.m.-5 p.m., Tuesday-Friday Schedule an appointment by calling [phone].  Please see Dr. Toy Cookey.   Cut back on ibuprofen and try tylenol MAXIMUM AMOUNT OF TYLENOL IN A DAY  You can take tylenol (500mg ) or tylenol arthritis (650mg ) with the meloxicam/antiinflammatories. The max you can take of tylenol a day is 3000mg  daily, this is a max of 6 pills a day of the regular tyelnol (500mg ) or a max of 4 a day of the tylenol arthritis (650mg ) as long as no other medications you are taking contain tylenol.       VITAMIN D IS IMPORTANT  Vitamin D goal is between 60-80  Please make sure that you are taking your Vitamin D as directed.   It is very important as a natural anti-inflammatory   helping hair, skin, and nails, as well as reducing stroke and heart attack risk.   It helps your bones and helps with mood.  We want you on at least 5000 IU daily  It also decreases numerous cancer risks so please take it as directed.   Low Vit D is associated with a 200-300% higher risk for CANCER   and 200-300% higher risk for HEART   ATTACK  &  STROKE.    .....................................Marland Kitchen  It is also associated with higher death rate at younger ages,   autoimmune diseases like Rheumatoid arthritis, Lupus, Multiple Sclerosis.     Also many other serious conditions, like depression, Alzheimer's  Dementia, infertility, muscle aches, fatigue, fibromyalgia - just to name a few.  +++++++++++++++++++  Can get liquid vitamin D from [facility] here in [place] at  [facility] alternatives [address], [place], [place] [address] Or you can try earth fare   We are starting you on a new medication. Here is some general information.  We are starting you on celexa  1) Medications are not always the solution, any medication we put you on there is always a hope to come off of it depending on the  medication. For example, If we start you on a hypertension medication, I would love to get you off of it and we can address that every visit if you wish. I'm always willing to try to get you off a medication unless I really feel that it is beneficial for you.   2) With what I mentioned above, there is no magic pill, I need you to put in the work to get off any medication you wish to not be on. So things to help is move a little each day, drink plenty of water, eat veggies/fruit, and don't smoke.   3) Every medication has a potential for a side effect. Even over the counter medications have a potential side effect. So I start you on a medication and there is something different over the next 1-3 months let me know. It is always possible that it can be the medication.   Here is some information below about your new medication.  If you have any concerns or questions please contact the office and not Dr. Essie Hart. =) Remember also that during a study ANY symptoms someone has can be listed as a side effect even if it was not caused by the medication.   Citalopram tablets What is this medicine? CITALOPRAM (sye TAL oh pram) is a medicine for depression. This medicine may be used for other purposes; ask your health care provider  or pharmacist if you have questions. COMMON BRAND NAME(S): Celexa What should I tell my health care provider before I take this medicine? They need to know if you have any of these conditions: -bleeding disorders -bipolar disorder or a family history of bipolar disorder -glaucoma -heart disease -history of irregular heartbeat -kidney disease -liver disease -low levels of magnesium or potassium in the blood -receiving electroconvulsive therapy -seizures -suicidal thoughts, plans, or attempt; a previous suicide attempt by you or a family member -take medicines that treat or prevent blood clots -thyroid disease -an unusual or allergic reaction to citalopram, escitalopram,  other medicines, foods, dyes, or preservatives -pregnant or trying to become pregnant -breast-feeding How should I use this medicine? Take this medicine by mouth with a glass of water. Follow the directions on the prescription label. You can take it with or without food. Take your medicine at regular intervals. Do not take your medicine more often than directed. Do not stop taking this medicine suddenly except upon the advice of your doctor. Stopping this medicine too quickly may cause serious side effects or your condition may worsen. A special MedGuide will be given to you by the pharmacist with each prescription and refill. Be sure to read this information carefully each time. Talk to your pediatrician regarding the use of this medicine in children. Special care may be needed. Patients over 53 years old may have a stronger reaction and need a smaller dose. Overdosage: If you think you have taken too much of this medicine contact a poison control center or emergency room at once. NOTE: This medicine is only for you. Do not share this medicine with others. What if I miss a dose? If you miss a dose, take it as soon as you can. If it is almost time for your next dose, take only that dose. Do not take double or extra doses. What may interact with this medicine? Do not take this medicine with any of the following medications: -certain medicines for fungal infections like fluconazole, itraconazole, ketoconazole, posaconazole, voriconazole -cisapride -dofetilide -dronedarone -escitalopram -linezolid -MAOIs like Carbex, Eldepryl, Marplan, Nardil, and Parnate -methylene blue (injected into a vein) -pimozide -thioridazine -ziprasidone This medicine may also interact with the following medications: -alcohol -amphetamines -aspirin and aspirin-like medicines -carbamazepine -certain medicines for depression, anxiety, or psychotic disturbances -certain medicines for infections like chloroquine,  clarithromycin, erythromycin, furazolidone, isoniazid, pentamidine -certain medicines for migraine headaches like almotriptan, eletriptan, frovatriptan, naratriptan, rizatriptan, sumatriptan, zolmitriptan -certain medicines for sleep -certain medicines that treat or prevent blood clots like dalteparin, enoxaparin, warfarin -cimetidine -diuretics -fentanyl -lithium -methadone -metoprolol -NSAIDs, medicines for pain and inflammation, like ibuprofen or naproxen -omeprazole -other medicines that prolong the QT interval (cause an abnormal heart rhythm) -procarbazine -rasagiline -supplements like St. John's wort, kava kava, valerian -tramadol -tryptophan This list may not describe all possible interactions. Give your health care provider a list of all the medicines, herbs, non-prescription drugs, or dietary supplements you use. Also tell them if you smoke, drink alcohol, or use illegal drugs. Some items may interact with your medicine. What should I watch for while using this medicine? Tell your doctor if your symptoms do not get better or if they get worse. Visit your doctor or health care professional for regular checks on your progress. Because it may take several weeks to see the full effects of this medicine, it is important to continue your treatment as prescribed by your doctor. Patients and their families should watch out for new or worsening thoughts of  suicide or depression. Also watch out for sudden changes in feelings such as feeling anxious, agitated, panicky, irritable, hostile, aggressive, impulsive, severely restless, overly excited and hyperactive, or not being able to sleep. If this happens, especially at the beginning of treatment or after a change in dose, call your health care professional. Dennis Bast may get drowsy or dizzy. Do not drive, use machinery, or do anything that needs mental alertness until you know how this medicine affects you. Do not stand or sit up quickly, especially if  you are an older patient. This reduces the risk of dizzy or fainting spells. Alcohol may interfere with the effect of this medicine. Avoid alcoholic drinks. Your mouth may get dry. Chewing sugarless gum or sucking hard candy, and drinking plenty of water will help. Contact your doctor if the problem does not go away or is severe. What side effects may I notice from receiving this medicine? Side effects that you should report to your doctor or health care professional as soon as possible: -allergic reactions like skin rash, itching or hives, swelling of the face, lips, or tongue -anxious -black, tarry stools -breathing problems -changes in vision -chest pain -confusion -elevated mood, decreased need for sleep, racing thoughts, impulsive behavior -eye pain -fast, irregular heartbeat -feeling faint or lightheaded, falls -feeling agitated, angry, or irritable -hallucination, loss of contact with reality -loss of balance or coordination -loss of memory -painful or prolonged erections -restlessness, pacing, inability to keep still -seizures -stiff muscles -suicidal thoughts or other mood changes -trouble sleeping -unusual bleeding or bruising -unusually weak or tired -vomiting Side effects that usually do not require medical attention (report to your doctor or health care professional if they continue or are bothersome): -change in appetite or weight -change in sex drive or performance -dizziness -headache -increased sweating -indigestion, nausea -tremors This list may not describe all possible side effects. Call your doctor for medical advice about side effects. You may report side effects to FDA at 1-800-FDA-1088. Where should I keep my medicine? Keep out of reach of children. Store at room temperature between 15 and 30 degrees C (59 and 86 degrees F). Throw away any unused medicine after the expiration date. NOTE: This sheet is a summary. It may not cover all possible  information. If you have questions about this medicine, talk to your doctor, pharmacist, or health care provider.  2018 Elsevier/Gold Standard (2015-08-31 13:18:52)   Silent reflux: Not all heartburn burns...Marland KitchenMarland KitchenMarland Kitchen  What is LPR? Laryngopharyngeal reflux (LPR) or silent reflux is a condition in which acid that is made in the stomach travels up the esophagus (swallowing tube) and gets to the throat. Not everyone with reflux has a lot of heartburn or indigestion. In fact, many people with LPR never have heartburn. This is why LPR is called SILENT REFLUX, and the terms "Silent reflux" and "LPR" are often used interchangeably. Because LPR is silent, it is sometimes difficult to diagnose.  How can you tell if you have LPR?  Marland Kitchen Chronic hoarseness- Some people have hoarseness that comes and goes . throat clearing  . Cough . It can cause shortness of breath and cause asthma like symptoms. Marland Kitchen a feeling of a lump in the throat  . difficulty swallowing . a problem with too much nose and throat drainage.  . Some people will feel their esophagus spasm which feels like their heart beating hard and fast, this will usually be after a meal, at rest, or lying down at night.    How do I  treat this? Treatment for LPR should be individualized, and your doctor will suggest the best treatment for you. Generally there are several treatments for LPR: . changing habits and diet to reduce reflux,  . medications to reduce stomach acid, and  . surgery to prevent reflux. Most people with LPR need to modify how and when they eat, as well as take some medication, to get well. Sometimes, nonprescription liquid antacids, such as Maalox, Gelucil and Mylanta are recommended. When used, these antacids should be taken four times each day - one tablespoon one hour after each meal and before bedtime. Dietary and lifestyle changes alone are not often enough to control LPR - medications that reduce stomach acid are also usually  needed. These must be prescribed by our doctor.   TIPS FOR REDUCING REFLUX AND LPR Control your LIFE-STYLE and your DIET! Marland Kitchen If you use tobacco, QUIT.  Marland Kitchen Smoking makes you reflux. After every cigarette you have some LPR.  . Don't wear clothing that is too tight, especially around the waist (trousers, corsets, belts).  . Do not lie down just after eating...in fact, do not eat within three hours of bedtime.  . You should be on a low-fat diet.  . Limit your intake of red meat.  . Limit your intake of butter.  Marland Kitchen Avoid fried foods.  . Avoid chocolate  . Avoid cheese.  Marland Kitchen Avoid eggs. Marland Kitchen Specifically avoid caffeine (especially coffee and tea), soda pop (especially cola) and mints.  . Avoid alcoholic beverages, particularly in the evening.   Costochondritis  Can take tylenol or do salonpas over the counter Costochondritis is swelling and irritation (inflammation) of the tissue (cartilage) that connects your ribs to your breastbone (sternum). This causes pain in the front of your chest. Usually, the pain:  Starts gradually.  Is in more than one rib.  This condition usually goes away on its own over time. Follow these instructions at home:  Do not do anything that makes your pain worse.  If directed, put ice on the painful area: ? Put ice in a plastic bag. ? Place a towel between your skin and the bag. ? Leave the ice on for 20 minutes, 2-3 times a day.  If directed, put heat on the affected area as often as told by your doctor. Use the heat source that your doctor tells you to use, such as a moist heat pack or a heating pad. ? Place a towel between your skin and the heat source. ? Leave the heat on for 20-30 minutes. ? Take off the heat if your skin turns bright red. This is very important if you cannot feel pain, heat, or cold. You may have a greater risk of getting burned.  Take over-the-counter and prescription medicines only as told by your doctor.  Return to your normal  activities as told by your doctor. Ask your doctor what activities are safe for you.  Keep all follow-up visits as told by your doctor. This is important. Contact a doctor if:  You have chills or a fever.  Your pain does not go away or it gets worse.  You have a cough that does not go away. Get help right away if:  You are short of breath. This information is not intended to replace advice given to you by your health care provider. Make sure you discuss any questions you have with your health care provider. Document Released: 09/14/2007 Document Revised: 10/16/2015 Document Reviewed: 07/22/2015 Elsevier Interactive Patient Education  2018 Elsevier Inc.  

## 2018-03-20 LAB — CBC WITH DIFFERENTIAL/PLATELET
Basophils Absolute: 49 cells/uL (ref 0–200)
Basophils Relative: 0.6 %
Eosinophils Absolute: 131 cells/uL (ref 15–500)
Eosinophils Relative: 1.6 %
HCT: 42.1 % (ref 35.0–45.0)
Hemoglobin: 14.2 g/dL (ref 11.7–15.5)
Lymphs Abs: 2739 cells/uL (ref 850–3900)
MCH: 30.7 pg (ref 27.0–33.0)
MCHC: 33.7 g/dL (ref 32.0–36.0)
MCV: 90.9 fL (ref 80.0–100.0)
MPV: 10.5 fL (ref 7.5–12.5)
Monocytes Relative: 6.2 %
Neutro Abs: 4772 cells/uL (ref 1500–7800)
Neutrophils Relative %: 58.2 %
Platelets: 285 10*3/uL (ref 140–400)
RBC: 4.63 10*6/uL (ref 3.80–5.10)
RDW: 12.7 % (ref 11.0–15.0)
Total Lymphocyte: 33.4 %
WBC mixed population: 508 cells/uL (ref 200–950)
WBC: 8.2 10*3/uL (ref 3.8–10.8)

## 2018-03-20 LAB — COMPLETE METABOLIC PANEL WITH GFR
AG Ratio: 1.3 (calc) (ref 1.0–2.5)
ALT: 20 U/L (ref 6–29)
AST: 16 U/L (ref 10–30)
Albumin: 4.3 g/dL (ref 3.6–5.1)
Alkaline phosphatase (APISO): 60 U/L (ref 33–115)
BUN: 9 mg/dL (ref 7–25)
CO2: 28 mmol/L (ref 20–32)
Calcium: 9.5 mg/dL (ref 8.6–10.2)
Chloride: 103 mmol/L (ref 98–110)
Creat: 0.51 mg/dL (ref 0.50–1.10)
GFR, Est African American: 139 mL/min/{1.73_m2} (ref >=60)
GFR, Est Non African American: 120 mL/min/{1.73_m2} (ref >=60)
Globulin: 3.2 g/dL (calc) (ref 1.9–3.7)
Glucose, Bld: 78 mg/dL (ref 65–99)
Potassium: 3.9 mmol/L (ref 3.5–5.3)
Sodium: 140 mmol/L (ref 135–146)
Total Bilirubin: 0.4 mg/dL (ref 0.2–1.2)
Total Protein: 7.5 g/dL (ref 6.1–8.1)

## 2018-03-20 LAB — URINALYSIS, ROUTINE W REFLEX MICROSCOPIC
Bacteria, UA: NONE SEEN /HPF
Bilirubin Urine: NEGATIVE
GLUCOSE, UA: NEGATIVE
Hyaline Cast: NONE SEEN /LPF
Ketones, ur: NEGATIVE
LEUKOCYTES UA: NEGATIVE
NITRITE: NEGATIVE
PH: 6 (ref 5.0–8.0)
Protein, ur: NEGATIVE
SPECIFIC GRAVITY, URINE: 1.028 (ref 1.001–1.03)

## 2018-03-20 LAB — VITAMIN D 25 HYDROXY (VIT D DEFICIENCY, FRACTURES): VIT D 25 HYDROXY: 16 ng/mL — AB (ref 30–100)

## 2018-03-20 LAB — LIPID PANEL
CHOL/HDL RATIO: 3.4 (calc) (ref <5.0)
Cholesterol: 154 mg/dL (ref <200)
HDL: 45 mg/dL — ABNORMAL LOW (ref >50)
LDL CHOLESTEROL (CALC): 81 mg/dL
Non-HDL Cholesterol (Calc): 109 mg/dL (calc) (ref <130)
Triglycerides: 180 mg/dL — ABNORMAL HIGH (ref <150)

## 2018-03-20 LAB — MICROALBUMIN / CREATININE URINE RATIO
Creatinine, Urine: 138 mg/dL (ref 20–275)
Microalb Creat Ratio: 26 mcg/mg creat (ref <30)
Microalb, Ur: 3.6 mg/dL

## 2018-03-20 LAB — IRON, TOTAL/TOTAL IRON BINDING CAP
%SAT: 31 % (calc) (ref 16–45)
IRON: 109 ug/dL (ref 40–190)
TIBC: 353 mcg/dL (calc) (ref 250–450)

## 2018-03-20 LAB — VITAMIN B12: Vitamin B-12: 424 pg/mL (ref 200–1100)

## 2018-03-20 LAB — TSH: TSH: 1.79 mIU/L

## 2018-03-20 LAB — MAGNESIUM: MAGNESIUM: 2 mg/dL (ref 1.5–2.5)

## 2018-04-17 ENCOUNTER — Ambulatory Visit: Payer: 59 | Admitting: Cardiology

## 2018-04-25 ENCOUNTER — Other Ambulatory Visit: Payer: Self-pay | Admitting: Physician Assistant

## 2018-04-25 DIAGNOSIS — Z1231 Encounter for screening mammogram for malignant neoplasm of breast: Secondary | ICD-10-CM

## 2018-05-04 ENCOUNTER — Encounter: Payer: Self-pay | Admitting: Adult Health Nurse Practitioner

## 2018-05-04 ENCOUNTER — Ambulatory Visit (INDEPENDENT_AMBULATORY_CARE_PROVIDER_SITE_OTHER): Payer: 59 | Admitting: Adult Health Nurse Practitioner

## 2018-05-04 VITALS — BP 130/80 | HR 74 | Temp 97.6°F | Ht 63.5 in | Wt 175.0 lb

## 2018-05-04 DIAGNOSIS — R21 Rash and other nonspecific skin eruption: Secondary | ICD-10-CM

## 2018-05-04 DIAGNOSIS — L299 Pruritus, unspecified: Secondary | ICD-10-CM

## 2018-05-04 MED ORDER — PREDNISONE 10 MG (21) PO TBPK: ORAL_TABLET | Freq: Every day | ORAL | 0 refills | Status: DC

## 2018-05-04 NOTE — Progress Notes (Signed)
Assessment and Plan:  Brittane was seen today for acute visit and rash.  Diagnoses and all orders for this visit:  Rash -     predniSONE (STERAPRED UNI-PAK 21 TAB) 10 MG (21) TBPK tablet; Take by mouth daily. Follow directions on Taper pack Unknown origin Discussed possible stress? Continue to monitor new exposures Consider allergy testing if not resolved.  Itching Discussed OTC Cortizone cream Do not scratch May try OTC Zyrtec at night  Call or return with new or worsening symptoms as discussed in appointment.  May contact via office phone [phone] or via [place].   Further disposition pending results of labs. Discussed med's effects and SE's.   Over 30 minutes of exam, counseling, chart review, and critical decision making was performed.   Future Appointments  Date Time Provider [place]  05/17/2018  1:40 PM Buford Dresser, MD [facility]  05/24/2018  4:10 PM [facility] MM 3 [facility]  05/29/2018  8:45 AM Vicie Mutters, PA-C [facility] None  03/21/2019  3:00 PM Vicie Mutters, PA-C [facility] None    ------------------------------------------------------------------------------------------------------------------   HPI 43 y.o.female presents for rash that started last Thursday that started with her hands.   Reports an eruptions of red hot and she noticed first thing in the morning.  She put some ice on the area to help cool it down.  After the redness subsided they became very itching with small raised areas.  Over the the next couple days she has increasing spots on her feet and right hip.  She noticed a tingling and then itching on her feet and hips.  Reports one else in the household has these symptoms.  She denies any use of new personal care products or clothing, bedding.  No new exposures in the home   They have a dog but she stays outside.  Denies ever having this type of rash in the past.    Past Medical History:  Diagnosis Date   . HSV-2 infection   . Mild obstructive sleep apnea    study 07-12-2014 in epic,  mild osa w/ hypopnea AHI 13/hr--- 08-25-2017 per pt uses dental device  . Pelvic pain in female   . PONV (postoperative nausea and vomiting)      Allergies  Allergen Reactions  . Codeine Nausea And Vomiting  . Azithromycin Nausea And Vomiting  . Chlorhexidine Gluconate Rash    CHG    Current Outpatient Medications on File Prior to Visit  Medication Sig  . ALPRAZolam (XANAX) 0.25 MG tablet Take 1 - 2 to 1 tablet 1 or 2 x /day if needed for Anxiety Attack  . citalopram (CELEXA) 20 MG tablet Take 1 tablet (20 mg total) by mouth daily.  . famotidine (PEPCID) 40 MG tablet Take 1 tablet (40 mg total) by mouth every evening.  Marland Kitchen ibuprofen (ADVIL,MOTRIN) 200 MG tablet Take 600 mg by mouth every 6 (six) hours as needed for headache or moderate pain.  . metoprolol succinate (TOPROL XL) 25 MG 24 hr tablet Take 1 tablet (25 mg total) by mouth at bedtime.  . metroNIDAZOLE (METROCREAM) 0.75 % cream Apply topically 2 (two) times daily.  . valACYclovir (VALTREX) 500 MG tablet Take 1 tablet (500 mg total) by mouth 2 (two) times daily. (Patient taking differently: Take 500 mg by mouth 2 (two) times daily as needed. Prn)   No current facility-administered medications on file prior to visit.     ROS: all negative except above.   Physical Exam:  BP 130/80  Pulse 74   Temp 97.6 F (36.4 C)   Ht 5' 3.5" (1.613 m)   Wt 175 lb (79.4 kg)   SpO2 98%   BMI 30.51 kg/m   General Appearance: Well nourished, in no apparent distress. Eyes: PERRLA, EOMs, conjunctiva no swelling or erythema Sinuses: No Frontal/maxillary tenderness ENT/Mouth: Ext aud canals clear, TMs without erythema, bulging. No erythema, swelling, or exudate on post pharynx.  Tonsils not swollen or erythematous. Hearing normal.  Neck: Supple, thyroid normal.  Respiratory: Respiratory effort normal, BS equal bilaterally without rales, rhonchi, wheezing or  stridor.  Cardio: RRR with no MRGs. Brisk peripheral pulses without edema.  Abdomen: Soft, + BS.  Non tender, no guarding, rebound, hernias, masses. Lymphatics: Non tender without lymphadenopathy.  Musculoskeletal: Full ROM, 5/5 strength, normal gait.  Skin: Warm, dry without, lesions, ecchymosis. Small raised single petechia noted to right hip.  Small raised lesions to left hand and right foot. Neuro: Cranial nerves intact. Normal muscle tone, no cerebellar symptoms. Sensation intact.  Psych: Awake and oriented X 3, normal affect, Insight and Judgment appropriate.     Garnet Sierras, NP 10:27 AM [facility]

## 2018-05-04 NOTE — Patient Instructions (Signed)
Continue to monitor for source of rash  Take medication as directed  May use topical Cortizone cream for itching  May use OTC Zyrtec at night for itching  Call or return with new or worsening symptoms.  Consider allergy testing is not resolved.

## 2018-05-09 ENCOUNTER — Ambulatory Visit: Payer: Self-pay | Admitting: Physician Assistant

## 2018-05-11 ENCOUNTER — Other Ambulatory Visit: Payer: Self-pay

## 2018-05-11 DIAGNOSIS — R079 Chest pain, unspecified: Secondary | ICD-10-CM

## 2018-05-11 MED ORDER — FAMOTIDINE 40 MG PO TABS: 40.0000 mg | ORAL_TABLET | Freq: Every evening | ORAL | 3 refills | Status: DC

## 2018-05-17 ENCOUNTER — Ambulatory Visit: Payer: 59 | Admitting: Cardiology

## 2018-05-24 ENCOUNTER — Ambulatory Visit
Admission: RE | Admit: 2018-05-24 | Discharge: 2018-05-24 | Disposition: A | Discharge status: Home / Self Care | Payer: 59 | Source: Ambulatory Visit | Attending: Physician Assistant | Admitting: Physician Assistant

## 2018-05-24 DIAGNOSIS — Z1231 Encounter for screening mammogram for malignant neoplasm of breast: Secondary | ICD-10-CM

## 2018-05-25 ENCOUNTER — Other Ambulatory Visit: Payer: Self-pay | Admitting: Physician Assistant

## 2018-05-25 DIAGNOSIS — R928 Other abnormal and inconclusive findings on diagnostic imaging of breast: Secondary | ICD-10-CM

## 2018-05-27 NOTE — Progress Notes (Signed)
Assessment and Plan:  Rash Improved, get on zyrtec, if not better follow up  Anxiety Continue celexa, will message in 4 weeks since she has just been on x 1 week If not better may try lexapro 10mg   Sleep apnea with hypersomnolence Get mouth piece fixed or need to retry CPAP  Bilateral carpal tunnel syndrome Wear braces at night, try cervical neck pillow, if not better may send for EMG    Future Appointments  Date Time Provider [place]  06/01/2018  8:50 AM [facility] DIAG [place] 2 [facility]  06/01/2018  9:00 AM [facility] [place] 2 [facility]  06/01/2018  9:05 AM [facility] [place] 2 [facility]  03/21/2019  3:00 PM Vicie Mutters, PA-C [facility] None    HPI 43 y.o.female presents for follow up for anxiety/palpitations.   Shew was started on celexa and pepcid last visit.  She states the pepcid is helping, she canceled the cardiology appointment since she is doing better. She states the metro cream has been helping. She started the celexa but then stopped it, she states she will start back on it for 1 week. She has a mouth piece for sleep apnea, not using it, has OV to try to get back on it.   She also had a rash she was treated with prednisone in Jan. She states the prednisone helped but she will feel tingling/burning in her hands when she first wakes up in the morning, will get a welp and then go away. Normally rash or welps will happen between her fingers.   Past Medical History:  Diagnosis Date  . HSV-2 infection   . Mild obstructive sleep apnea    study 07-12-2014 in epic,  mild osa w/ hypopnea AHI 13/hr--- 08-25-2017 per pt uses dental device  . Pelvic pain in female   . PONV (postoperative nausea and vomiting)      Allergies  Allergen Reactions  . Codeine Nausea And Vomiting  . Azithromycin Nausea And Vomiting  . Chlorhexidine Gluconate Rash    CHG    Current Outpatient Medications on File Prior to Visit  Medication Sig  . ALPRAZolam  (XANAX) 0.25 MG tablet Take 1 - 2 to 1 tablet 1 or 2 x /day if needed for Anxiety Attack  . citalopram (CELEXA) 20 MG tablet Take 1 tablet (20 mg total) by mouth daily.  . famotidine (PEPCID) 40 MG tablet Take 1 tablet (40 mg total) by mouth every evening.  Marland Kitchen ibuprofen (ADVIL,MOTRIN) 200 MG tablet Take 600 mg by mouth every 6 (six) hours as needed for headache or moderate pain.  . metoprolol succinate (TOPROL XL) 25 MG 24 hr tablet Take 1 tablet (25 mg total) by mouth at bedtime.  . metroNIDAZOLE (METROCREAM) 0.75 % cream Apply topically 2 (two) times daily.  . valACYclovir (VALTREX) 500 MG tablet Take 1 tablet (500 mg total) by mouth 2 (two) times daily. (Patient taking differently: Take 500 mg by mouth 2 (two) times daily as needed. Prn)   No current facility-administered medications on file prior to visit.     ROS: all negative except above.   Physical Exam: Filed Weights   05/29/18 0848  Weight: 174 lb 9.6 oz (79.2 kg)   BP 130/76   Pulse 80   Temp 97.8 F (36.6 C)   Ht 5' 3.5" (1.613 m)   Wt 174 lb 9.6 oz (79.2 kg)   LMP 05/29/2018   SpO2 98%   BMI 30.44 kg/m  General Appearance: Well  nourished, in no apparent distress. Eyes: PERRLA, EOMs, conjunctiva no swelling or erythema Sinuses: No Frontal/maxillary tenderness ENT/Mouth: Ext aud canals clear, TMs without erythema, bulging. No erythema, swelling, or exudate on post pharynx.  Tonsils not swollen or erythematous. Hearing normal.  Neck: Supple, thyroid normal.  Respiratory: Respiratory effort normal, BS equal bilaterally without rales, rhonchi, wheezing or stridor.  Cardio: RRR with no MRGs. Brisk peripheral pulses without edema.  Abdomen: Soft, + BS.  Non tender, no guarding, rebound, hernias, masses. Lymphatics: Non tender without lymphadenopathy.  Musculoskeletal: Full ROM, 5/5 strength, normal gait.  Skin: Warm, dry without rashes, lesions, ecchymosis.  Neuro: Cranial nerves intact. Normal muscle tone, no cerebellar  symptoms. Sensation intact.  Psych: Awake and oriented X 3, normal affect, Insight and Judgment appropriate.     Vicie Mutters, PA-C 9:08 AM [facility]

## 2018-05-29 ENCOUNTER — Ambulatory Visit (INDEPENDENT_AMBULATORY_CARE_PROVIDER_SITE_OTHER): Payer: 59 | Admitting: Physician Assistant

## 2018-05-29 ENCOUNTER — Encounter: Payer: Self-pay | Admitting: Physician Assistant

## 2018-05-29 VITALS — BP 130/76 | HR 80 | Temp 97.8°F | Ht 63.5 in | Wt 174.6 lb

## 2018-05-29 DIAGNOSIS — G4730 Sleep apnea, unspecified: Secondary | ICD-10-CM

## 2018-05-29 DIAGNOSIS — G4710 Hypersomnia, unspecified: Secondary | ICD-10-CM | POA: Diagnosis not present

## 2018-05-29 DIAGNOSIS — F419 Anxiety disorder, unspecified: Secondary | ICD-10-CM

## 2018-05-29 DIAGNOSIS — R21 Rash and other nonspecific skin eruption: Secondary | ICD-10-CM

## 2018-05-29 DIAGNOSIS — G5603 Carpal tunnel syndrome, bilateral upper limbs: Secondary | ICD-10-CM

## 2018-05-29 NOTE — Patient Instructions (Addendum)
Can do a steroid nasal spary 1-2 sparys at night each nostril.  Remember to spray each nostril twice towards the outer part of your eye.   Do not sniff but instead pinch your nose and tilt your head back to help the medicine get into your sinuses.   The best time to do this is at bedtime.  Stop if you get blurred vision or nose bleeds.   THIS WILL TAKE 7 DAYS TO WORK AND IS BETTER IF YOU START BEFORE SYMPTOMS SO IF YOU HAVE A SEASON OR TIME OF THE YEAR YOU ALWAYS GET A COLD, START BEFORE THAT!  GET ON ZYRTEC AT NIGHT TO TRY TO HELP DECREASE HIVES   Roll up towel and use as cervical neck pillow  Do the carpal tunnel brace from a pharmacy at night for 4-6 weeks, if this does not help we can refer to ortho.  Carpal Tunnel Syndrome  Carpal tunnel syndrome is a condition that causes pain in your hand and arm. The carpal tunnel is a narrow area located on the palm side of your wrist. Repeated wrist motion or certain diseases may cause swelling within the tunnel. This swelling pinches the main nerve in the wrist (median nerve). What are the causes? This condition may be caused by:  Repeated wrist motions.  Wrist injuries.  Arthritis.  A cyst or tumor in the carpal tunnel.  Fluid buildup during pregnancy.  Sometimes the cause of this condition is not known. What increases the risk? This condition is more likely to develop in:  People who have jobs that cause them to repeatedly move their wrists in the same motion, such as Art gallery manager.  Women.  People with certain conditions, such as: ? Diabetes. ? Obesity. ? An underactive thyroid (hypothyroidism). ? Kidney failure.  What are the signs or symptoms? Symptoms of this condition include:  A tingling feeling in your fingers, especially in your thumb, index, and middle fingers.  Tingling or numbness in your hand.  An aching feeling in your entire arm, especially when your wrist and elbow are bent for long periods of  time.  Wrist pain that goes up your arm to your shoulder.  Pain that goes down into your palm or fingers.  A weak feeling in your hands. You may have trouble grabbing and holding items.  Your symptoms may feel worse during the night. How is this diagnosed? This condition is diagnosed with a medical history and physical exam. You may also have tests, including:  An electromyogram (EMG). This test measures electrical signals sent by your nerves into the muscles.  X-rays.  How is this treated? Treatment for this condition includes:  Lifestyle changes. It is important to stop doing or modify the activity that caused your condition.  Physical or occupational therapy.  Medicines for pain and inflammation. This may include medicine that is injected into your wrist.  A wrist splint.  Surgery.  Follow these instructions at home: If you have a splint:  Wear it as told by your health care provider. Remove it only as told by your health care provider.  Loosen the splint if your fingers become numb and tingle, or if they turn cold and blue.  Keep the splint clean and dry. General instructions  Take over-the-counter and prescription medicines only as told by your health care provider.  Rest your wrist from any activity that may be causing your pain. If your condition is work related, talk to your employer about changes that  can be made, such as getting a wrist pad to use while typing.  If directed, apply ice to the painful area: ? Put ice in a plastic bag. ? Place a towel between your skin and the bag. ? Leave the ice on for 20 minutes, 2-3 times per day.  Keep all follow-up visits as told by your health care provider. This is important.  Do any exercises as told by your health care provider, physical therapist, or occupational therapist. Contact a health care provider if:  You have new symptoms.  Your pain is not controlled with medicines.  Your symptoms get worse. This  information is not intended to replace advice given to you by your health care provider. Make sure you discuss any questions you have with your health care provider. Document Released: 03/25/2000 Document Revised: 08/06/2015 Document Reviewed: 08/13/2014 Elsevier Interactive Patient Education  2018 [place] Eczema Dyshidrotic eczema (pompholyx) is a type of eczema that causes very itchy (pruritic), fluid-filled blisters (vesicles) to form on the hands and feet. It can affect people of any age, but is more common before the age of 8. There is no cure, but treatment and certain lifestyle changes can help relieve symptoms. What are the causes? The cause of this condition is not known. What increases the risk? You are more likely to develop this condition if:  You wash your hands frequently.  You have a personal history or family history of eczema, allergies, asthma, or hay fever.  You are allergic to metals such as nickel or cobalt.  You work with cement.  You smoke. What are the signs or symptoms? Symptoms of this condition may affect the hands, feet, or both. Symptoms may come and go (recur), and may include:  Severe itching, which may happen before blisters appear.  Blisters. These may form suddenly. ? Hives on the hands or fingers ? In severe cases, blisters may grow to large blister masses (bullae). ? Blisters resolve in 2-3 weeks without bursting. This is followed by a dry phase in which itching eases.  Pain and swelling.  Cracks or long, narrow openings (fissures) in the skin.  Ridges on the nails. How is this diagnosed? This condition may be diagnosed based on:  A physical exam.  Your symptoms.  Your medical history.  Skin scrapings to rule out a fungal infection.  Testing a swab of fluid for bacteria (culture).  Removing and checking a small piece of skin (biopsy) in order to test for infection or to rule out other conditions.  Skin patch  tests. These tests involve taking patches that contain possible allergens and placing them on your back. Your health care provider will wait a few days and then check to see if an allergic reaction occurred. These tests may be done if your health care provider suspects allergic reactions, or to rule out other types of eczema. You may be referred to a health care provider who specializes in the skin (dermatologist) to help diagnose and treat this condition. How is this treated? There is no cure for this condition, but treatment can help relieve symptoms. Depending on how many blisters you have and how severe they are, your health care provider may suggest:  Avoiding allergens, irritants, or triggers that worsen symptoms. This may involve lifestyle changes such as: ? Using different lotions or soaps. ? Avoiding hot weather or places that will cause you to sweat a lot. ? Managing stress with coping techniques such as relaxation and exercise,  and asking for help when you need it. ? Diet changes as recommended by your health care provider.  Using a clean, damp towel (cool compress) to relieve symptoms.  Soaking in a bath that contains a type of salt that relieves irritation (aluminum acetate soaks).  Medicine taken by mouth to reduce itching (oral antihistamines).  Medicine applied to the skin to reduce swelling and irritation (topical corticosteroids).  Medicine that reduces the activity of the body's disease-fighting system (immunosuppressants) to treat inflammation. This may be given in severe cases.  Antibiotic medicines to treat bacterial infection.  Light therapy (phototherapy). This involves shining ultraviolet (UV) light on affected skin in order to reduce itchiness and inflammation. Follow these instructions at home: Bathing and skin care   Wash skin gently. After bathing or washing your hands, pat your skin dry. Avoid rubbing your skin.  Remove all jewelry before bathing. If the  skin under the jewelry stays wet, blisters may form or get worse.  Apply cool compresses as told by your health care provider: ? Soak a clean towel in cool water. ? Wring out excess water until towel is damp. ? Place the towel over affected skin. Leave the towel on for 20 minutes at a time, 2-3 times a day.  Use mild soaps, cleansers, and lotions that do not contain dyes, perfumes, or other irritants.  Keep your skin hydrated. To do this: ? Avoid very hot water. Take lukewarm baths or showers. ? Apply moisturizer within three minutes of bathing. This locks in moisture. Medicines  Take and apply over-the-counter and prescription medicines only as told by your health care provider.  If you were prescribed antibiotic medicine, take or apply it as told by your health care provider. Do not stop using the antibiotic even if you start to feel better. General instructions  Identify and avoid triggers and allergens.  Keep fingernails short to avoid breaking open the skin while scratching.  Use waterproof gloves to protect your hands when doing work that keeps your hands wet for a long time.  Wear socks to keep your feet dry.  Do not use any products that contain nicotine or tobacco, such as cigarettes and e-cigarettes. If you need help quitting, ask your health care provider.  Keep all follow-up visits as told by your health care provider. This is important. Contact a health care provider if:  You have symptoms that do not go away.  You have signs of infection, such as: ? Crusting, pus, or a bad smell. ? More redness, swelling, or pain. ? Increased warmth in the affected area. Summary  Dyshidrotic eczema (pompholyx) is a type of eczema that causes very itchy (pruritic), fluid-filled blisters (vesicles) to form on the hands and feet.  The cause of this condition is not known.  There is no cure for this condition, but treatment can help relieve symptoms. Treatment depends on how many  blisters you have and how severe they are.  Use mild soaps, cleansers, and lotions that do not contain dyes, perfumes, or other irritants. Keep your skin hydrated. This information is not intended to replace advice given to you by your health care provider. Make sure you discuss any questions you have with your health care provider. Document Released: 08/11/2016 Document Revised: 08/11/2016 Document Reviewed: 08/11/2016 Elsevier Interactive Patient Education  2019 [employer].

## 2018-06-01 ENCOUNTER — Ambulatory Visit: Payer: 59

## 2018-06-01 ENCOUNTER — Other Ambulatory Visit: Payer: 59

## 2018-06-01 ENCOUNTER — Ambulatory Visit
Admission: RE | Admit: 2018-06-01 | Discharge: 2018-06-01 | Disposition: A | Discharge status: Home / Self Care | Payer: 59 | Source: Ambulatory Visit | Attending: Physician Assistant | Admitting: Physician Assistant

## 2018-06-01 DIAGNOSIS — R928 Other abnormal and inconclusive findings on diagnostic imaging of breast: Secondary | ICD-10-CM

## 2018-07-23 ENCOUNTER — Other Ambulatory Visit: Payer: Self-pay | Admitting: Physician Assistant

## 2018-07-23 DIAGNOSIS — F419 Anxiety disorder, unspecified: Secondary | ICD-10-CM

## 2018-07-23 DIAGNOSIS — R0989 Other specified symptoms and signs involving the circulatory and respiratory systems: Secondary | ICD-10-CM

## 2018-09-04 NOTE — Progress Notes (Deleted)
FOLLOW UP  Assessment and Plan:   Labile hypertension - start new medication, DASH diet, exercise and monitor at home. Call if greater than 130/80.  -     metoprolol succinate (TOPROL XL) 25 MG 24 hr tablet; Take 1 tablet (25 mg total) by mouth at bedtime. -     CBC with Differential/Platelet -     COMPLETE METABOLIC PANEL WITH GFR -     TSH  Mixed hyperlipidemia check lipids decrease fatty foods increase activity.  -     Lipid panel  Anxiety Continue counseling, limit xanax -     metoprolol succinate (TOPROL XL) 25 MG 24 hr tablet; Take 1 tablet (25 mg total) by mouth at bedtime.  Sleep apnea with hypersomnolence continue mouth piece  Obesity (BMI 30.0-34.9) - follow up 3 months for progress monitoring - increase veggies, decrease carbs - long discussion about weight loss, diet, and exercise   Continue diet and meds as discussed. Further disposition pending results of labs. Over 30 minutes of exam, counseling, chart review, and critical decision making was performed  Future Appointments  Date Time Provider [place]  09/05/2018  8:45 AM Vicie Mutters, PA-C [facility] None  03/21/2019  3:00 PM Vicie Mutters, PA-C [facility] None     HPI 43 y.o. female  presents for 3 month follow up on hypertension, cholesterol, prediabetes, and vitamin D deficiency.   Her blood pressure has not been controlled at home, has checked it several times at home and at appointments with it elevated, today their BP is   . She is trying to get mouth piece for her sleep apnea, now wearing it more. She has anxiety, she will take xanax 1/2 a few times a week.  BP Readings from Last 3 Encounters:  05/29/18 130/76  05/04/18 130/80  03/19/18 126/72   BMI is There is no height or weight on file to calculate BMI., she is working on diet and exercise. Wt Readings from Last 3 Encounters:  05/29/18 174 lb 9.6 oz (79.2 kg)  05/04/18 175 lb (79.4 kg)  03/19/18 173 lb 12.8 oz (78.8 kg)    She does not workout. She denies chest pain, shortness of breath, dizziness.   She  is not  on cholesterol medication and denies myalgias. Her cholesterol is at goal. The cholesterol last visit was:   Lab Results  Component Value Date   CHOL 154 03/19/2018   HDL 45 (L) 03/19/2018   LDLCALC 81 03/19/2018   TRIG 180 (H) 03/19/2018   CHOLHDL 3.4 03/19/2018   Last A1C in the office was:  Lab Results  Component Value Date   HGBA1C 5.1 01/27/2016    Patient is on Vitamin D supplement.   Lab Results  Component Value Date   VD25OH 16 (L) 03/19/2018        Current Medications:  Current Outpatient Medications on File Prior to Visit  Medication Sig  . ALPRAZolam (XANAX) 0.25 MG tablet Take 1 - 2 to 1 tablet 1 or 2 x /day if needed for Anxiety Attack  . citalopram (CELEXA) 20 MG tablet Take 1 tablet (20 mg total) by mouth daily.  . famotidine (PEPCID) 40 MG tablet Take 1 tablet (40 mg total) by mouth every evening.  Marland Kitchen ibuprofen (ADVIL,MOTRIN) 200 MG tablet Take 600 mg by mouth every 6 (six) hours as needed for headache or moderate pain.  . metoprolol succinate (TOPROL-XL) 25 MG 24 hr tablet TAKE 1 TABLET BY MOUTH EVERYDAY AT BEDTIME  . metroNIDAZOLE (  METROCREAM) 0.75 % cream Apply topically 2 (two) times daily.  . valACYclovir (VALTREX) 500 MG tablet Take 1 tablet (500 mg total) by mouth 2 (two) times daily. (Patient taking differently: Take 500 mg by mouth 2 (two) times daily as needed. Prn)   No current facility-administered medications on file prior to visit.     Medical History:  Past Medical History:  Diagnosis Date  . HSV-2 infection   . Mild obstructive sleep apnea    study 07-12-2014 in epic,  mild osa w/ hypopnea AHI 13/hr--- 08-25-2017 per pt uses dental device  . Pelvic pain in female   . PONV (postoperative nausea and vomiting)    Allergies:  Allergies  Allergen Reactions  . Codeine Nausea And Vomiting  . Azithromycin Nausea And Vomiting  . Chlorhexidine Gluconate  Rash    CHG     Review of Systems:  Review of Systems  Constitutional: Negative.  Negative for chills, fever and malaise/fatigue.  HENT: Negative.  Negative for tinnitus.   Eyes: Negative.  Negative for blurred vision and double vision.  Respiratory: Negative.  Negative for shortness of breath.   Cardiovascular: Negative.  Negative for chest pain.  Gastrointestinal: Negative.   Genitourinary: Negative.   Musculoskeletal: Negative.   Skin: Negative.   Neurological: Negative.  Negative for headaches.  Psychiatric/Behavioral: Negative.      Family history- Review and unchanged Social history- Review and unchanged Physical Exam: There were no vitals taken for this visit. Wt Readings from Last 3 Encounters:  05/29/18 174 lb 9.6 oz (79.2 kg)  05/04/18 175 lb (79.4 kg)  03/19/18 173 lb 12.8 oz (78.8 kg)   General Appearance: Well nourished, in no apparent distress. Eyes: PERRLA, EOMs, conjunctiva no swelling or erythema Sinuses: No Frontal/maxillary tenderness ENT/Mouth: Ext aud canals clear, TMs without erythema, bulging. No erythema, swelling, or exudate on post pharynx.  Tonsils not swollen or erythematous. Hearing normal.  Neck: Supple, thyroid normal.  Respiratory: Respiratory effort normal, BS equal bilaterally without rales, rhonchi, wheezing or stridor.  Cardio: RRR with no MRGs. Brisk peripheral pulses without edema.  Abdomen: Soft, + BS,  Non tender, no guarding, rebound, hernias, masses. Lymphatics: Non tender without lymphadenopathy.  Musculoskeletal: Full ROM, 5/5 strength, Normal gait Skin: Warm, dry without rashes, lesions, ecchymosis.  Neuro: Cranial nerves intact. Normal muscle tone, no cerebellar symptoms. Psych: Awake and oriented X 3, normal affect, Insight and Judgment appropriate.    Vicie Mutters, PA-C 9:35 AM [facility]

## 2018-09-05 ENCOUNTER — Ambulatory Visit: Payer: Self-pay | Admitting: Physician Assistant

## 2018-12-26 ENCOUNTER — Ambulatory Visit (INDEPENDENT_AMBULATORY_CARE_PROVIDER_SITE_OTHER): Payer: 59 | Admitting: Internal Medicine

## 2018-12-26 ENCOUNTER — Other Ambulatory Visit: Payer: Self-pay

## 2018-12-26 VITALS — BP 144/90 | HR 72 | Temp 97.2°F | Resp 16 | Ht 63.5 in | Wt 172.8 lb

## 2018-12-26 DIAGNOSIS — R42 Dizziness and giddiness: Secondary | ICD-10-CM | POA: Diagnosis not present

## 2018-12-26 DIAGNOSIS — R0989 Other specified symptoms and signs involving the circulatory and respiratory systems: Secondary | ICD-10-CM | POA: Diagnosis not present

## 2018-12-26 MED ORDER — MECLIZINE HCL 25 MG PO TABS: ORAL_TABLET | ORAL | 0 refills | Status: DC

## 2018-12-26 MED ORDER — METOPROLOL SUCCINATE ER 100 MG PO TB24: ORAL_TABLET | ORAL | 1 refills | Status: DC

## 2018-12-26 NOTE — Progress Notes (Signed)
Subjective:    Patient ID: Emily Nolan, female    DOB: 04/04/1976, 43 y.o.   MRN: [id number]  HPIPatient is a very nice 43 yo lady with hx/i presenting with 2 day hx/o postural & positional dizziness She has been on low dose Metoprolol for  Anxiety and HA prevention. Has occasional fronto-occipital dull HA's, No aura, N/V  Medication Sig  . ALPRAZolam (XANAX) 0.25 MG tablet Take 1 - 2 to 1 tablet 1 or 2 x /day if needed for Anxiety Attack  . famotidine (PEPCID) 40 MG tablet Take 1 tablet (40 mg total) by mouth every evening.  Marland Kitchen ibuprofen (ADVIL,MOTRIN) 200 MG tablet Take 600 mg by mouth every 6 (six) hours as needed for headache or moderate pain.  . metroNIDAZOLE (METROCREAM) 0.75 % cream Apply topically 2 (two) times daily.  . valACYclovir (VALTREX) 500 MG tablet Take 1 tablet (500 mg total) by mouth 2 (two) times daily. (Patient taking differently: Take 500 mg by mouth 2 (two) times daily as needed. Prn)  . metoprolol succinate (TOPROL-XL) 25 MG 24 hr tablet TAKE 1 TABLET BY MOUTH EVERYDAY AT BEDTIME  .     (Patient not taking: Reported on 12/26/2018)   Allergies  Allergen Reactions  . Codeine Nausea And Vomiting  . Azithromycin Nausea And Vomiting  . Chlorhexidine Gluconate Rash    CHG   Past Medical History:  Diagnosis Date  . HSV-2 infection   . Mild obstructive sleep apnea    study 07-12-2014 in epic,  mild osa w/ hypopnea AHI 13/hr--- 08-25-2017 per pt uses dental device  . Pelvic pain in female   . PONV (postoperative nausea and vomiting)    Past Surgical History:  Procedure Laterality Date  . DILATION AND CURETTAGE OF UTERUS  age 54   w/ suction for miscarriage  . ENDOMETRIAL ABLATION N/A 09/01/2017   Procedure: ENDOMETRIAL ABLATION;  Surgeon: Cheri Fowler, MD;  Location: [facility];  Service: Gynecology;  Laterality: N/A;  USING MINERVA  . ESSURE TUBAL LIGATION Bilateral Jan or Feb 2012   dr meisinger office  . LAPAROSCOPIC BILATERAL  SALPINGECTOMY Bilateral 09/01/2017   Procedure: LAPAROSCOPIC BILATERAL SALPINGECTOMY;  Surgeon: Cheri Fowler, MD;  Location: [facility];  Service: Gynecology;  Laterality: Bilateral;  DR request RNFA  . MASS EXCISION Left 01/14/2016   Procedure: EXCISION OF LEFT BREAST MASS;  Surgeon: Stark Klein, MD;  Location: [facility];  Service: General;  Laterality: Left;   Review of Systems    10 point systems review negative except as above.    Objective:   Physical Exam  BP 144/90-sit and 146/92-stand  P72   T 97.2 F    R 16   Ht 5' 3.5"  Wt 172 lb 12.8 oz    BMI 30.13    ReCheck postural Sitting BP 168/110   P 81    &       Standing BP160/109       P 77  HEENT - WNL. TM's Normal .  Neck - supple.  Chest - Clear equal BS. Cor - Nl HS. RRR w/o sig MGR. PP 1(+). No edema. MS- FROM w/o deformities.  Gait Nl. Neuro -  Nl w/o focal abnormalities.    Assessment & Plan:   1. Labile hypertension  - metoprolol succinate (TOPROL XL) 100 MG 24 hr tablet; Take 1 tablet Daily for BP  Dispense: 90 tablet; Refill: 1  2. Vertigo  - meclizine  25 MG tablet; Take 1/2  to 1 tablet 2 to 3 x /day as needed for Dizziness / Vertigo  Dispense: 90 tablet - Discussed meds / SE's. Discussed ER precautions if developed   - ROV - 3 weeks to recheck BP

## 2018-12-26 NOTE — Patient Instructions (Signed)

## 2019-01-16 ENCOUNTER — Ambulatory Visit: Payer: 59 | Admitting: Internal Medicine

## 2019-01-17 NOTE — Progress Notes (Signed)
Assessment and Plan: Emily Nolan was seen today for follow-up.  Diagnoses and all orders for this visit:  Labile hypertension Increase metoprolol to 50mg  daily of the XL Monitor BP at home  Vertigo Resolved, normal neuro  Neck pain -     celecoxib (CELEBREX) 200 MG capsule; Take 1 capsule (200 mg total) by mouth daily. Stop taking NSAIDS Get better work enviroment Take flexeril at night  Nonintractable episodic headache, unspecified headache type -     Ubrogepant (UBRELVY) 50 MG TABS; Take 50 mg by mouth daily as needed (migraine). -     Ubrogepant (UBRELVY) 100 MG TABS; Take 100 mg by mouth daily as needed (for migraine). ? From neck or migraine, treat both  Gastroesophageal reflux disease without esophagitis -     omeprazole (PRILOSEC) 40 MG capsule; Take 1 capsule (40 mg total) by mouth daily. - avoid NSAIDS, will do 2 weeks of prilosec and then back to pepcid.      Future Appointments  Date Time Provider [place]  03/21/2019  3:00 PM Emily Mutters, PA-C [facility] None     HPI 43 y.o.female presents for follow up for hypertension and positional vertigo.  She woke up with vertigo and elevated BP.  Took meclizine for a few days and the vertigo lasted x 5 days and it is resolved. No allergy meds, no sudafed.  Takes NSAIDS, iburpofen 3 x a day for headaches and neck pain. Happens at end of the day and will occ wake up with them.   She started metoprolol 100mg  last visit 3 weeks ago. Has OSA, on mouth piece x June  BP Readings from Last 3 Encounters:  01/22/19 138/90  12/26/18 (!) 144/90  05/29/18 130/76   BMI is Body mass index is 30.34 kg/m., she is working on diet and exercise. Wt Readings from Last 3 Encounters:  01/22/19 174 lb (78.9 kg)  12/26/18 172 lb 12.8 oz (78.4 kg)  05/29/18 174 lb 9.6 oz (79.2 kg)     Patient Active Problem List   Diagnosis Date Noted  . Pelvic pain in female 09/01/2017  . Hyperlipidemia 06/06/2017  . Labile  hypertension 06/06/2017  . Obesity (BMI 30.0-34.9) 06/06/2017  . Anxiety 01/03/2017  . Menorrhagia 01/03/2017  . Sleep apnea with hypersomnolence 05/07/2015      Current Outpatient Medications (Cardiovascular):  .  metoprolol succinate (TOPROL XL) 100 MG 24 hr tablet, Take 1 tablet Daily for BP (Patient taking differently: Takes 25mg  daily)   Current Outpatient Medications (Analgesics):  .  ibuprofen (ADVIL,MOTRIN) 200 MG tablet, Take 600 mg by mouth every 6 (six) hours as needed for headache or moderate pain. .  celecoxib (CELEBREX) 200 MG capsule, Take 1 capsule (200 mg total) by mouth daily. Marland Kitchen  Ubrogepant (UBRELVY) 100 MG TABS, Take 100 mg by mouth daily as needed (for migraine). .  Ubrogepant (UBRELVY) 50 MG TABS, Take 50 mg by mouth daily as needed (migraine).   Current Outpatient Medications (Other):  Marland Kitchen  ALPRAZolam (XANAX) 0.25 MG tablet, Take 1 - 2 to 1 tablet 1 or 2 x /day if needed for Anxiety Attack .  famotidine (PEPCID) 40 MG tablet, Take 1 tablet (40 mg total) by mouth every evening. .  meclizine (ANTIVERT) 25 MG tablet, Take 1/2 to 1 tablet 2 to 3 x /day as needed for Dizziness / Vertigo .  metroNIDAZOLE (METROCREAM) 0.75 % cream, Apply topically 2 (two) times daily. .  valACYclovir (VALTREX) 500 MG tablet, Take 1 tablet (500 mg total) by  mouth 2 (two) times daily. (Patient taking differently: Take 500 mg by mouth 2 (two) times daily as needed. Prn) .  omeprazole (PRILOSEC) 40 MG capsule, Take 1 capsule (40 mg total) by mouth daily.  Allergies  Allergen Reactions  . Codeine Nausea And Vomiting  . Azithromycin Nausea And Vomiting  . Chlorhexidine Gluconate Rash    CHG    ROS: all negative except above.   Physical Exam: Filed Weights   01/22/19 1546  Weight: 174 lb (78.9 kg)   BP 138/90   Pulse 70   Temp 97.7 F (36.5 C)   Ht 5' 3.5" (1.613 m)   Wt 174 lb (78.9 kg)   SpO2 97%   BMI 30.34 kg/m  General Appearance: Well nourished, in no apparent  distress. Eyes: PERRLA, EOMs, conjunctiva no swelling or erythema Sinuses: No Frontal/maxillary tenderness ENT/Mouth: Ext aud canals clear, TMs without erythema, bulging. No erythema, swelling, or exudate on post pharynx.  Tonsils not swollen or erythematous. Hearing normal.  Neck: Supple, thyroid normal.  Respiratory: Respiratory effort normal, BS equal bilaterally without rales, rhonchi, wheezing or stridor.  Cardio: RRR with no MRGs. Brisk peripheral pulses without edema.  Abdomen: Soft, + BS.  Non tender, no guarding, rebound, hernias, masses. Lymphatics: Non tender without lymphadenopathy.  Musculoskeletal: Full ROM, 5/5 strength, normal gait.  Skin: Warm, dry without rashes, lesions, ecchymosis.  Neuro: Cranial nerves intact. Normal muscle tone, no cerebellar symptoms. Sensation intact.  Psych: Awake and oriented X 3, normal affect, Insight and Judgment appropriate.     Emily Mutters, PA-C 4:29 PM [facility]

## 2019-01-22 ENCOUNTER — Other Ambulatory Visit: Payer: Self-pay

## 2019-01-22 ENCOUNTER — Ambulatory Visit (INDEPENDENT_AMBULATORY_CARE_PROVIDER_SITE_OTHER): Payer: 59 | Admitting: Physician Assistant

## 2019-01-22 ENCOUNTER — Encounter: Payer: Self-pay | Admitting: Physician Assistant

## 2019-01-22 VITALS — BP 138/90 | HR 70 | Temp 97.7°F | Ht 63.5 in | Wt 174.0 lb

## 2019-01-22 DIAGNOSIS — M542 Cervicalgia: Secondary | ICD-10-CM | POA: Diagnosis not present

## 2019-01-22 DIAGNOSIS — R519 Headache, unspecified: Secondary | ICD-10-CM

## 2019-01-22 DIAGNOSIS — R42 Dizziness and giddiness: Secondary | ICD-10-CM

## 2019-01-22 DIAGNOSIS — R0989 Other specified symptoms and signs involving the circulatory and respiratory systems: Secondary | ICD-10-CM

## 2019-01-22 DIAGNOSIS — K219 Gastro-esophageal reflux disease without esophagitis: Secondary | ICD-10-CM

## 2019-01-22 MED ORDER — OMEPRAZOLE 40 MG PO CPDR: 40.0000 mg | DELAYED_RELEASE_CAPSULE | Freq: Every day | ORAL | 0 refills | Status: DC

## 2019-01-22 MED ORDER — UBRELVY 100 MG PO TABS: 100.0000 mg | ORAL_TABLET | Freq: Every day | ORAL | 0 refills | Status: DC | PRN

## 2019-01-22 MED ORDER — UBRELVY 50 MG PO TABS: 50.0000 mg | ORAL_TABLET | Freq: Every day | ORAL | 0 refills | Status: DC | PRN

## 2019-01-22 MED ORDER — CELECOXIB 200 MG PO CAPS: 200.0000 mg | ORAL_CAPSULE | Freq: Every day | ORAL | 2 refills | Status: DC

## 2019-01-22 NOTE — Patient Instructions (Addendum)
Try the metoprolol 50mg , can increase to 2 pills a day and then cut back on the 100 mg in half to make 50 mg - this may help decrease headaches and help with anxiety/blood pressure.  Will do flexeril at night to try to prevent headache.   Try the new medication for a headache Ubrevly- should help within 2 hours of headache Take the celebrex as needed if the ubrevly does not help  Take the prilosec 40mg  for 2 weeks to heal your stomach  Fix how you are looking at your screen at work  Get eyes checked.   Check out omron BP  HYPERTENSION INFORMATION  Monitor your blood pressure at home, please keep a record and bring that in with you to your next office visit.   Go to the ER if any CP, SOB, nausea, dizziness, severe HA, changes vision/speech  Testing/Procedures: HOW TO TAKE YOUR BLOOD PRESSURE:  Rest 5 minutes before taking your blood pressure.  Don't smoke or drink caffeinated beverages for at least 30 minutes before.  Take your blood pressure before (not after) you eat.  Sit comfortably with your back supported and both feet on the floor (don't cross your legs).  Elevate your arm to heart level on a table or a desk.  Use the proper sized cuff. It should fit smoothly and snugly around your bare upper arm. There should be enough room to slip a fingertip under the cuff. The bottom edge of the cuff should be 1 inch above the crease of the elbow.  Due to a recent study, SPRINT, we have changed our goal for the systolic or top blood pressure number. Ideally we want your top number at 120.  In the [place] Children'S Trial, 5000 people were randomized to a goal BP of 120 and 5000 people were randomized to a goal BP of less than 140. The patients with the goal BP at 120 had LESS DEMENTIA, LESS HEART ATTACKS, AND LESS STROKES, AS WELL AS OVERALL DECREASED MORTALITY OR DEATH RATE.   There was another study that showed taking your blood pressure medications at night decrease cardiovascular events.   However if you are on a fluid pill, please take this in the morning.   If you are willing, our goal BP is the top number of 120.  Your most recent BP: BP: 138/90   Take your medications faithfully as instructed. Maintain a healthy weight. Get at least 150 minutes of aerobic exercise per week. Minimize salt intake. Minimize alcohol intake  DASH Eating Plan DASH stands for "Dietary Approaches to Stop Hypertension." The DASH eating plan is a healthy eating plan that has been shown to reduce high blood pressure (hypertension). Additional health benefits may include reducing the risk of type 2 diabetes mellitus, heart disease, and stroke. The DASH eating plan may also help with weight loss. WHAT DO I NEED TO KNOW ABOUT THE DASH EATING PLAN? For the DASH eating plan, you will follow these general guidelines:  Choose foods with a percent daily value for sodium of less than 5% (as listed on the food label).  Use salt-free seasonings or herbs instead of table salt or sea salt.  Check with your health care provider or pharmacist before using salt substitutes.  Eat lower-sodium products, often labeled as "lower sodium" or "no salt added."  Eat fresh foods.  Eat more vegetables, fruits, and low-fat dairy products.  Choose whole grains. Look for the word "whole" as the first word in the ingredient list.  Choose fish and skinless chicken or [place] more often than red meat. Limit fish, poultry, and meat to 6 oz (170 g) each day.  Limit sweets, desserts, sugars, and sugary drinks.  Choose heart-healthy fats.  Limit cheese to 1 oz (28 g) per day.  Eat more home-cooked food and less restaurant, buffet, and fast food.  Limit fried foods.  Cook foods using methods other than frying.  Limit canned vegetables. If you do use them, rinse them well to decrease the sodium.  When eating at a restaurant, ask that your food be prepared with less salt, or no salt if possible. WHAT FOODS CAN I  EAT? Seek help from a dietitian for individual calorie needs. Grains Whole grain or whole wheat bread. Brown rice. Whole grain or whole wheat pasta. Quinoa, bulgur, and whole grain cereals. Low-sodium cereals. Corn or whole wheat flour tortillas. Whole grain cornbread. Whole grain crackers. Low-sodium crackers. Vegetables Fresh or frozen vegetables (raw, steamed, roasted, or grilled). Low-sodium or reduced-sodium tomato and vegetable juices. Low-sodium or reduced-sodium tomato sauce and paste. Low-sodium or reduced-sodium canned vegetables.  Fruits All fresh, canned (in natural juice), or frozen fruits. Meat and Other Protein Products Ground beef (85% or leaner), grass-fed beef, or beef trimmed of fat. Skinless chicken or [place]. Ground chicken or [place]. Pork trimmed of fat. All fish and seafood. Eggs. Dried beans, peas, or lentils. Unsalted nuts and seeds. Unsalted canned beans. Dairy Low-fat dairy products, such as skim or 1% milk, 2% or reduced-fat cheeses, low-fat ricotta or cottage cheese, or plain low-fat yogurt. Low-sodium or reduced-sodium cheeses. Fats and Oils Tub margarines without trans fats. Light or reduced-fat mayonnaise and salad dressings (reduced sodium). Avocado. Safflower, olive, or canola oils. Natural peanut or almond butter. Other Unsalted popcorn and pretzels. The items listed above may not be a complete list of recommended foods or beverages. Contact your dietitian for more options. WHAT FOODS ARE NOT RECOMMENDED? Grains White bread. White pasta. White rice. Refined cornbread. Bagels and croissants. Crackers that contain trans fat. Vegetables Creamed or fried vegetables. Vegetables in a cheese sauce. Regular canned vegetables. Regular canned tomato sauce and paste. Regular tomato and vegetable juices. Fruits Dried fruits. Canned fruit in light or heavy syrup. Fruit juice. Meat and Other Protein Products Fatty cuts of meat. Ribs, chicken wings, bacon, sausage,  bologna, salami, chitterlings, fatback, hot dogs, bratwurst, and packaged luncheon meats. Salted nuts and seeds. Canned beans with salt. Dairy Whole or 2% milk, cream, half-and-half, and cream cheese. Whole-fat or sweetened yogurt. Full-fat cheeses or blue cheese. Nondairy creamers and whipped toppings. Processed cheese, cheese spreads, or cheese curds. Condiments Onion and garlic salt, seasoned salt, table salt, and sea salt. Canned and packaged gravies. Worcestershire sauce. Tartar sauce. Barbecue sauce. Teriyaki sauce. Soy sauce, including reduced sodium. Steak sauce. Fish sauce. Oyster sauce. Cocktail sauce. Horseradish. Ketchup and mustard. Meat flavorings and tenderizers. Bouillon cubes. Hot sauce. Tabasco sauce. Marinades. Taco seasonings. Relishes. Fats and Oils Butter, stick margarine, lard, shortening, ghee, and bacon fat. Coconut, palm kernel, or palm oils. Regular salad dressings. Other Pickles and olives. Salted popcorn and pretzels. The items listed above may not be a complete list of foods and beverages to avoid. Contact your dietitian for more information. WHERE CAN I FIND MORE INFORMATION? National Heart, Lung, and Blood Institute: travelstabloid.com Document Released: 03/17/2011 Document Revised: 08/12/2013 Document Reviewed: 01/30/2013 [employer] Patient Information 2015 [place], [place]. This information is not intended to replace advice given to you by your health care provider. Make sure  you discuss any questions you have with your health care provider.  Take omeprazole  for 2 weeks, then go to  pepcid 20 or 40mg  at night for 2 weeks  Avoid alcohol, spicy foods, NSAIDS (aleve, ibuprofen) at this time.  See foods below.   Food Choices for Gastroesophageal Reflux Disease When you have gastroesophageal reflux disease (GERD), the foods you eat and your eating habits are very important. Choosing the right foods can help ease the discomfort of  GERD. WHAT GENERAL GUIDELINES DO I NEED TO FOLLOW?  Choose fruits, vegetables, whole grains, low-fat dairy products, and low-fat meat, fish, and poultry.  Limit fats such as oils, salad dressings, butter, nuts, and avocado.  Keep a food diary to identify foods that cause symptoms.  Avoid foods that cause reflux. These may be different for different people.  Eat frequent small meals instead of three large meals each day.  Eat your meals slowly, in a relaxed setting.  Limit fried foods.  Cook foods using methods other than frying.  Avoid drinking alcohol.  Avoid drinking large amounts of liquids with your meals.  Avoid bending over or lying down until 2-3 hours after eating. WHAT FOODS ARE NOT RECOMMENDED? The following are some foods and drinks that may worsen your symptoms: Vegetables Tomatoes. Tomato juice. Tomato and spaghetti sauce. Chili peppers. Onion and garlic. Horseradish. Fruits Oranges, grapefruit, and lemon (fruit and juice). Meats High-fat meats, fish, and poultry. This includes hot dogs, ribs, ham, sausage, salami, and bacon. Dairy Whole milk and chocolate milk. Sour cream. Cream. Butter. Ice cream. Cream cheese.  Beverages Coffee and tea, with or without caffeine. Carbonated beverages or energy drinks. Condiments Hot sauce. Barbecue sauce.  Sweets/Desserts Chocolate and cocoa. Donuts. Peppermint and spearmint. Fats and Oils High-fat foods, including [place] fries and potato chips. Other Vinegar. Strong spices, such as black pepper, white pepper, red pepper, cayenne, curry powder, cloves, ginger, and chili powder.

## 2019-02-12 ENCOUNTER — Other Ambulatory Visit: Payer: Self-pay

## 2019-02-12 ENCOUNTER — Encounter: Payer: Self-pay | Admitting: Adult Health

## 2019-02-12 ENCOUNTER — Ambulatory Visit (INDEPENDENT_AMBULATORY_CARE_PROVIDER_SITE_OTHER): Payer: 59 | Admitting: Adult Health

## 2019-02-12 VITALS — BP 128/82 | HR 71 | Temp 97.7°F | Ht 63.5 in | Wt 173.6 lb

## 2019-02-12 DIAGNOSIS — R109 Unspecified abdominal pain: Secondary | ICD-10-CM

## 2019-02-12 DIAGNOSIS — R10814 Left lower quadrant abdominal tenderness: Secondary | ICD-10-CM | POA: Diagnosis not present

## 2019-02-12 DIAGNOSIS — R1032 Left lower quadrant pain: Secondary | ICD-10-CM | POA: Diagnosis not present

## 2019-02-12 NOTE — Progress Notes (Signed)
Assessment and Plan:  Skylene was seen today for referral.  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 The patient voices understanding of current treatment options and is in agreement with the current care plan.The patient knows to call the clinic with any problems, questions or concerns or go to the ER if any further progression of symptoms.  -     CBC with Differential/Platelet -     COMPLETE METABOLIC PANEL WITH GFR -     Urinalysis, Routine w reflex microscopic -     CT Abdomen Pelvis Wo Contrast; Future  Further disposition pending results of labs. Discussed med's effects and SE's.   Over 20 minutes of exam, counseling, chart review, and critical decision making was performed.   Future Appointments  Date Time Provider [place]  02/14/2019  8:30 AM [facility] 1 [facility]  03/21/2019  3:00 PM Vicie Mutters, PA-C [facility] None    ------------------------------------------------------------------------------------------------------------------   HPI BP 128/82   Pulse 71   Temp 97.7 F (36.5 C)   Ht 5' 3.5" (1.613 m)   Wt 173 lb 9.6 oz  (78.7 kg)   SpO2 98%   BMI 30.27 kg/m   42 y.o.female with hx of anxiety, obesity, menorrhagia, pelvic pain presents for evaluation of L sided abdominal pain/pelvic pain. She is wondering if this might be r/t a kidney stone and questioning whether she needs a urology referral.   She reports several week history of L sided upper abdominal pain; PA suggested she reduce NSAID use and add omeprazole 40 mg daily which she did without notable change. She reports had normal exam by GYN Dr. Oda Kilts on 02/01/2019 but was advised "crystals" in urine and to discuss possible urology evaluation with PCP. On review, she has had moderate calcium oxalate crystals in urine.   She reports 4 days ago she had acute severe episode of L sided abdominal and back pain, pelvic pressure, severe, with nausea and sweating, constant for 2 hours then gradually improved, but continues to experience LLQ/pelvic pressure/tenderness and fatigue over the last several days. Denies hematuria, urine character changes, vaginal discharge, constipation/diarrhea. She does report gassiness and "it hurt to even pass gas."   She reports has seen urology remotely due to chronic hematuria, underwent ? In office Cystoscopy which was reportedly negative and was advised to follow up only if needed.   Last year she had lap salpingectomy bilaterally by Dr. Oda Kilts due to chronic pain with remote insertion of coils and reports improved chronic pelvic pain.   She has not had colonoscopy; no family history of colon cancer.   Past Medical History:  Diagnosis Date  . HSV-2 infection   . Mild obstructive sleep apnea    study 07-12-2014 in epic,  mild osa w/ hypopnea AHI 13/hr--- 08-25-2017 per pt uses dental device  . Pelvic pain in female   . PONV (postoperative nausea and vomiting)      Allergies  Allergen Reactions  . Codeine Nausea And Vomiting  . Azithromycin Nausea And Vomiting  . Chlorhexidine Gluconate Rash    CHG    Current  Outpatient Medications on File Prior to Visit  Medication Sig  . ALPRAZolam (XANAX) 0.25 MG tablet Take 1 - 2 to 1 tablet 1 or 2 x /day if needed for Anxiety Attack  . celecoxib (CELEBREX) 200 MG capsule Take 1 capsule (200 mg total) by mouth daily.  . famotidine (PEPCID) 40 MG tablet Take 1 tablet (40 mg total) by mouth every evening.  Marland Kitchen ibuprofen (ADVIL,MOTRIN) 200 MG tablet Take 600 mg by mouth every 6 (six) hours as needed for headache or moderate pain.  . meclizine (ANTIVERT) 25 MG tablet Take 1/2 to 1 tablet 2 to 3 x /day as needed for Dizziness / Vertigo  . metoprolol succinate (TOPROL XL) 100 MG 24 hr tablet Take 1 tablet Daily for BP (Patient taking differently: Takes 25mg  daily)  . metroNIDAZOLE (METROCREAM) 0.75 % cream Apply topically 2 (two) times daily.  Marland Kitchen omeprazole (PRILOSEC) 40 MG capsule Take 1 capsule (40 mg total) by mouth daily.  Marland Kitchen Ubrogepant (UBRELVY) 100 MG TABS Take 100 mg by mouth daily as needed (for migraine).  . Ubrogepant (UBRELVY) 50 MG TABS Take 50 mg by mouth daily as needed (migraine).  . valACYclovir (VALTREX) 500 MG tablet Take 1 tablet (500 mg total) by mouth 2 (two) times daily. (Patient taking differently: Take 500 mg by mouth 2 (two) times daily as needed. Prn)   No current facility-administered medications on file prior to visit.     ROS: Review of Systems  Constitutional: Positive for malaise/fatigue. Negative for chills, diaphoresis, fever and weight loss.  HENT: Negative.  Negative for congestion, sinus pain and sore throat.   Eyes: Negative.  Negative for blurred vision and double vision.  Respiratory: Negative.  Negative for cough, sputum production, shortness of breath and wheezing.   Cardiovascular: Negative.  Negative for chest pain, palpitations and leg swelling.  Gastrointestinal: Positive for abdominal pain. Negative for blood in stool, constipation, diarrhea, heartburn, melena, nausea and vomiting.  Genitourinary: Negative for dysuria, flank  pain (L ), frequency, hematuria and urgency.  Musculoskeletal: Positive for back pain. Negative for myalgias.  Skin: Negative for rash.  Neurological: Negative for dizziness and headaches.  Endo/Heme/Allergies: Negative for polydipsia.  Psychiatric/Behavioral: Negative for substance abuse.     Physical Exam:  BP 128/82   Pulse 71   Temp 97.7 F (36.5 C)   Ht 5' 3.5" (1.613 m)   Wt 173 lb 9.6 oz (78.7 kg)   SpO2 98%   BMI 30.27 kg/m   General Appearance: Well nourished, in no apparent distress. Eyes: PERRLA, conjunctiva no swelling or erythema ENT/Mouth:  No erythema, swelling, or exudate on post pharynx.  Tonsils not swollen or erythematous. Hearing normal.  Neck: Supple Respiratory: Respiratory effort normal, BS equal bilaterally without rales, rhonchi, wheezing or stridor.  Cardio: RRR with no MRGs. Brisk peripheral pulses without edema.  Abdomen: Soft, + BS.  Generalized left sided tenderness, worst in LLQ and suprapubic, no guarding, rebound, hernias, masses. Lymphatics: Non tender without lymphadenopathy.  Musculoskeletal: Full ROM, no obvious deformity, normal gait.  Skin: Warm, dry without rashes, lesions, ecchymosis.  Neuro: Normal muscle tone, no cerebellar symptoms.  Psych: Awake  and oriented X 3, normal affect, Insight and Judgment appropriate.     Izora Ribas, NP 10:25 AM [facility]

## 2019-02-12 NOTE — Patient Instructions (Signed)
Kidney Stones  Kidney stones (urolithiasis) are solid, rock-like deposits that form inside of the organs that make urine (kidneys). A kidney stone may form in a kidney and move into the bladder, where it can cause intense pain and block the flow of urine. Kidney stones are created when high levels of certain minerals are found in the urine. They are usually passed through urination, but in some cases, medical treatment may be needed to remove them. What are the causes? Kidney stones may be caused by:  A condition in which certain glands produce too much parathyroid hormone (primary hyperparathyroidism), which causes too much calcium buildup in the blood.  Buildup of uric acid crystals in the bladder (hyperuricosuria). Uric acid is a chemical that the body produces when you eat certain foods. It usually exits the body in the urine.  Narrowing (stricture) of one or both of the tubes that drain urine from the kidneys to the bladder (ureters).  A kidney blockage that is present at birth (congenital obstruction).  Past surgery on the kidney or the ureters, such as gastric bypass surgery. What increases the risk? The following factors make you more likely to develop kidney stones:  Having had a kidney stone in the past.  Having a family history of kidney stones.  Not drinking enough water.  Eating a diet that is high in protein, salt (sodium), or sugar.  Being overweight or obese. What are the signs or symptoms? Symptoms of a kidney stone may include:  Nausea.  Vomiting.  Blood in the urine (hematuria).  Pain in the side of the abdomen, right below the ribs (flank pain). Pain usually spreads (radiates) to the groin.  Needing to urinate frequently or urgently. How is this diagnosed? This condition may be diagnosed based on:  Your medical history.  A physical exam.  Blood tests.  Urine tests.  CT scan.  Abdominal X-ray.  A procedure to examine the inside of the bladder  (cystoscopy). How is this treated? Treatment for kidney stones depends on the size, location, and makeup of the stones. Treatment may involve:  Analyzing your urine before and after you pass the stone through urination.  Being monitored at the hospital until you pass the stone through urination.  Increasing your fluid intake and decreasing the amount of calcium and protein in your diet.  A procedure to break up kidney stones in the bladder using: ? A focused beam of light (laser therapy). ? Shock waves (extracorporeal shock wave lithotripsy).  Surgery to remove kidney stones. This may be needed if you have severe pain or have stones that block your urinary tract. Follow these instructions at home: Eating and drinking  Drink enough fluid to keep your urine clear or pale yellow. This will help you to pass the kidney stone.  If directed, change your diet. This may include: ? Limiting how much sodium you eat. ? Eating more fruits and vegetables. ? Limiting how much meat, poultry, fish, and eggs you eat.  Follow instructions from your health care provider about eating or drinking restrictions. General instructions  Collect urine samples as told by your health care provider. You may need to collect a urine sample: ? 24 hours after you pass the stone. ? 8-12 weeks after passing the kidney stone, and every 6-12 months after that.  Strain your urine every time you urinate, for as long as directed. Use the strainer that your health care provider recommends.  Do not throw out the kidney stone after passing   it. Keep the stone so it can be tested by your health care provider. Testing the makeup of your kidney stone may help prevent you from getting kidney stones in the future.  Take over-the-counter and prescription medicines only as told by your health care provider.  Keep all follow-up visits as told by your health care provider. This is important. You may need follow-up X-rays or  ultrasounds to make sure that your stone has passed. How is this prevented? To prevent another kidney stone:  Drink enough fluid to keep your urine clear or pale yellow. This is the best way to prevent kidney stones.  Eat a healthy diet and follow recommendations from your health care provider about foods to avoid. You may be instructed to eat a low-protein diet. Recommendations vary depending on the type of kidney stone that you have.  Maintain a healthy weight. Contact a health care provider if:  You have pain that gets worse or does not get better with medicine. Get help right away if:  You have a fever or chills.  You develop severe pain.  You develop new abdominal pain.  You faint.  You are unable to urinate. This information is not intended to replace advice given to you by your health care provider. Make sure you discuss any questions you have with your health care provider. Document Released: 03/28/2005 Document Revised: 11/07/2017 Document Reviewed: 09/11/2015 Elsevier Patient Education  2020 [employer].      Dietary Guidelines to Help Prevent Kidney Stones Kidney stones are deposits of minerals and salts that form inside your kidneys. Your risk of developing kidney stones may be greater depending on your diet, your lifestyle, the medicines you take, and whether you have certain medical conditions. Most people can reduce their chances of developing kidney stones by following the instructions below. Depending on your overall health and the type of kidney stones you tend to develop, your dietitian may give you more specific instructions. What are tips for following this plan? Reading food labels  Choose foods with "no salt added" or "low-salt" labels. Limit your sodium intake to less than 1500 mg per day.  Choose foods with calcium for each meal and snack. Try to eat about 300 mg of calcium at each meal. Foods that contain 200-500 mg of calcium per serving include: ?  8 oz (237 ml) of milk, fortified nondairy milk, and fortified fruit juice. ? 8 oz (237 ml) of kefir, yogurt, and soy yogurt. ? 4 oz (118 ml) of tofu. ? 1 oz of cheese. ? 1 cup (300 g) of dried figs. ? 1 cup (91 g) of cooked broccoli. ? 1-3 oz can of sardines or mackerel.  Most people need 1000 to 1500 mg of calcium each day. Talk to your dietitian about how much calcium is recommended for you. Shopping  Buy plenty of fresh fruits and vegetables. Most people do not need to avoid fruits and vegetables, even if they contain nutrients that may contribute to kidney stones.  When shopping for convenience foods, choose: ? Whole pieces of fruit. ? Premade salads with dressing on the side. ? Low-fat fruit and yogurt smoothies.  Avoid buying frozen meals or prepared deli foods.  Look for foods with live cultures, such as yogurt and kefir. Cooking  Do not add salt to food when cooking. Place a salt shaker on the table and allow each person to add his or her own salt to taste.  Use vegetable protein, such as beans, textured vegetable  protein (TVP), or tofu instead of meat in pasta, casseroles, and soups. Meal planning   Eat less salt, if told by your dietitian. To do this: ? Avoid eating processed or premade food. ? Avoid eating fast food.  Eat less animal protein, including cheese, meat, poultry, or fish, if told by your dietitian. To do this: ? Limit the number of times you have meat, poultry, fish, or cheese each week. Eat a diet free of meat at least 2 days a week. ? Eat only one serving each day of meat, poultry, fish, or seafood. ? When you prepare animal protein, cut pieces into small portion sizes. For most meat and fish, one serving is about the size of one deck of cards.  Eat at least 5 servings of fresh fruits and vegetables each day. To do this: ? Keep fruits and vegetables on hand for snacks. ? Eat 1 piece of fruit or a handful of berries with breakfast. ? Have a salad and  fruit at lunch. ? Have two kinds of vegetables at dinner.  Limit foods that are high in a substance called oxalate. These include: ? Spinach. ? Rhubarb. ? Beets. ? Potato chips and french fries. ? Nuts.  If you regularly take a diuretic medicine, make sure to eat at least 1-2 fruits or vegetables high in potassium each day. These include: ? Avocado. ? Banana. ? Orange, prune, carrot, or tomato juice. ? Baked potato. ? Cabbage. ? Beans and split peas. General instructions   Drink enough fluid to keep your urine clear or pale yellow. This is the most important thing you can do.  Talk to your health care provider and dietitian about taking daily supplements. Depending on your health and the cause of your kidney stones, you may be advised: ? Not to take supplements with vitamin C. ? To take a calcium supplement. ? To take a daily probiotic supplement. ? To take other supplements such as magnesium, fish oil, or vitamin B6.  Take all medicines and supplements as told by your health care provider.  Limit alcohol intake to no more than 1 drink a day for nonpregnant women and 2 drinks a day for men. One drink equals 12 oz of beer, 5 oz of wine, or 1 oz of hard liquor.  Lose weight if told by your health care provider. Work with your dietitian to find strategies and an eating plan that works best for you. What foods are not recommended? Limit your intake of the following foods, or as told by your dietitian. Talk to your dietitian about specific foods you should avoid based on the type of kidney stones and your overall health. Grains Breads. Bagels. Rolls. Baked goods. Salted crackers. Cereal. Pasta. Vegetables Spinach. Rhubarb. Beets. Canned vegetables. Angie Fava. Olives. Meats and other protein foods Nuts. Nut butters. Large portions of meat, poultry, or fish. Salted or cured meats. Deli meats. Hot dogs. Sausages. Dairy Cheese. Beverages Regular soft drinks. Regular vegetable juice.  Seasonings and other foods Seasoning blends with salt. Salad dressings. Canned soups. Soy sauce. Ketchup. Barbecue sauce. Canned pasta sauce. Casseroles. Pizza. Lasagna. Frozen meals. Potato chips. [place] fries. Summary  You can reduce your risk of kidney stones by making changes to your diet.  The most important thing you can do is drink enough fluid. You should drink enough fluid to keep your urine clear or pale yellow.  Ask your health care provider or dietitian how much protein from animal sources you should eat each day, and also  how much salt and calcium you should have each day. This information is not intended to replace advice given to you by your health care provider. Make sure you discuss any questions you have with your health care provider. Document Released: 07/23/2010 Document Revised: 07/18/2018 Document Reviewed: 03/08/2016 Elsevier Patient Education  2020 [employer].

## 2019-02-13 LAB — COMPLETE METABOLIC PANEL WITH GFR
AG Ratio: 1.3 (calc) (ref 1.0–2.5)
ALT: 31 U/L — ABNORMAL HIGH (ref 6–29)
AST: 23 U/L (ref 10–30)
Albumin: 4.3 g/dL (ref 3.6–5.1)
Alkaline phosphatase (APISO): 50 U/L (ref 31–125)
BUN/Creatinine Ratio: 12 (calc) (ref 6–22)
BUN: 6 mg/dL — ABNORMAL LOW (ref 7–25)
CO2: 27 mmol/L (ref 20–32)
Calcium: 9.1 mg/dL (ref 8.6–10.2)
Chloride: 106 mmol/L (ref 98–110)
Creat: 0.52 mg/dL (ref 0.50–1.10)
GFR, Est African American: 139 mL/min/{1.73_m2} (ref >=60)
GFR, Est Non African American: 120 mL/min/{1.73_m2} (ref >=60)
Globulin: 3.3 g/dL (calc) (ref 1.9–3.7)
Glucose, Bld: 101 mg/dL — ABNORMAL HIGH (ref 65–99)
Potassium: 3.9 mmol/L (ref 3.5–5.3)
Sodium: 141 mmol/L (ref 135–146)
Total Bilirubin: 0.5 mg/dL (ref 0.2–1.2)
Total Protein: 7.6 g/dL (ref 6.1–8.1)

## 2019-02-13 LAB — URINALYSIS, ROUTINE W REFLEX MICROSCOPIC
Bacteria, UA: NONE SEEN /HPF
Bilirubin Urine: NEGATIVE
Glucose, UA: NEGATIVE
Hyaline Cast: NONE SEEN /LPF
Ketones, ur: NEGATIVE
Leukocytes,Ua: NEGATIVE
Nitrite: NEGATIVE
Protein, ur: NEGATIVE
Specific Gravity, Urine: 1.016 (ref 1.001–1.03)
pH: 7.5 (ref 5.0–8.0)

## 2019-02-13 LAB — CBC WITH DIFFERENTIAL/PLATELET
Absolute Monocytes: 409 cells/uL (ref 200–950)
Basophils Absolute: 31 cells/uL (ref 0–200)
Basophils Relative: 0.5 %
Eosinophils Absolute: 68 cells/uL (ref 15–500)
Eosinophils Relative: 1.1 %
HCT: 42.3 % (ref 35.0–45.0)
Hemoglobin: 14.7 g/dL (ref 11.7–15.5)
Lymphs Abs: 1810 cells/uL (ref 850–3900)
MCH: 32.6 pg (ref 27.0–33.0)
MCHC: 34.8 g/dL (ref 32.0–36.0)
MCV: 93.8 fL (ref 80.0–100.0)
MPV: 10.4 fL (ref 7.5–12.5)
Monocytes Relative: 6.6 %
Neutro Abs: 3881 cells/uL (ref 1500–7800)
Neutrophils Relative %: 62.6 %
Platelets: 238 10*3/uL (ref 140–400)
RBC: 4.51 10*6/uL (ref 3.80–5.10)
RDW: 11.9 % (ref 11.0–15.0)
Total Lymphocyte: 29.2 %
WBC: 6.2 10*3/uL (ref 3.8–10.8)

## 2019-02-14 ENCOUNTER — Encounter: Payer: Self-pay | Admitting: Adult Health

## 2019-02-14 ENCOUNTER — Ambulatory Visit
Admission: RE | Admit: 2019-02-14 | Discharge: 2019-02-14 | Disposition: A | Discharge status: Home / Self Care | Payer: 59 | Source: Ambulatory Visit | Attending: Adult Health | Admitting: Adult Health

## 2019-02-14 DIAGNOSIS — R109 Unspecified abdominal pain: Secondary | ICD-10-CM

## 2019-02-14 DIAGNOSIS — K760 Fatty (change of) liver, not elsewhere classified: Secondary | ICD-10-CM

## 2019-02-14 DIAGNOSIS — R1032 Left lower quadrant pain: Secondary | ICD-10-CM

## 2019-02-14 DIAGNOSIS — K5730 Diverticulosis of large intestine without perforation or abscess without bleeding: Secondary | ICD-10-CM | POA: Insufficient documentation

## 2019-02-14 DIAGNOSIS — G43909 Migraine, unspecified, not intractable, without status migrainosus: Secondary | ICD-10-CM | POA: Insufficient documentation

## 2019-02-14 HISTORY — DX: Fatty (change of) liver, not elsewhere classified: K76.0

## 2019-02-14 HISTORY — DX: Diverticulosis of large intestine without perforation or abscess without bleeding: K57.30

## 2019-02-15 ENCOUNTER — Other Ambulatory Visit: Payer: Self-pay | Admitting: Physician Assistant

## 2019-02-15 DIAGNOSIS — K219 Gastro-esophageal reflux disease without esophagitis: Secondary | ICD-10-CM

## 2019-02-16 ENCOUNTER — Other Ambulatory Visit: Payer: Self-pay | Admitting: Physician Assistant

## 2019-02-16 DIAGNOSIS — F419 Anxiety disorder, unspecified: Secondary | ICD-10-CM

## 2019-02-16 DIAGNOSIS — R0989 Other specified symptoms and signs involving the circulatory and respiratory systems: Secondary | ICD-10-CM

## 2019-02-18 ENCOUNTER — Other Ambulatory Visit: Payer: Self-pay | Admitting: Adult Health

## 2019-02-18 DIAGNOSIS — R102 Pelvic and perineal pain: Secondary | ICD-10-CM

## 2019-03-21 ENCOUNTER — Encounter: Payer: Self-pay | Admitting: Physician Assistant

## 2019-04-16 NOTE — Progress Notes (Signed)
Complete Physical  Assessment and Plan: RUQ pain -     US Abdomen Complete  AB pain; Future - CT showed gallstones, patient does have GERD/left AB pain occ and shoulder blade discomfort, + murphy sign, labs normal but will get [place] to better see gallbladder, may refer to gen surgery for evaluation pending [place], ER precautions discussed.   Encounter for general adult medical examination with abnormal findings 1 year  Fatty liver Check labs, avoid tylenol, alcohol, weight loss advised.   Mixed hyperlipidemia -     Lipid Profile check lipids decrease fatty foods increase activity.   Labile hypertension -     metoprolol succinate (TOPROL-XL) 25 MG 24 hr tablet; Take 1 tablet Daily for BP & Migraine Prevention - continue medications, DASH diet, exercise and monitor at home. Call if greater than 130/80.  -     CBC with Diff -     COMPLETE METABOLIC PANEL WITH GFR -     TSH -     Urinalysis, Routine w reflex microscopic -     Microalbumin / Creatinine Urine Ratio  Sleep apnea with hypersomnolence Continue to follow up Dr. Toy Cookey, continue mask  Anxiety -     metoprolol succinate (TOPROL-XL) 25 MG 24 hr tablet; Take 1 tablet Daily for BP & Migraine Prevention  Obesity (BMI 30.0-34.9) -     topiramate (TOPAMAX) 50 MG tablet; Take 1 tablet (50 mg total) by mouth 2 (two) times daily. Will try to weight loss and possible migraine prevention/neck pain  Medication management -     Magnesium  Vitamin D deficiency -     Vitamin D (25 hydroxy)  Chronic nonintractable headache, unspecified headache type -     metoprolol succinate (TOPROL-XL) 25 MG 24 hr tablet; Take 1 tablet Daily for BP & Migraine Prevention -     topiramate (TOPAMAX) 50 MG tablet; Take 1 tablet (50 mg total) by mouth 2 (two) times daily. - will try preventable medications to reduce the need for celebrex Patient knows to not take celebrex/ibuprofen together or same day  Screening for diabetes mellitus -     Hemoglobin  A1c ([facility])  Incomplete bladder emptying -     Culture, Urine - had bladder wall thickening on CT- has had work up 10 + years ago, pending urine may refer back to urology for evaluation- assure benign pathology  Screening, anemia, deficiency, iron -     Vitamin B12  Family history of cardiovascular disease -     EKG 12-Lead   Discussed med's effects and SE's. Screening labs and tests as requested with regular follow-up as recommended. Over 40 minutes of exam, counseling, chart review, and complex, high level critical decision making was performed this visit.   HPI  44 y.o. female  presents for a complete physical and follow up for has Sleep apnea with hypersomnolence; Anxiety; Menorrhagia; Hyperlipidemia; Labile hypertension; Obesity (BMI 30.0-34.9); Pelvic pain in female; and Fatty liver on their problem list..  She has had a history of hematuria, her last CT scan was bladder wall thickening, she has had some urinary hesitancy or feeling of incomplete emptying, has had a cystoscopy several years ago, 10+ years.   She has gallstones and fatty liver on the CT, mom just had emergency gallbladder removal.   She has been seeing a therapist x march, she is taking the xanax AS needed 1/2-1 tablet 2-3 x a week. Counselor suggests trying a SSRI, she did try celexa but did not try it  for long term. She will take the celebrex, 2-3 x a week for headache/neck pain.   Has history of palpitations, normal holter 2014.  First MI in father at 76, died at 5 from MI, was alcoholic.    She has demanding job/3 kids.  Has sleep apnea, has mouth piece with Dr. Toy Cookey, sleep study 2016.    BMI is Body mass index is 30.57 kg/m., she is working on diet and exercise. Wt Readings from Last 3 Encounters:  04/17/19 172 lb 9.6 oz (78.3 kg)  02/12/19 173 lb 9.6 oz (78.7 kg)  01/22/19 174 lb (78.9 kg)   She is s/p endometrial ablation and BSO and she has felt better since that time.  Lab Results  Component  Value Date   IRON 109 03/19/2018   TIBC 353 03/19/2018   Her blood pressure has been controlled at home, BP is doing well with an increase in the metoprolol XL to 25 mg a day, she is cutting the 100mg  into 1/4, today their BP is BP: 120/86 She does not workout, she does some walking. She denies chest pain, shortness of breath, dizziness.    She is not on cholesterol medication and denies myalgias. Her cholesterol is at goal. The cholesterol last visit was:   Lab Results  Component Value Date   CHOL 154 03/19/2018   HDL 45 (L) 03/19/2018   LDLCALC 81 03/19/2018   TRIG 180 (H) 03/19/2018   CHOLHDL 3.4 03/19/2018    Last A1C in the office was:  Lab Results  Component Value Date   HGBA1C 5.1 01/27/2016   Patient is on Vitamin D supplement.   Lab Results  Component Value Date   VD25OH 16 (L) 03/19/2018     Current Medications:    Current Outpatient Medications (Cardiovascular):  .  metoprolol succinate (TOPROL-XL) 100 MG 24 hr tablet, Take 1/2 to 1 tablet Daily for BP & Migraine Prevention   Current Outpatient Medications (Analgesics):  .  celecoxib (CELEBREX) 200 MG capsule, Take 1 capsule (200 mg total) by mouth daily. Marland Kitchen  ibuprofen (ADVIL,MOTRIN) 200 MG tablet, Take 600 mg by mouth every 6 (six) hours as needed for headache or moderate pain.   Current Outpatient Medications (Other):  Marland Kitchen  ALPRAZolam (XANAX) 0.25 MG tablet, Take 1 - 2 to 1 tablet 1 or 2 x /day if needed for Anxiety Attack .  famotidine (PEPCID) 40 MG tablet, Take 1 tablet (40 mg total) by mouth every evening. .  meclizine (ANTIVERT) 25 MG tablet, Take 1/2 to 1 tablet 2 to 3 x /day as needed for Dizziness / Vertigo .  metroNIDAZOLE (METROCREAM) 0.75 % cream, Apply topically 2 (two) times daily. .  valACYclovir (VALTREX) 500 MG tablet, Take 1 tablet (500 mg total) by mouth 2 (two) times daily. (Patient taking differently: Take 500 mg by mouth 2 (two) times daily as needed. Prn)  Allergies:  Allergies  Allergen  Reactions  . Codeine Nausea And Vomiting  . Azithromycin Nausea And Vomiting  . Chlorhexidine Gluconate Rash    CHG   Medical History:  She has Sleep apnea with hypersomnolence; Anxiety; Menorrhagia; Hyperlipidemia; Labile hypertension; Obesity (BMI 30.0-34.9); Pelvic pain in female; and Fatty liver on their problem list.   Health Maintenance:   Immunization History  Administered Date(s) Administered  . Influenza-Unspecified 01/12/2015  . PPD Test 11/11/2013  . Tdap 04/11/2010   HPV declines Tetanus: 2012 Pneumovax: Prevnar 13:  Flu vaccine: 2020 at work Zostavax:  Patient's last menstrual period was  04/08/2019. Pap: 2020, q 3 years [facility]er MGM: 2020 DEXA: Colonoscopy:  EGD: Sleep study 2016  Patient Care Team: Unk Pinto, MD as PCP - General (Internal Medicine) Rozetta Nunnery, MD as Consulting Physician (Otolaryngology) Cheri Fowler, MD as Consulting Physician (Obstetrics and Gynecology) [place], [place] Eduardo Osier., MD as Attending Physician (Urology) Earlie Server, MD as Consulting Physician (Orthopedic Surgery)  Surgical History:  She has a past surgical history that includes Mass excision (Left, 01/14/2016); Dilation and curettage of uterus (age 34); Essure tubal ligation (Bilateral, Jan or Feb 2012   dr meisinger office); Laparoscopic bilateral salpingectomy (Bilateral, 09/01/2017); and Endometrial ablation (N/A, 09/01/2017). Family History:  Herfamily history includes Alcohol abuse in her father; Diabetes in her paternal uncle; Heart disease in her father, maternal grandmother, and mother; Hyperlipidemia in her father; Hypertension in her father; Stroke in her paternal uncle. Social History:  She reports that she has never smoked. She has never used smokeless tobacco. She reports current alcohol use. She reports that she does not use drugs.  Review of Systems: Review of Systems  Constitutional: Negative.  Negative for chills and fever.  HENT:  Negative.   Eyes: Negative.   Respiratory: Negative.   Cardiovascular: Negative.   Gastrointestinal: Positive for abdominal pain and heartburn. Negative for blood in stool, constipation, diarrhea, melena, nausea and vomiting.  Genitourinary: Negative.  Negative for dysuria, flank pain, frequency, hematuria and urgency.       Incomplete bladder emptying/frequency  Musculoskeletal: Positive for neck pain. Negative for back pain, falls, joint pain and myalgias.  Skin: Negative.   Neurological: Positive for headaches. Negative for dizziness, tingling, tremors, sensory change, speech change, focal weakness, seizures, loss of consciousness and weakness.    Physical Exam: Estimated body mass index is 30.57 kg/m as calculated from the following:   Height as of this encounter: 5\' 3"  (1.6 m).   Weight as of this encounter: 172 lb 9.6 oz (78.3 kg). BP 120/86   Pulse 71   Temp 98.1 F (36.7 C)   Ht 5\' 3"  (1.6 m)   Wt 172 lb 9.6 oz (78.3 kg)   LMP 04/08/2019   SpO2 98%   BMI 30.57 kg/m  General Appearance: Well nourished, in no apparent distress.  Eyes: PERRLA, EOMs, conjunctiva no swelling or erythema, normal fundi and vessels.  Sinuses: No Frontal/maxillary tenderness  ENT/Mouth: Ext aud canals clear, normal light reflex with TMs without erythema, bulging. Good dentition. No erythema, swelling, or exudate on post pharynx. Tonsils not swollen or erythematous. Hearing normal.  Neck: Supple, thyroid normal. No bruits  Respiratory: Respiratory effort normal, BS equal bilaterally without rales, rhonchi, wheezing or stridor.  Cardio: RRR without murmurs, rubs or gallops. Brisk peripheral pulses without edema.  Chest: symmetric, with normal excursions and percussion.  Breasts: defer Abdomen: Soft, + RUQ tenderness with + murphy's, no guarding, rebound, hernias, masses, or organomegaly.  Lymphatics: Non tender without lymphadenopathy.  Genitourinary: defer Musculoskeletal: Full ROM all peripheral  extremities,5/5 strength, and normal gait.  Skin: Warm, dry without rashes, lesions, ecchymosis. Neuro: Cranial nerves intact, reflexes equal bilaterally. Normal muscle tone, no cerebellar symptoms. Sensation intact.  Psych: Awake and oriented X 3, normal affect, Insight and Judgment appropriate.   EKG: WNL no ST changes. AORTA SCAN: defer    Emily Nolan 3:19 PM [facility]

## 2019-04-17 ENCOUNTER — Encounter: Payer: Self-pay | Admitting: Physician Assistant

## 2019-04-17 ENCOUNTER — Ambulatory Visit (INDEPENDENT_AMBULATORY_CARE_PROVIDER_SITE_OTHER): Payer: Managed Care, Other (non HMO) | Admitting: Physician Assistant

## 2019-04-17 ENCOUNTER — Other Ambulatory Visit: Payer: Self-pay

## 2019-04-17 VITALS — BP 120/86 | HR 71 | Temp 98.1°F | Ht 63.0 in | Wt 172.6 lb

## 2019-04-17 DIAGNOSIS — F419 Anxiety disorder, unspecified: Secondary | ICD-10-CM

## 2019-04-17 DIAGNOSIS — Z130 Encounter for screening for diseases of the blood and blood-forming organs and certain disorders involving the immune mechanism: Secondary | ICD-10-CM

## 2019-04-17 DIAGNOSIS — G4710 Hypersomnia, unspecified: Secondary | ICD-10-CM

## 2019-04-17 DIAGNOSIS — K760 Fatty (change of) liver, not elsewhere classified: Secondary | ICD-10-CM | POA: Diagnosis not present

## 2019-04-17 DIAGNOSIS — R0989 Other specified symptoms and signs involving the circulatory and respiratory systems: Secondary | ICD-10-CM

## 2019-04-17 DIAGNOSIS — Z0001 Encounter for general adult medical examination with abnormal findings: Secondary | ICD-10-CM

## 2019-04-17 DIAGNOSIS — G4730 Sleep apnea, unspecified: Secondary | ICD-10-CM

## 2019-04-17 DIAGNOSIS — Z131 Encounter for screening for diabetes mellitus: Secondary | ICD-10-CM

## 2019-04-17 DIAGNOSIS — R1011 Right upper quadrant pain: Secondary | ICD-10-CM

## 2019-04-17 DIAGNOSIS — E669 Obesity, unspecified: Secondary | ICD-10-CM

## 2019-04-17 DIAGNOSIS — R519 Headache, unspecified: Secondary | ICD-10-CM

## 2019-04-17 DIAGNOSIS — Z8249 Family history of ischemic heart disease and other diseases of the circulatory system: Secondary | ICD-10-CM

## 2019-04-17 DIAGNOSIS — Z136 Encounter for screening for cardiovascular disorders: Secondary | ICD-10-CM

## 2019-04-17 DIAGNOSIS — Z79899 Other long term (current) drug therapy: Secondary | ICD-10-CM

## 2019-04-17 DIAGNOSIS — E782 Mixed hyperlipidemia: Secondary | ICD-10-CM | POA: Diagnosis not present

## 2019-04-17 DIAGNOSIS — R339 Retention of urine, unspecified: Secondary | ICD-10-CM

## 2019-04-17 DIAGNOSIS — E559 Vitamin D deficiency, unspecified: Secondary | ICD-10-CM

## 2019-04-17 MED ORDER — METOPROLOL SUCCINATE ER 25 MG PO TB24: ORAL_TABLET | ORAL | 2 refills | Status: DC

## 2019-04-17 MED ORDER — TOPIRAMATE 50 MG PO TABS: 50.0000 mg | ORAL_TABLET | Freq: Two times a day (BID) | ORAL | 2 refills | Status: DC

## 2019-04-17 NOTE — Patient Instructions (Addendum)
VITAMIN D IS IMPORTANT  Vitamin D goal is between 60-80  Please make sure that you are taking your Vitamin D as directed.   It is very important as a natural anti-inflammatory   helping hair, skin, and nails, as well as reducing stroke and heart attack risk.   It helps your bones and helps with mood.  We want you on at least 5000 IU daily  It also decreases numerous cancer risks so please take it as directed.   Low Vit D is associated with a 200-300% higher risk for CANCER   and 200-300% higher risk for HEART   ATTACK  &  STROKE.    .....................................Marland Kitchen  It is also associated with higher death rate at younger ages,   autoimmune diseases like Rheumatoid arthritis, Lupus, Multiple Sclerosis.     Also many other serious conditions, like depression, Alzheimer's  Dementia, infertility, muscle aches, fatigue, fibromyalgia - just to name a few.  +++++++++++++++++++  Can get liquid vitamin D from [facility] here in [place] at  [facility] alternatives [address] [place], [place] [address] Or you can try earth fare   TOPAMAX Going to start you on topamax, start on 1/2 pill for 3-5 nights, can increase to a whole pill for 1-2 weeks.  Can increase to 1 pill at night and 1/2-1 pill in the AM.  This medication is good for weight loss, headaches, neck pain This medication can cause numbness, tingling and can cause brain fog- stop if you get these If you get blurry vision or have a history of glaucoma please stop this medication.  Let me know how you are doing with this.   Topiramate tablets What is this medicine? TOPIRAMATE (toe PYRE a mate) is used to treat seizures in adults or children with epilepsy. It is also used for the prevention of migraine headaches. This medicine may be used for other purposes; ask your health care provider or pharmacist if you have questions. COMMON BRAND NAME(S): Topamax, Topiragen What should I tell my health care provider before I  take this medicine? They need to know if you have any of these conditions: -bleeding disorders -cirrhosis of the liver or liver disease -diarrhea -glaucoma -kidney stones or kidney disease -low blood counts, like low white cell, platelet, or red cell counts -lung disease like asthma, obstructive pulmonary disease, emphysema -metabolic acidosis -on a ketogenic diet -schedule for surgery or a procedure -suicidal thoughts, plans, or attempt; a previous suicide attempt by you or a family member -an unusual or allergic reaction to topiramate, other medicines, foods, dyes, or preservatives -pregnant or trying to get pregnant -breast-feeding How should I use this medicine? Take this medicine by mouth with a glass of water. Follow the directions on the prescription label. Do not crush or chew. You may take this medicine with meals. Take your medicine at regular intervals. Do not take it more often than directed. Talk to your pediatrician regarding the use of this medicine in children. Special care may be needed. While this drug may be prescribed for children as young as 64 years of age for selected conditions, precautions do apply. Overdosage: If you think you have taken too much of this medicine contact a poison control center or emergency room at once. NOTE: This medicine is only for you. Do not share this medicine with others. What if I miss a dose? If you miss a dose, take it as soon as you can. If your next dose is to be taken  in less than 6 hours, then do not take the missed dose. Take the next dose at your regular time. Do not take double or extra doses. What may interact with this medicine? Do not take this medicine with any of the following medications: -probenecid This medicine may also interact with the following medications: -acetazolamide -alcohol -amitriptyline -aspirin and aspirin-like medicines -birth control pills -certain medicines for depression -certain medicines for  seizures -certain medicines that treat or prevent blood clots like warfarin, enoxaparin, dalteparin, apixaban, dabigatran, and rivaroxaban -digoxin -hydrochlorothiazide -lithium -medicines for pain, sleep, or muscle relaxation -metformin -methazolamide -NSAIDS, medicines for pain and inflammation, like ibuprofen or naproxen -pioglitazone -risperidone This list may not describe all possible interactions. Give your health care provider a list of all the medicines, herbs, non-prescription drugs, or dietary supplements you use. Also tell them if you smoke, drink alcohol, or use illegal drugs. Some items may interact with your medicine. What should I watch for while using this medicine? Visit your doctor or health care professional for regular checks on your progress. Do not stop taking this medicine suddenly. This increases the risk of seizures if you are using this medicine to control epilepsy. Wear a medical identification bracelet or chain to say you have epilepsy or seizures, and carry a card that lists all your medicines. This medicine can decrease sweating and increase your body temperature. Watch for signs of deceased sweating or fever, especially in children. Avoid extreme heat, hot baths, and saunas. Be careful about exercising, especially in hot weather. Contact your health care provider right away if you notice a fever or decrease in sweating. You should drink plenty of fluids while taking this medicine. If you have had kidney stones in the past, this will help to reduce your chances of forming kidney stones. If you have stomach pain, with nausea or vomiting and yellowing of your eyes or skin, call your doctor immediately. You may get drowsy, dizzy, or have blurred vision. Do not drive, use machinery, or do anything that needs mental alertness until you know how this medicine affects you. To reduce dizziness, do not sit or stand up quickly, especially if you are an older patient. Alcohol can  increase drowsiness and dizziness. Avoid alcoholic drinks. If you notice blurred vision, eye pain, or other eye problems, seek medical attention at once for an eye exam. The use of this medicine may increase the chance of suicidal thoughts or actions. Pay special attention to how you are responding while on this medicine. Any worsening of mood, or thoughts of suicide or dying should be reported to your health care professional right away. This medicine may increase the chance of developing metabolic acidosis. If left untreated, this can cause kidney stones, bone disease, or slowed growth in children. Symptoms include breathing fast, fatigue, loss of appetite, irregular heartbeat, or loss of consciousness. Call your doctor immediately if you experience any of these side effects. Also, tell your doctor about any surgery you plan on having while taking this medicine since this may increase your risk for metabolic acidosis. Birth control pills may not work properly while you are taking this medicine. Talk to your doctor about using an extra method of birth control. Women who become pregnant while using this medicine may enroll in the [place] Pregnancy Registry by calling [phone]. This registry collects information about the safety of antiepileptic drug use during pregnancy. What side effects may I notice from receiving this medicine? Side effects that you should report  to your doctor or health care professional as soon as possible: -allergic reactions like skin rash, itching or hives, swelling of the face, lips, or tongue -decreased sweating and/or rise in body temperature -depression -difficulty breathing, fast or irregular breathing patterns -difficulty speaking -difficulty walking or controlling muscle movements -hearing impairment -redness, blistering, peeling or loosening of the skin, including inside the mouth -tingling, pain or numbness in the hands or  feet -unusual bleeding or bruising -unusually weak or tired -worsening of mood, thoughts or actions of suicide or dying Side effects that usually do not require medical attention (report to your doctor or health care professional if they continue or are bothersome): -altered taste -back pain, joint or muscle aches and pains -diarrhea, or constipation -headache -loss of appetite -nausea -stomach upset, indigestion -tremors This list may not describe all possible side effects. Call your doctor for medical advice about side effects. You may report side effects to FDA at 1-800-FDA-1088. Where should I keep my medicine? Keep out of the reach of children. Store at room temperature between 15 and 30 degrees C (59 and 86 degrees F) in a tightly closed container. Protect from moisture. Throw away any unused medicine after the expiration date. NOTE: This sheet is a summary. It may not cover all possible information. If you have questions about this medicine, talk to your doctor, pharmacist, or health care provider.  2018 Elsevier/Gold Standard (2013-04-01 23:17:57)  Fatty liver or Nonalcoholic fatty liver disease (NASH)  Now the leading cause of liver failure in the [place].  It is normally from such risk factors as obesity, diabetes, insulin resistance, high cholesterol, or metabolic syndrome.  The only definitive therapy is weight loss and exercise.    Suggest walking 20-30 mins daily.  Decreasing carbohydrates, increasing veggies.  Vitamin E 800 IU a day may be beneficial. Liver cancer has been noted in patient with fatty liver without cirrhosis.  Will monitor closely   Fatty Liver Fatty liver is the accumulation of fat in liver cells. It is also called hepatosteatosis or steatohepatitis. It is normal for your liver to contain some fat. If fat is more than 5 to 10% of your liver's weight, you have fatty liver.  There are often no symptoms (problems) for years while damage is still  occurring. People often learn about their fatty liver when they have medical tests for other reasons. Fat can damage your liver for years or even decades without causing problems. When it becomes severe, it can cause fatigue, weight loss, weakness, and confusion. This makes you more likely to develop more serious liver problems. The liver is the largest organ in the body. It does a lot of work and often gives no warning signs when it is sick until late in a disease. The liver has many important jobs including:  Breaking down foods.  Storing vitamins, iron, and other minerals.  Making proteins.  Making bile for food digestion.  Breaking down many products including medications, alcohol and some poisons.  PROGNOSIS  Fatty liver may cause no damage or it can lead to an inflammation of the liver. This is, called steatohepatitis.  Over time the liver may become scarred and hardened. This condition is called cirrhosis. Cirrhosis is serious and may lead to liver failure or cancer. NASH is one of the leading causes of cirrhosis. About 10-20% of Americans have fatty liver and a smaller 2-5% has NASH.  TREATMENT   Weight loss, fat restriction, and exercise in overweight patients produces inconsistent results  but is worth trying.  Good control of diabetes may reduce fatty liver.  Eat a balanced, healthy diet.  Increase your physical activity.  There are no medical or surgical treatments for a fatty liver or NASH, but improving your diet and increasing your exercise may help prevent or reverse some of the damage.   azzzzzzzz Cholelithiasis  Cholelithiasis is a form of gallbladder disease in which gallstones form in the gallbladder. The gallbladder is an organ that stores bile. Bile is made in the liver, and it helps to digest fats. Gallstones begin as small crystals and slowly grow into stones. They may cause no symptoms until the gallbladder tightens (contracts) and a gallstone is blocking the  duct (gallbladder attack), which can cause pain. Cholelithiasis is also referred to as gallstones. There are two main types of gallstones:  Cholesterol stones. These are made of hardened cholesterol and are usually yellow-green in color. They are the most common type of gallstone. Cholesterol is a white, waxy, fat-like substance that is made in the liver.  Pigment stones. These are dark in color and are made of a red-yellow substance that forms when hemoglobin from red blood cells breaks down (bilirubin). What are the causes? This condition may be caused by an imbalance in the substances that bile is made of. This can happen if the bile:  Has too much bilirubin.  Has too much cholesterol.  Does not have enough bile salts. These salts help the body absorb and digest fats. In some cases, this condition can also be caused by the gallbladder not emptying completely or often enough. What increases the risk? The following factors may make you more likely to develop this condition:  Being female.  Having multiple pregnancies. Health care providers sometimes advise removing diseased gallbladders before future pregnancies.  Eating a diet that is heavy in fried foods, fat, and refined carbohydrates, like white bread and white rice.  Being obese.  Being older than age 47.  Prolonged use of medicines that contain female hormones (estrogen).  Having diabetes mellitus.  Rapidly losing weight.  Having a family history of gallstones.  Being of [place] or [place] descent.  Having an intestinal disease such as Crohn disease.  Having metabolic syndrome.  Having cirrhosis.  Having severe types of anemia such as sickle cell anemia. What are the signs or symptoms? In most cases, there are no symptoms. These are known as silent gallstones. If a gallstone blocks the bile ducts, it can cause a gallbladder attack. The main symptom of a gallbladder attack is sudden pain in the upper right  abdomen. The pain usually comes at night or after eating a large meal. The pain can last for one or several hours and can spread to the right shoulder or chest. If the bile duct is blocked for more than a few hours, it can cause infection or inflammation of the gallbladder, liver, or pancreas, which may cause:  Nausea.  Vomiting.  Abdominal pain that lasts for 5 hours or more.  Fever or chills.  Yellowing of the skin or the whites of the eyes (jaundice).  Dark urine.  Light-colored stools. How is this diagnosed? This condition may be diagnosed based on:  A physical exam.  Your medical history.  An ultrasound of your gallbladder.  CT scan.  MRI.  Blood tests to check for signs of infection or inflammation.  A scan of your gallbladder and bile ducts (biliary system) using nonharmful radioactive material and special cameras that can see  the radioactive material (cholescintigram). This test checks to see how your gallbladder contracts and whether bile ducts are blocked.  Inserting a small tube with a camera on the end (endoscope) through your mouth to inspect bile ducts and check for blockages (endoscopic retrograde cholangiopancreatogram). How is this treated? Treatment for gallstones depends on the severity of the condition. Silent gallstones do not need treatment. If the gallstones cause a gallbladder attack or other symptoms, treatment may be required. Options for treatment include:  Surgery to remove the gallbladder (cholecystectomy). This is the most common treatment.  Medicines to dissolve gallstones. These are most effective at treating small gallstones. You may need to take medicines for up to 6-12 months.  Shock wave treatment (extracorporeal biliary lithotripsy). In this treatment, an ultrasound machine sends shock waves to the gallbladder to break gallstones into smaller pieces. These pieces can then be passed into the intestines or be dissolved by medicine. This is  rarely used.  Removing gallstones through endoscopic retrograde cholangiopancreatogram. A small basket can be attached to the endoscope and used to capture and remove gallstones. Follow these instructions at home:  Take over-the-counter and prescription medicines only as told by your health care provider.  Maintain a healthy weight and follow a healthy diet. This includes: ? Reducing fatty foods, such as fried food. ? Reducing refined carbohydrates, like white bread and white rice. ? Increasing fiber. Aim for foods like almonds, fruit, and beans.  Keep all follow-up visits as told by your health care provider. This is important. Contact a health care provider if:  You think you have had a gallbladder attack.  You have been diagnosed with silent gallstones and you develop abdominal pain or indigestion. Get help right away if:  You have pain from a gallbladder attack that lasts for more than 2 hours.  You have abdominal pain that lasts for more than 5 hours.  You have a fever or chills.  You have persistent nausea and vomiting.  You develop jaundice.  You have dark urine or light-colored stools. Summary  Cholelithiasis (also called gallstones) is a form of gallbladder disease in which gallstones form in the gallbladder.  This condition is caused by an imbalance in the substances that make up bile. This can happen if the bile has too much cholesterol, too much bilirubin, or not enough bile salts.  You are more likely to develop this condition if you are female, pregnant, using medicines with estrogen, obese, older than age 3, or have a family history of gallstones. You may also develop gallstones if you have diabetes, an intestinal disease, cirrhosis, or metabolic syndrome.  Treatment for gallstones depends on the severity of the condition. Silent gallstones do not need treatment.  If gallstones cause a gallbladder attack or other symptoms, treatment may be needed. The most  common treatment is surgery to remove the gallbladder. This information is not intended to replace advice given to you by your health care provider. Make sure you discuss any questions you have with your health care provider. Document Revised: 03/10/2017 Document Reviewed: 12/13/2015 Elsevier Patient Education  2020 [employer].

## 2019-04-18 ENCOUNTER — Encounter: Payer: Self-pay | Admitting: Physician Assistant

## 2019-04-18 LAB — COMPLETE METABOLIC PANEL WITH GFR
AG Ratio: 1.4 (calc) (ref 1.0–2.5)
ALT: 20 U/L (ref 6–29)
AST: 18 U/L (ref 10–30)
Albumin: 4.3 g/dL (ref 3.6–5.1)
Alkaline phosphatase (APISO): 51 U/L (ref 31–125)
BUN: 15 mg/dL (ref 7–25)
CO2: 29 mmol/L (ref 20–32)
Calcium: 9.2 mg/dL (ref 8.6–10.2)
Chloride: 104 mmol/L (ref 98–110)
Creat: 0.6 mg/dL (ref 0.50–1.10)
GFR, Est African American: 131 mL/min/{1.73_m2} (ref >=60)
GFR, Est Non African American: 113 mL/min/{1.73_m2} (ref >=60)
Globulin: 3 g/dL (calc) (ref 1.9–3.7)
Glucose, Bld: 87 mg/dL (ref 65–99)
Potassium: 4.1 mmol/L (ref 3.5–5.3)
Sodium: 138 mmol/L (ref 135–146)
Total Bilirubin: 0.3 mg/dL (ref 0.2–1.2)
Total Protein: 7.3 g/dL (ref 6.1–8.1)

## 2019-04-18 LAB — MICROALBUMIN / CREATININE URINE RATIO
Creatinine, Urine: 103 mg/dL (ref 20–275)
Microalb Creat Ratio: 7 mcg/mg creat (ref <30)
Microalb, Ur: 0.7 mg/dL

## 2019-04-18 LAB — VITAMIN B12: Vitamin B-12: 418 pg/mL (ref 200–1100)

## 2019-04-18 LAB — URINALYSIS, ROUTINE W REFLEX MICROSCOPIC
Bacteria, UA: NONE SEEN /HPF
Bilirubin Urine: NEGATIVE
Glucose, UA: NEGATIVE
Hyaline Cast: NONE SEEN /LPF
Ketones, ur: NEGATIVE
Leukocytes,Ua: NEGATIVE
Nitrite: NEGATIVE
Protein, ur: NEGATIVE
Specific Gravity, Urine: 1.025 (ref 1.001–1.03)
pH: 7 (ref 5.0–8.0)

## 2019-04-18 LAB — CBC WITH DIFFERENTIAL/PLATELET
Absolute Monocytes: 553 cells/uL (ref 200–950)
Basophils Absolute: 39 cells/uL (ref 0–200)
Basophils Relative: 0.4 %
Eosinophils Absolute: 136 cells/uL (ref 15–500)
Eosinophils Relative: 1.4 %
HCT: 41.7 % (ref 35.0–45.0)
Hemoglobin: 14.6 g/dL (ref 11.7–15.5)
Lymphs Abs: 2241 cells/uL (ref 850–3900)
MCH: 32.6 pg (ref 27.0–33.0)
MCHC: 35 g/dL (ref 32.0–36.0)
MCV: 93.1 fL (ref 80.0–100.0)
MPV: 10.5 fL (ref 7.5–12.5)
Monocytes Relative: 5.7 %
Neutro Abs: 6732 cells/uL (ref 1500–7800)
Neutrophils Relative %: 69.4 %
Platelets: 245 10*3/uL (ref 140–400)
RBC: 4.48 10*6/uL (ref 3.80–5.10)
RDW: 12.1 % (ref 11.0–15.0)
Total Lymphocyte: 23.1 %
WBC: 9.7 10*3/uL (ref 3.8–10.8)

## 2019-04-18 LAB — TSH: TSH: 1.01 mIU/L

## 2019-04-18 LAB — URINE CULTURE
MICRO NUMBER:: 10014478
SPECIMEN QUALITY:: ADEQUATE

## 2019-04-18 LAB — HEMOGLOBIN A1C
Hgb A1c MFr Bld: 5.1 % of total Hgb (ref <5.7)
Mean Plasma Glucose: 100 (calc)
eAG (mmol/L): 5.5 (calc)

## 2019-04-18 LAB — LIPID PANEL
Cholesterol: 160 mg/dL (ref <200)
HDL: 43 mg/dL — ABNORMAL LOW (ref >=50)
LDL Cholesterol (Calc): 80 mg/dL (calc)
Non-HDL Cholesterol (Calc): 117 mg/dL (calc) (ref <130)
Total CHOL/HDL Ratio: 3.7 (calc) (ref <5.0)
Triglycerides: 303 mg/dL — ABNORMAL HIGH (ref <150)

## 2019-04-18 LAB — MAGNESIUM: Magnesium: 2 mg/dL (ref 1.5–2.5)

## 2019-04-18 LAB — VITAMIN D 25 HYDROXY (VIT D DEFICIENCY, FRACTURES): Vit D, 25-Hydroxy: 15 ng/mL — ABNORMAL LOW (ref 30–100)

## 2019-04-27 ENCOUNTER — Other Ambulatory Visit: Payer: Self-pay | Admitting: Internal Medicine

## 2019-04-27 DIAGNOSIS — F419 Anxiety disorder, unspecified: Secondary | ICD-10-CM

## 2019-04-30 ENCOUNTER — Ambulatory Visit
Admission: RE | Admit: 2019-04-30 | Discharge: 2019-04-30 | Disposition: A | Discharge status: Home / Self Care | Payer: Managed Care, Other (non HMO) | Source: Ambulatory Visit | Attending: Physician Assistant | Admitting: Physician Assistant

## 2019-04-30 DIAGNOSIS — R1011 Right upper quadrant pain: Secondary | ICD-10-CM

## 2019-04-30 NOTE — Addendum Note (Signed)
Addended by: Vicie Mutters R on: 04/30/2019 03:01 PM   Modules accepted: Orders

## 2019-05-16 ENCOUNTER — Other Ambulatory Visit: Payer: Self-pay | Admitting: Physician Assistant

## 2019-05-16 DIAGNOSIS — M542 Cervicalgia: Secondary | ICD-10-CM

## 2019-05-20 ENCOUNTER — Other Ambulatory Visit: Payer: Self-pay | Admitting: Physician Assistant

## 2019-05-20 DIAGNOSIS — R079 Chest pain, unspecified: Secondary | ICD-10-CM

## 2019-05-21 ENCOUNTER — Other Ambulatory Visit: Payer: Self-pay | Admitting: General Surgery

## 2019-07-16 ENCOUNTER — Other Ambulatory Visit: Payer: Self-pay | Admitting: Physician Assistant

## 2019-07-16 DIAGNOSIS — E669 Obesity, unspecified: Secondary | ICD-10-CM

## 2019-07-16 DIAGNOSIS — G8929 Other chronic pain: Secondary | ICD-10-CM

## 2019-07-29 ENCOUNTER — Encounter (HOSPITAL_COMMUNITY)
Admission: RE | Admit: 2019-07-29 | Discharge: 2019-07-29 | Disposition: A | Discharge status: Home / Self Care | Payer: 59 | Source: Ambulatory Visit | Attending: General Surgery | Admitting: General Surgery

## 2019-07-29 ENCOUNTER — Other Ambulatory Visit: Payer: Self-pay

## 2019-07-29 ENCOUNTER — Encounter (HOSPITAL_COMMUNITY): Payer: Self-pay

## 2019-07-29 ENCOUNTER — Other Ambulatory Visit (HOSPITAL_COMMUNITY)
Admission: RE | Admit: 2019-07-29 | Discharge: 2019-07-29 | Disposition: A | Discharge status: Home / Self Care | Payer: 59 | Source: Ambulatory Visit | Attending: General Surgery | Admitting: General Surgery

## 2019-07-29 DIAGNOSIS — Z20822 Contact with and (suspected) exposure to covid-19: Secondary | ICD-10-CM | POA: Insufficient documentation

## 2019-07-29 DIAGNOSIS — Z01812 Encounter for preprocedural laboratory examination: Secondary | ICD-10-CM | POA: Insufficient documentation

## 2019-07-29 HISTORY — DX: Presence of spectacles and contact lenses: Z97.3

## 2019-07-29 HISTORY — DX: Headache, unspecified: R51.9

## 2019-07-29 HISTORY — DX: Gastro-esophageal reflux disease without esophagitis: K21.9

## 2019-07-29 HISTORY — DX: Anxiety disorder, unspecified: F41.9

## 2019-07-29 HISTORY — DX: Essential (primary) hypertension: I10

## 2019-07-29 HISTORY — DX: Personal history of urinary calculi: Z87.442

## 2019-07-29 HISTORY — DX: Chronic cholecystitis: K81.1

## 2019-07-29 LAB — COMPREHENSIVE METABOLIC PANEL
ALT: 15 U/L (ref 0–44)
AST: 16 U/L (ref 15–41)
Albumin: 3.8 g/dL (ref 3.5–5.0)
Alkaline Phosphatase: 47 U/L (ref 38–126)
Anion gap: 9 (ref 5–15)
BUN: 9 mg/dL (ref 6–20)
CO2: 20 mmol/L — ABNORMAL LOW (ref 22–32)
Calcium: 8.8 mg/dL — ABNORMAL LOW (ref 8.9–10.3)
Chloride: 109 mmol/L (ref 98–111)
Creatinine, Ser: 0.62 mg/dL (ref 0.44–1.00)
GFR calc Af Amer: >60 mL/min (ref >60)
GFR calc non Af Amer: >60 mL/min (ref >60)
Glucose, Bld: 91 mg/dL (ref 70–99)
Potassium: 3.5 mmol/L (ref 3.5–5.1)
Sodium: 138 mmol/L (ref 135–145)
Total Bilirubin: 0.5 mg/dL (ref 0.3–1.2)
Total Protein: 7.2 g/dL (ref 6.5–8.1)

## 2019-07-29 LAB — CBC WITH DIFFERENTIAL/PLATELET
Abs Immature Granulocytes: 0.03 10*3/uL (ref 0.00–0.07)
Basophils Absolute: 0 10*3/uL (ref 0.0–0.1)
Basophils Relative: 1 %
Eosinophils Absolute: 0.1 10*3/uL (ref 0.0–0.5)
Eosinophils Relative: 2 %
HCT: 40.9 % (ref 36.0–46.0)
Hemoglobin: 14.1 g/dL (ref 12.0–15.0)
Immature Granulocytes: 0 %
Lymphocytes Relative: 35 %
Lymphs Abs: 2.5 10*3/uL (ref 0.7–4.0)
MCH: 32.2 pg (ref 26.0–34.0)
MCHC: 34.5 g/dL (ref 30.0–36.0)
MCV: 93.4 fL (ref 80.0–100.0)
Monocytes Absolute: 0.5 10*3/uL (ref 0.1–1.0)
Monocytes Relative: 7 %
Neutro Abs: 4.1 10*3/uL (ref 1.7–7.7)
Neutrophils Relative %: 55 %
Platelets: 245 10*3/uL (ref 150–400)
RBC: 4.38 MIL/uL (ref 3.87–5.11)
RDW: 12 % (ref 11.5–15.5)
WBC: 7.3 10*3/uL (ref 4.0–10.5)
nRBC: 0 % (ref 0.0–0.2)

## 2019-07-29 LAB — SARS CORONAVIRUS 2 (TAT 6-24 HRS): SARS Coronavirus 2: NEGATIVE

## 2019-07-29 NOTE — Progress Notes (Signed)
Pt denies SOB, chest pain, and being under the care of a cardiologist. Pt stated that PCP is Vicie Mutters, [place]. Pt denies having a stress test, echo and cardiac cath.  Pt denies having a chest x ray in the last year. Pt denies recent labs. Pt reminded to quarantine. Pt verbalized understanding of all pre-op instructions.

## 2019-08-01 ENCOUNTER — Ambulatory Visit (HOSPITAL_COMMUNITY)
Admission: RE | Admit: 2019-08-01 | Discharge: 2019-08-01 | Disposition: A | Discharge status: Home / Self Care | Payer: 59 | Attending: General Surgery | Admitting: General Surgery

## 2019-08-01 ENCOUNTER — Encounter (HOSPITAL_COMMUNITY): Payer: Self-pay | Admitting: General Surgery

## 2019-08-01 ENCOUNTER — Ambulatory Visit (HOSPITAL_COMMUNITY): Payer: 59 | Admitting: Anesthesiology

## 2019-08-01 ENCOUNTER — Encounter (HOSPITAL_COMMUNITY)
Admission: RE | Disposition: A | Discharge status: Home / Self Care | Payer: Self-pay | Source: Home / Self Care | Attending: General Surgery

## 2019-08-01 ENCOUNTER — Other Ambulatory Visit: Payer: Self-pay

## 2019-08-01 DIAGNOSIS — K8010 Calculus of gallbladder with chronic cholecystitis without obstruction: Secondary | ICD-10-CM | POA: Diagnosis present

## 2019-08-01 DIAGNOSIS — K219 Gastro-esophageal reflux disease without esophagitis: Secondary | ICD-10-CM | POA: Insufficient documentation

## 2019-08-01 DIAGNOSIS — F419 Anxiety disorder, unspecified: Secondary | ICD-10-CM | POA: Insufficient documentation

## 2019-08-01 DIAGNOSIS — G4733 Obstructive sleep apnea (adult) (pediatric): Secondary | ICD-10-CM | POA: Insufficient documentation

## 2019-08-01 DIAGNOSIS — K429 Umbilical hernia without obstruction or gangrene: Secondary | ICD-10-CM | POA: Insufficient documentation

## 2019-08-01 DIAGNOSIS — Z79899 Other long term (current) drug therapy: Secondary | ICD-10-CM | POA: Insufficient documentation

## 2019-08-01 DIAGNOSIS — K5730 Diverticulosis of large intestine without perforation or abscess without bleeding: Secondary | ICD-10-CM | POA: Insufficient documentation

## 2019-08-01 DIAGNOSIS — Z8249 Family history of ischemic heart disease and other diseases of the circulatory system: Secondary | ICD-10-CM | POA: Insufficient documentation

## 2019-08-01 DIAGNOSIS — E7800 Pure hypercholesterolemia, unspecified: Secondary | ICD-10-CM | POA: Diagnosis not present

## 2019-08-01 DIAGNOSIS — K760 Fatty (change of) liver, not elsewhere classified: Secondary | ICD-10-CM | POA: Diagnosis not present

## 2019-08-01 HISTORY — PX: CHOLECYSTECTOMY: SHX55

## 2019-08-01 LAB — POCT PREGNANCY, URINE: Preg Test, Ur: NEGATIVE

## 2019-08-01 SURGERY — LAPAROSCOPIC CHOLECYSTECTOMY
Anesthesia: General | Site: Abdomen

## 2019-08-01 MED ORDER — ONDANSETRON HCL 4 MG/2ML IJ SOLN
INTRAMUSCULAR | Status: DC | PRN
  Administered 2019-08-01: 4 mg via INTRAVENOUS

## 2019-08-01 MED ORDER — SODIUM CHLORIDE 0.9 % IR SOLN
Status: DC | PRN
  Administered 2019-08-01: 1

## 2019-08-01 MED ORDER — LACTATED RINGERS IV SOLN: INTRAVENOUS | Status: DC

## 2019-08-01 MED ORDER — STERILE WATER FOR IRRIGATION IR SOLN
Status: DC | PRN
  Administered 2019-08-01: 1000 mL

## 2019-08-01 MED ORDER — OXYCODONE HCL 5 MG PO TABS
ORAL_TABLET | ORAL | Status: AC
  Filled 2019-08-01: qty 1

## 2019-08-01 MED ORDER — ROCURONIUM BROMIDE 10 MG/ML (PF) SYRINGE
PREFILLED_SYRINGE | INTRAVENOUS | Status: DC | PRN
  Administered 2019-08-01: 60 mg via INTRAVENOUS

## 2019-08-01 MED ORDER — ACETAMINOPHEN 500 MG PO TABS
1000.0000 mg | ORAL_TABLET | Freq: Once | ORAL | Status: AC
  Administered 2019-08-01: 1000 mg via ORAL
  Filled 2019-08-01: qty 2

## 2019-08-01 MED ORDER — FENTANYL CITRATE (PF) 250 MCG/5ML IJ SOLN
INTRAMUSCULAR | Status: AC
  Filled 2019-08-01: qty 5

## 2019-08-01 MED ORDER — CEFAZOLIN SODIUM-DEXTROSE 2-4 GM/100ML-% IV SOLN
2.0000 g | INTRAVENOUS | Status: AC
  Administered 2019-08-01: 10:00:00 2 g via INTRAVENOUS
  Filled 2019-08-01: qty 100

## 2019-08-01 MED ORDER — OXYCODONE HCL 5 MG PO TABS: 5.0000 mg | ORAL_TABLET | Freq: Four times a day (QID) | ORAL | 0 refills | Status: DC | PRN

## 2019-08-01 MED ORDER — OXYCODONE HCL 5 MG/5ML PO SOLN: 5.0000 mg | Freq: Once | ORAL | Status: AC | PRN

## 2019-08-01 MED ORDER — LIDOCAINE 2% (20 MG/ML) 5 ML SYRINGE
INTRAMUSCULAR | Status: AC
  Filled 2019-08-01: qty 5

## 2019-08-01 MED ORDER — MIDAZOLAM HCL 5 MG/5ML IJ SOLN
INTRAMUSCULAR | Status: DC | PRN
  Administered 2019-08-01: 2 mg via INTRAVENOUS

## 2019-08-01 MED ORDER — PHENYLEPHRINE 40 MCG/ML (10ML) SYRINGE FOR IV PUSH (FOR BLOOD PRESSURE SUPPORT)
PREFILLED_SYRINGE | INTRAVENOUS | Status: DC | PRN
  Administered 2019-08-01: 120 ug via INTRAVENOUS

## 2019-08-01 MED ORDER — PROPOFOL 10 MG/ML IV BOLUS
INTRAVENOUS | Status: AC
  Filled 2019-08-01: qty 20

## 2019-08-01 MED ORDER — PROMETHAZINE HCL 25 MG/ML IJ SOLN
6.2500 mg | INTRAMUSCULAR | Status: AC | PRN
  Administered 2019-08-01 (×2): 6.25 mg via INTRAVENOUS

## 2019-08-01 MED ORDER — KETOROLAC TROMETHAMINE 30 MG/ML IJ SOLN: 30.0000 mg | Freq: Once | INTRAMUSCULAR | Status: DC | PRN

## 2019-08-01 MED ORDER — DEXAMETHASONE SODIUM PHOSPHATE 10 MG/ML IJ SOLN
INTRAMUSCULAR | Status: AC
  Filled 2019-08-01: qty 1

## 2019-08-01 MED ORDER — PROPOFOL 10 MG/ML IV BOLUS
INTRAVENOUS | Status: DC | PRN
  Administered 2019-08-01: 200 mg via INTRAVENOUS

## 2019-08-01 MED ORDER — SUGAMMADEX SODIUM 200 MG/2ML IV SOLN
INTRAVENOUS | Status: DC | PRN
  Administered 2019-08-01 (×4): 100 mg via INTRAVENOUS

## 2019-08-01 MED ORDER — ONDANSETRON HCL 4 MG/2ML IJ SOLN
INTRAMUSCULAR | Status: AC
  Filled 2019-08-01: qty 2

## 2019-08-01 MED ORDER — DEXAMETHASONE SODIUM PHOSPHATE 10 MG/ML IJ SOLN
INTRAMUSCULAR | Status: DC | PRN
  Administered 2019-08-01: 5 mg via INTRAVENOUS

## 2019-08-01 MED ORDER — SCOPOLAMINE 1 MG/3DAYS TD PT72
1.0000 | MEDICATED_PATCH | TRANSDERMAL | Status: DC
  Administered 2019-08-01: 1.5 mg via TRANSDERMAL
  Filled 2019-08-01: qty 1

## 2019-08-01 MED ORDER — DIPHENHYDRAMINE HCL 50 MG/ML IJ SOLN
INTRAMUSCULAR | Status: AC
  Filled 2019-08-01: qty 1

## 2019-08-01 MED ORDER — BUPIVACAINE HCL (PF) 0.25 % IJ SOLN
INTRAMUSCULAR | Status: AC
  Filled 2019-08-01: qty 30

## 2019-08-01 MED ORDER — PROMETHAZINE HCL 25 MG/ML IJ SOLN
INTRAMUSCULAR | Status: AC
  Filled 2019-08-01: qty 1

## 2019-08-01 MED ORDER — ROCURONIUM BROMIDE 10 MG/ML (PF) SYRINGE: PREFILLED_SYRINGE | INTRAVENOUS | Status: DC | PRN

## 2019-08-01 MED ORDER — HYDROMORPHONE HCL 1 MG/ML IJ SOLN
0.2500 mg | INTRAMUSCULAR | Status: DC | PRN
  Administered 2019-08-01: 0.5 mg via INTRAVENOUS

## 2019-08-01 MED ORDER — LIDOCAINE-EPINEPHRINE 1 %-1:100000 IJ SOLN
INTRAMUSCULAR | Status: AC
  Filled 2019-08-01: qty 1

## 2019-08-01 MED ORDER — MEPERIDINE HCL 25 MG/ML IJ SOLN: 6.2500 mg | INTRAMUSCULAR | Status: DC | PRN

## 2019-08-01 MED ORDER — LIDOCAINE HCL 1 % IJ SOLN
INTRAMUSCULAR | Status: DC | PRN
  Administered 2019-08-01: 21 mL via INTRAMUSCULAR

## 2019-08-01 MED ORDER — LIDOCAINE 2% (20 MG/ML) 5 ML SYRINGE
INTRAMUSCULAR | Status: DC | PRN
  Administered 2019-08-01: 60 mg via INTRAVENOUS

## 2019-08-01 MED ORDER — OXYCODONE HCL 5 MG PO TABS
5.0000 mg | ORAL_TABLET | Freq: Once | ORAL | Status: AC | PRN
  Administered 2019-08-01: 5 mg via ORAL

## 2019-08-01 MED ORDER — ROCURONIUM BROMIDE 10 MG/ML (PF) SYRINGE
PREFILLED_SYRINGE | INTRAVENOUS | Status: AC
  Filled 2019-08-01: qty 10

## 2019-08-01 MED ORDER — 0.9 % SODIUM CHLORIDE (POUR BTL) OPTIME
TOPICAL | Status: DC | PRN
  Administered 2019-08-01: 1000 mL

## 2019-08-01 MED ORDER — MIDAZOLAM HCL 2 MG/2ML IJ SOLN
INTRAMUSCULAR | Status: AC
  Filled 2019-08-01: qty 2

## 2019-08-01 MED ORDER — FENTANYL CITRATE (PF) 250 MCG/5ML IJ SOLN
INTRAMUSCULAR | Status: DC | PRN
  Administered 2019-08-01: 100 ug via INTRAVENOUS
  Administered 2019-08-01: 50 ug via INTRAVENOUS

## 2019-08-01 MED ORDER — PHENYLEPHRINE 40 MCG/ML (10ML) SYRINGE FOR IV PUSH (FOR BLOOD PRESSURE SUPPORT)
PREFILLED_SYRINGE | INTRAVENOUS | Status: AC
  Filled 2019-08-01: qty 10

## 2019-08-01 MED ORDER — DIPHENHYDRAMINE HCL 50 MG/ML IJ SOLN
INTRAMUSCULAR | Status: DC | PRN
  Administered 2019-08-01: 12.5 mg via INTRAVENOUS

## 2019-08-01 MED ORDER — PROPOFOL 500 MG/50ML IV EMUL
INTRAVENOUS | Status: DC | PRN
  Administered 2019-08-01: 25 ug/kg/min via INTRAVENOUS

## 2019-08-01 MED ORDER — HYDROMORPHONE HCL 1 MG/ML IJ SOLN
INTRAMUSCULAR | Status: AC
  Filled 2019-08-01: qty 1

## 2019-08-01 SURGICAL SUPPLY — 45 items
APPLIER CLIP ROT 10 11.4 M/L (STAPLE) ×4
BLADE CLIPPER SURG (BLADE) IMPLANT
CANISTER SUCT 3000ML PPV (MISCELLANEOUS) ×4 IMPLANT
CHLORAPREP W/TINT 26 (MISCELLANEOUS) ×4 IMPLANT
CLIP APPLIE ROT 10 11.4 M/L (STAPLE) ×2 IMPLANT
CLIP VESOLOCK XL 6/CT (CLIP) IMPLANT
COVER MAYO STAND STRL (DRAPES) ×4 IMPLANT
COVER SURGICAL LIGHT HANDLE (MISCELLANEOUS) ×4 IMPLANT
COVER WAND RF STERILE (DRAPES) ×4 IMPLANT
DERMABOND ADVANCED (GAUZE/BANDAGES/DRESSINGS) ×2
DERMABOND ADVANCED .7 DNX12 (GAUZE/BANDAGES/DRESSINGS) ×2 IMPLANT
DRAPE C-ARM 42X120 X-RAY (DRAPES) ×4 IMPLANT
DRAPE WARM FLUID 44X44 (DRAPES) IMPLANT
ELECT REM PT RETURN 9FT ADLT (ELECTROSURGICAL) ×4
ELECTRODE REM PT RTRN 9FT ADLT (ELECTROSURGICAL) ×2 IMPLANT
FILTER SMOKE EVAC LAPAROSHD (FILTER) IMPLANT
GLOVE BIO SURGEON STRL SZ 6 (GLOVE) ×4 IMPLANT
GLOVE INDICATOR 6.5 STRL GRN (GLOVE) ×4 IMPLANT
GOWN STRL REUS W/ TWL LRG LVL3 (GOWN DISPOSABLE) ×4 IMPLANT
GOWN STRL REUS W/TWL 2XL LVL3 (GOWN DISPOSABLE) ×4 IMPLANT
GOWN STRL REUS W/TWL LRG LVL3 (GOWN DISPOSABLE) ×8
KIT BASIN OR (CUSTOM PROCEDURE TRAY) ×4 IMPLANT
KIT TURNOVER KIT B (KITS) ×4 IMPLANT
L-HOOK LAP DISP 36CM (ELECTROSURGICAL) ×4
LHOOK LAP DISP 36CM (ELECTROSURGICAL) ×2 IMPLANT
NS IRRIG 1000ML POUR BTL (IV SOLUTION) ×4 IMPLANT
PAD ARMBOARD 7.5X6 YLW CONV (MISCELLANEOUS) ×4 IMPLANT
PENCIL BUTTON HOLSTER BLD 10FT (ELECTRODE) ×4 IMPLANT
POUCH RETRIEVAL ECOSAC 10 (ENDOMECHANICALS) ×2 IMPLANT
POUCH RETRIEVAL ECOSAC 10MM (ENDOMECHANICALS) ×4
SCISSORS LAP 5X35 DISP (ENDOMECHANICALS) ×4 IMPLANT
SET CHOLANGIOGRAPH 5 50 .035 (SET/KITS/TRAYS/PACK) IMPLANT
SET IRRIG TUBING LAPAROSCOPIC (IRRIGATION / IRRIGATOR) ×4 IMPLANT
SET TUBE SMOKE EVAC HIGH FLOW (TUBING) ×4 IMPLANT
SLEEVE ENDOPATH XCEL 5M (ENDOMECHANICALS) ×4 IMPLANT
SPECIMEN JAR SMALL (MISCELLANEOUS) ×4 IMPLANT
SUT MNCRL AB 4-0 PS2 18 (SUTURE) ×4 IMPLANT
TOWEL GREEN STERILE (TOWEL DISPOSABLE) ×4 IMPLANT
TOWEL GREEN STERILE FF (TOWEL DISPOSABLE) ×4 IMPLANT
TRAY LAPAROSCOPIC MC (CUSTOM PROCEDURE TRAY) ×4 IMPLANT
TROCAR XCEL BLUNT TIP 100MML (ENDOMECHANICALS) ×4 IMPLANT
TROCAR XCEL NON-BLD 11X100MML (ENDOMECHANICALS) ×4 IMPLANT
TROCAR XCEL NON-BLD 5MMX100MML (ENDOMECHANICALS) ×4 IMPLANT
WARMER LAPAROSCOPE (MISCELLANEOUS) ×4 IMPLANT
WATER STERILE IRR 1000ML POUR (IV SOLUTION) ×4 IMPLANT

## 2019-08-01 NOTE — Transfer of Care (Signed)
Immediate Anesthesia Transfer of Care Note  Patient: Emily Nolan  Procedure(s) Performed: LAPAROSCOPIC CHOLECYSTECTOMY (N/A Abdomen)  Patient Location: PACU  Anesthesia Type:General  Level of Consciousness: awake, alert  and drowsy  Airway & Oxygen Therapy: Patient Spontanous Breathing and Patient connected to face mask oxygen  Post-op Assessment: Report given to RN and Post -op Vital signs reviewed and stable  Post vital signs: Reviewed and stable  Last Vitals:  Vitals Value Taken Time  BP 161/82 08/01/19 1054  Temp    Pulse 71 08/01/19 1055  Resp 15 08/01/19 1055  SpO2 96 % 08/01/19 1055  Vitals shown include unvalidated device data.  Last Pain:  Vitals:   08/01/19 0733  PainSc: 0-No pain      Patients Stated Pain Goal: 4 ([id number])  Complications: No apparent anesthesia complications

## 2019-08-01 NOTE — Anesthesia Preprocedure Evaluation (Addendum)
Anesthesia Evaluation  Patient identified by MRN, date of birth, ID band Patient awake    Reviewed: Allergy & Precautions, NPO status , Patient's Chart, lab work & pertinent test results, reviewed documented beta blocker date and time   History of Anesthesia Complications (+) PONV and history of anesthetic complications  Airway Mallampati: II  TM Distance: >3 FB Neck ROM: Full    Dental  (+) Teeth Intact, Dental Advisory Given   Pulmonary sleep apnea ,  Mild OSA on sleep study in 2016- uses dental device at night    breath sounds clear to auscultation       Cardiovascular hypertension, Pt. on home beta blockers and Pt. on medications  Rhythm:Regular Rate:Normal     Neuro/Psych  Headaches, PSYCHIATRIC DISORDERS Anxiety    GI/Hepatic Neg liver ROS, GERD  Medicated and Controlled,Chronic cholecystitis Diverticulosis    Endo/Other  negative endocrine ROS  Renal/GU negative Renal ROS  Female GU complaint Pelvic pain    Musculoskeletal negative musculoskeletal ROS (+)   Abdominal Normal abdominal exam  (+)   Peds  Hematology negative hematology ROS (+)   Anesthesia Other Findings   Reproductive/Obstetrics negative OB ROS                            Anesthesia Physical Anesthesia Plan  ASA: II  Anesthesia Plan: General   Post-op Pain Management:    Induction: Intravenous  PONV Risk Score and Plan: 4 or greater and Ondansetron, Dexamethasone, Treatment may vary due to age or medical condition, Midazolam and Scopolamine patch - Pre-op  Airway Management Planned: Oral ETT  Additional Equipment: None  Intra-op Plan:   Post-operative Plan: Extubation in OR  Informed Consent: I have reviewed the patients History and Physical, chart, labs and discussed the procedure including the risks, benefits and alternatives for the proposed anesthesia with the patient or authorized representative  who has indicated his/her understanding and acceptance.     Dental advisory given  Plan Discussed with: CRNA  Anesthesia Plan Comments:         Anesthesia Quick Evaluation

## 2019-08-01 NOTE — Discharge Instructions (Addendum)
[place] Office Phone Number [phone]   POST OP INSTRUCTIONS  Always review your discharge instruction sheet given to you by the facility where your surgery was performed.  IF YOU HAVE DISABILITY OR FAMILY LEAVE FORMS, YOU MUST BRING THEM TO THE OFFICE FOR PROCESSING.  DO NOT GIVE THEM TO YOUR DOCTOR.  1. A prescription for pain medication may be given to you upon discharge.  Take your pain medication as prescribed, if needed.  If narcotic pain medicine is not needed, then you may take acetaminophen (Tylenol) or ibuprofen (Advil) as needed. 2. Take your usually prescribed medications unless otherwise directed 3. If you need a refill on your pain medication, please contact your pharmacy.  They will contact our office to request authorization.  Prescriptions will not be filled after 5pm or on week-ends. 4. You should eat very light the first 24 hours after surgery, such as soup, crackers, pudding, etc.  Resume your normal diet the day after surgery 5. It is common to experience some constipation if taking pain medication after surgery.  Increasing fluid intake and taking a stool softener will usually help or prevent this problem from occurring.  A mild laxative (Milk of Magnesia or Miralax) should be taken according to package directions if there are no bowel movements after 48 hours. 6. You may shower in 48 hours.  The surgical glue will flake off in 2-3 weeks.   7. ACTIVITIES:  No strenuous activity or heavy lifting for 2 weeks.   a. You may drive when you no longer are taking prescription pain medication, you can comfortably wear a seatbelt, and you can safely maneuver your car and apply brakes. b. RETURN TO WORK:  __________as tolerated._______________ Dennis Bast should see your doctor in the office for a follow-up appointment approximately three-four weeks after your surgery.    WHEN TO CALL YOUR DOCTOR: 1. Fever over 101.0 2. Nausea and/or vomiting. 3. Extreme swelling or  bruising. 4. Continued bleeding from incision. 5. Increased pain, redness, or drainage from the incision.  The clinic staff is available to answer your questions during regular business hours.  Please don't hesitate to call and ask to speak to one of the nurses for clinical concerns.  If you have a medical emergency, go to the nearest emergency room or call 911.  A surgeon from [facility] is always on call at the hospital.  For further questions, please visit [email or web address]

## 2019-08-01 NOTE — Anesthesia Procedure Notes (Signed)
Procedure Name: Intubation Date/Time: 08/01/2019 9:38 AM Performed by: Trinna Post., CRNA Pre-anesthesia Checklist: Patient identified, Emergency Drugs available, Suction available, Patient being monitored and Timeout performed Patient Re-evaluated:Patient Re-evaluated prior to induction Oxygen Delivery Method: Circle system utilized Preoxygenation: Pre-oxygenation with 100% oxygen Induction Type: IV induction Ventilation: Mask ventilation without difficulty Laryngoscope Size: Mac and 3 Grade View: Grade II Tube size: 7.0 mm Number of attempts: 1 Airway Equipment and Method: Stylet Placement Confirmation: ETT inserted through vocal cords under direct vision,  positive ETCO2 and breath sounds checked- equal and bilateral Secured at: 22 cm Tube secured with: Tape Dental Injury: Teeth and Oropharynx as per pre-operative assessment

## 2019-08-01 NOTE — Anesthesia Postprocedure Evaluation (Signed)
Anesthesia Post Note  Patient: Mindie Herlong  Procedure(s) Performed: LAPAROSCOPIC CHOLECYSTECTOMY (N/A Abdomen)     Patient location during evaluation: PACU Anesthesia Type: General Level of consciousness: awake and alert, oriented and patient cooperative Pain management: pain level controlled Vital Signs Assessment: post-procedure vital signs reviewed and stable Respiratory status: spontaneous breathing, nonlabored ventilation and respiratory function stable Cardiovascular status: blood pressure returned to baseline and stable Postop Assessment: no apparent nausea or vomiting Anesthetic complications: no    Last Vitals:  Vitals:   08/01/19 1140 08/01/19 1155  BP: 123/84 123/79  Pulse: 63 62  Resp: 11 16  Temp:    SpO2: 96% 97%    Last Pain:  Vitals:   08/01/19 1125  PainSc: [place]

## 2019-08-01 NOTE — H&P (Signed)
Emily Nolan Location: [facility] Surgery Patient #: [id number] DOB: July 12, 1975 Married / Language: English / Race: White Female   History of Present Illness  The patient is a 44 year old female who presents for evaluation of gall stones. 44 yo F presents with abdominal pain for around 3 months. Her doc initially felt it was due to the significant ibuprofen use so she stopped that and started pepcid. This pain was upper central, but slightly more to the left. She also had some nausea. She then developed severe pelvic pain in november and a CT was done to look for kidney stones. This showed gallstones and inflammation of her bladder. The lower abdominal pain stopped, but she continued to have the upper abdominal pain, post prandial nausea/bloating, and need to have BM quickly after eating. She denies fever/chills/jaundice/severe upper pain. The pain is more nagging at this point.   Of note, her mother had cholecystectomy in november. She was quite sick pre op.    Abd u/s 04/30/2019 IMPRESSION: Cholelithiasis with presence of a sonographic Murphy sign, although gallbladder wall thickening and pericholecystic fluid are not identified.  Early acute cholecystitis not excluded.  Probable fatty infiltration of liver as above.  Nonspecific 9 mm hypoechoic hepatic nodule grossly stable in size since 02/14/2019  CT abd/pelvis 02/14/2019 IMPRESSION: 1. There is thickening of the urinary bladder wall, indicative of a degree of cystitis. No evident renal or ureteral calculus. No hydronephrosis.  2. Cholelithiasis. Gallbladder wall does not appear appreciably thickened by CT.  3. Occasional sigmoid diverticula without diverticulitis. No bowel obstruction. No abscess in the abdomen or pelvis. Appendix appears normal.  4. Hepatic steatosis.  5. Small umbilical hernia containing only fat.  Labs 04/17/2019 normal CMET CBC normal    Diagnostic Studies  History Colonoscopy  never Mammogram  within last year Pap Smear  1-5 years ago  Allergies  Chlorhexidine *PHARMACEUTICAL ADJUVANTS*  Azithromycin *CHEMICALS*  Nausea. Codeine Phosphate *ANALGESICS - OPIOID*  Allergies Reconciled   Medication History ALPRAZolam (0.25MG  Tablet, Oral) Active. Metoprolol Succinate ER (100MG  Tablet ER 24HR, Oral) Active. Topiramate (50MG  Tablet, Oral) Active. Celecoxib (200MG  Capsule, Oral) Active. Meclizine HCl (25MG  Tablet, Oral) Active. Metronidazole (External) Specific strength unknown - Active. Pepcid (Oral) Specific strength unknown - Active. Medications Reconciled  Social History  Alcohol use  Occasional alcohol use. Caffeine use  Tea. No drug use  Tobacco use  Never smoker.  Family History Alcohol Abuse  Father. Arthritis  Mother. Cancer  Father. Colon Polyps  Father. Depression  Daughter. Heart Disease  Father. Heart disease in female family member before age 21  Hypertension  Brother, Father, Mother.  Pregnancy / Birth History Age at menarche  6 years. Contraceptive History  Intrauterine device, Oral contraceptives. Gravida  7 Maternal age  <15 20-20 Para  3 Regular periods   Other Problems Anxiety Disorder  Cholelithiasis  Gastroesophageal Reflux Disease  High blood pressure  Hypercholesterolemia  Kidney Stone  Migraine Headache  Sleep Apnea  Transfusion history  Umbilical Hernia Repair     Review of Systems Skin Not Present- Change in Wart/Mole, Dryness, Hives, Jaundice, New Lesions, Non-Healing Wounds, Rash and Ulcer. HEENT Not Present- Earache, Hearing Loss, Hoarseness, Nose Bleed, Oral Ulcers, Ringing in the Ears, Seasonal Allergies, Sinus Pain, Sore Throat, Visual Disturbances, Wears glasses/contact lenses and Yellow Eyes. Respiratory Present- Snoring. Not Present- Bloody sputum, Chronic Cough, Difficulty Breathing and Wheezing. Breast Not Present- Breast Mass,  Breast Pain, Nipple Discharge and Skin Changes. Cardiovascular Not Present- Chest Pain, Difficulty  Breathing Lying Down, Leg Cramps, Palpitations, Rapid Heart Rate, Shortness of Breath and Swelling of Extremities. Gastrointestinal Present- Abdominal Pain, Bloating and Indigestion. Not Present- Bloody Stool, Change in Bowel Habits, Chronic diarrhea, Constipation, Difficulty Swallowing, Excessive gas, Gets full quickly at meals, Hemorrhoids, Nausea, Rectal Pain and Vomiting. Female Genitourinary Present- Urgency. Not Present- Frequency, Nocturia, Painful Urination and Pelvic Pain. Neurological Present- Headaches. Not Present- Decreased Memory, Fainting, Numbness, Seizures, Tingling, Tremor, Trouble walking and Weakness. Psychiatric Present- Anxiety. Not Present- Bipolar, Change in Sleep Pattern, Depression, Fearful and Frequent crying. Hematology Not Present- Blood Thinners, Easy Bruising, Excessive bleeding, Gland problems, HIV and Persistent Infections.  Vitals Weight: 163.38 lb Height: 63in Body Surface Area: 1.77 m Body Mass Index: 28.94 kg/m  Temp.: 69F  Pulse: 88 (Regular)  BP: 132/74 (Sitting, Left Arm, Standard)       Physical Exam  General Mental Status-Alert. General Appearance-Consistent with stated age. Hydration-Well hydrated. Voice-Normal.  Head and Neck Head-normocephalic, atraumatic with no lesions or palpable masses. Trachea-midline. Thyroid Gland Characteristics - normal size and consistency.  Eye Eyeball - Bilateral-Extraocular movements intact. Sclera/Conjunctiva - Bilateral-No scleral icterus.  Chest and Lung Exam Chest and lung exam reveals -quiet, even and easy respiratory effort with no use of accessory muscles and on auscultation, normal breath sounds, no adventitious sounds and normal vocal resonance. Inspection Chest Wall - Normal. Back - normal.  Cardiovascular Cardiovascular examination reveals -normal heart  sounds, regular rate and rhythm with no murmurs and normal pedal pulses bilaterally.  Abdomen Inspection Inspection of the abdomen reveals - No Hernias. Palpation/Percussion Palpation and Percussion of the abdomen reveal - Soft, No Rebound tenderness, No Rigidity (guarding) and No hepatosplenomegaly. Auscultation Auscultation of the abdomen reveals - Bowel sounds normal. Note: mild right upper quandrant tenderness. tiny umbilical hernia in prior incision.   Neurologic Neurologic evaluation reveals -alert and oriented x 3 with no impairment of recent or remote memory. Mental Status-Normal.  Musculoskeletal Global Assessment -Note: no gross deformities.  Normal Exam - Left-Upper Extremity Strength Normal and Lower Extremity Strength Normal. Normal Exam - Right-Upper Extremity Strength Normal and Lower Extremity Strength Normal.  Lymphatic Head & Neck  General Head & Neck Lymphatics: Bilateral - Description - Normal. Axillary  General Axillary Region: Bilateral - Description - Normal. Tenderness - Non Tender. Femoral & Inguinal  Generalized Femoral & Inguinal Lymphatics: Bilateral - Description - No Generalized lymphadenopathy.    Assessment & Plan CHRONIC CHOLECYSTITIS WITH CALCULUS (K80.10) Impression: Pt has mild chronic cholecystitis wtih stones. She has not had any pain c/w travel of stones. I discussed that she will need her gallbladder out, but currently timing is more elective.  The surgical procedure was described to the patient in detail. The patient was given educational material. I discussed the incision type and location, the location of the gallbladder, the anatomy of the bile ducts and arteries, and the typical progression of surgery. I discussed the possibility of converting to an open operation. I advised of the risks of bleeding, infection, damage to other structures (such as the bile duct, intestine or liver), bile leak, need for other procedures or  surgeries, and post op diarrhea/constipation. We discussed the risk of blood clot. We discussed the recovery period and post operative restrictions. The patient was advised against taking blood thinners the week before surgery. Current Plans Call when you are ready for scheduling.   We try to make accommodations for patient's preferences in scheduling surgery, but sometimes the OR schedule or the surgeon's schedule prevents [place] from  making those accommodations.  If you have not heard from our office [phone]) in 5 working days, call the office and ask for your surgeon's nurse.  If you have other questions about your diagnosis, plan, or surgery, call the office and ask for your surgeon's nurse.  Pt Education - Pamphlet Given - Laparoscopic Gallbladder Surgery: discussed with patient and provided information. Pt Education - Laparoscopic Cholecystectomy: gallbladder

## 2019-08-01 NOTE — Interval H&P Note (Signed)
History and Physical Interval Note:  08/01/2019 9:12 AM  Emily Nolan  has presented today for surgery, with the diagnosis of CHRONIC CHOLECYSTITIS.  The various methods of treatment have been discussed with the patient and family. After consideration of risks, benefits and other options for treatment, the patient has consented to  Procedure(s): LAPAROSCOPIC CHOLECYSTECTOMY WITH POSSIBLE INTRAOPERATIVE CHOLANGIOGRAM (N/A) as a surgical intervention.  The patient's history has been reviewed, patient examined, no change in status, stable for surgery.  I have reviewed the patient's chart and labs.  Questions were answered to the patient's satisfaction.     Stark Klein

## 2019-08-01 NOTE — Op Note (Signed)
Laparoscopic Cholecystectomy  Indications: This patient presents with chronic calculous cholecystitis and will undergo laparoscopic cholecystectomy.  Pre-operative Diagnosis: chronic calculous cholecystitis  Post-operative Diagnosis: Same  Surgeon: Stark Klein   Assistants: Pryor Curia, RNFA  Anesthesia: General endotracheal anesthesia and local  ASA Class: 2  Procedure Details  The patient was seen again in the Holding Room. The risks, benefits, complications, treatment options, and expected outcomes were discussed with the patient. The possibilities of  bleeding, recurrent infection, damage to nearby structures, the need for additional procedures, failure to diagnose a condition, the possible need to convert to an open procedure, and creating a complication requiring transfusion or operation were discussed with the patient. The likelihood of improving the patient's symptoms with return to their baseline status is good.    The patient and/or family concurred with the proposed plan, giving informed consent. The site of surgery properly noted. The patient was taken to Operating Room, and the procedure verified as Laparoscopic Cholecystectomy with possible Intraoperative Cholangiogram. A Time Out was held and the above information confirmed.  Prior to the induction of general anesthesia, antibiotic prophylaxis was administered. General endotracheal anesthesia was then administered and tolerated well. After the induction, the abdomen was prepped with Chloraprep and draped in the sterile fashion. The patient was positioned in the supine position.  Local anesthetic agent was injected into the skin near the umbilicus and an incision made. We dissected down to the abdominal fascia with blunt dissection.  The fascia was incised vertically and we entered the peritoneal cavity bluntly.  A pursestring suture of 0-Vicryl was placed around the fascial opening.  The Hasson cannula was inserted and  secured with the stay suture.  Pneumoperitoneum was then created with CO2 and tolerated well without any adverse changes in the patient's vital signs. An 11-mm port was placed in the subxiphoid position.  Two 5-mm ports were placed in the right upper quadrant. All skin incisions were infiltrated with a local anesthetic agent before making the incision and placing the trocars.   We positioned the patient in reverse Trendelenburg, tilted slightly to the patient's left.  The gallbladder was identified, the fundus grasped and retracted cephalad. Adhesions were lysed bluntly and with the electrocautery where indicated, taking care not to injure any adjacent organs or viscus. The infundibulum was grasped and retracted laterally, exposing the peritoneum overlying the triangle of Calot. A small node of calot was visible.  The peritoneum was then divided and exposed in a blunt fashion.   The cystic duct and cystic artery were clearly identified and bluntly dissected circumferentially. A critical view of the cystic duct and cystic artery was obtained.  The cystic duct and cystic artery were ligated with a locking clip distally and two clips proximally.    The gallbladder was dissected from the liver bed in retrograde fashion with the electrocautery. The gallbladder was removed and placed in an Ecosac.  The gallbladder and bag were then removed through the umbilical port site.  The liver bed was irrigated and inspected. Hemostasis was achieved with the electrocautery. Copious irrigation was utilized and was repeatedly aspirated until clear.    We again inspected the right upper quadrant for hemostasis.  Pneumoperitoneum was released as we removed the trocars.   The pursestring suture was used to close the umbilical fascia.  4-0 Monocryl was used to close the skin.   The skin was cleaned and dry, and Dermabond was applied. The patient was then extubated and brought to the recovery room in stable  condition. Instrument,  sponge, and needle counts were correct at closure and at the conclusion of the case.   Findings: Chronic inflammation with stones.    Estimated Blood Loss: min         Drains: none          Specimens: Gallbladder to pathology       Complications: None; patient tolerated the procedure well.         Disposition: PACU - hemodynamically stable.         Condition: stable

## 2019-08-02 LAB — SURGICAL PATHOLOGY

## 2019-08-19 ENCOUNTER — Ambulatory Visit (INDEPENDENT_AMBULATORY_CARE_PROVIDER_SITE_OTHER): Payer: 59 | Admitting: Physician Assistant

## 2019-08-19 ENCOUNTER — Encounter: Payer: Self-pay | Admitting: Physician Assistant

## 2019-08-19 ENCOUNTER — Other Ambulatory Visit: Payer: Self-pay

## 2019-08-19 VITALS — BP 126/80 | HR 67 | Temp 97.6°F | Wt 154.0 lb

## 2019-08-19 DIAGNOSIS — R0989 Other specified symptoms and signs involving the circulatory and respiratory systems: Secondary | ICD-10-CM | POA: Diagnosis not present

## 2019-08-19 DIAGNOSIS — E559 Vitamin D deficiency, unspecified: Secondary | ICD-10-CM

## 2019-08-19 DIAGNOSIS — G4730 Sleep apnea, unspecified: Secondary | ICD-10-CM

## 2019-08-19 DIAGNOSIS — Z79899 Other long term (current) drug therapy: Secondary | ICD-10-CM

## 2019-08-19 DIAGNOSIS — E782 Mixed hyperlipidemia: Secondary | ICD-10-CM

## 2019-08-19 DIAGNOSIS — G4710 Hypersomnia, unspecified: Secondary | ICD-10-CM

## 2019-08-19 NOTE — Progress Notes (Signed)
FOLLOW UP  Assessment and Plan:   Labile hypertension -  DASH diet, exercise and monitor at home. Call if greater than 130/80.  -     metoprolol succinate (TOPROL XL) 25 MG 24 hr tablet; Take 1 tablet (25 mg total) by mouth at bedtime. -     CBC with Differential/Platelet -     COMPLETE METABOLIC PANEL WITH GFR -     TSH  Mixed hyperlipidemia check lipids decrease fatty foods increase activity.  -     Lipid panel  Anxiety Continue counseling, limit xanax -     metoprolol succinate (TOPROL XL) 25 MG 24 hr tablet; Take 1 tablet (25 mg total) by mouth at bedtime.  Sleep apnea with hypersomnolence continue mouth piece  Obesity (BMI 30.0-34.9) - follow up 3 months for progress monitoring - increase veggies, decrease carbs - long discussion about weight loss, diet, and exercise   Continue diet and meds as discussed. Further disposition pending results of labs. Over 30 minutes of exam, counseling, chart review, and critical decision making was performed  Future Appointments  Date Time Provider [place]  04/20/2020  3:00 PM Vicie Mutters, PA-C [id number] None     HPI 44 y.o. female  presents for 3 month follow up on hypertension, cholesterol, prediabetes, and vitamin D deficiency.   S/P choley 04/22, had diarrhea for about a week and a half, just now starting to feel better. No AB pain, no fever, chills.    Her blood pressure has not been controlled at home, has checked it several times at home and at appointments with it elevated, today their BP is BP: 126/80 .  She is trying to get mouth piece for her sleep apnea, now wearing it more. She has anxiety, she will take xanax 1/2 a few times a week.  BP Readings from Last 3 Encounters:  08/19/19 126/80  08/01/19 123/79  07/29/19 139/87   BMI is Body mass index is 27.28 kg/m., she is working on diet and exercise. She is 20 lbs down from last year with help of diet and topamax only on 50mg  at night and 5 lbs down from  the surgery. She states the topamax has helped her migraines as well.  Wt Readings from Last 10 Encounters:  08/19/19 154 lb (69.9 kg)  08/01/19 158 lb 15.2 oz (72.1 kg)  07/29/19 159 lb (72.1 kg)  04/17/19 172 lb 9.6 oz (78.3 kg)  02/12/19 173 lb 9.6 oz (78.7 kg)  01/22/19 174 lb (78.9 kg)  12/26/18 172 lb 12.8 oz (78.4 kg)  05/29/18 174 lb 9.6 oz (79.2 kg)  05/04/18 175 lb (79.4 kg)  03/19/18 173 lb 12.8 oz (78.8 kg)    She does not workout since her surgery, just active with the kids at this time and before the surgery yoga. She denies chest pain, shortness of breath, dizziness.   She  is not  on cholesterol medication and denies myalgias. Her cholesterol is at goal. The cholesterol last visit was:   Lab Results  Component Value Date   CHOL 160 04/17/2019   HDL 43 (L) 04/17/2019   LDLCALC 80 04/17/2019   TRIG 303 (H) 04/17/2019   CHOLHDL 3.7 04/17/2019   Last A1C in the office was:  Lab Results  Component Value Date   HGBA1C 5.1 04/17/2019   Patient is on Vitamin D supplement.   Lab Results  Component Value Date   VD25OH 15 (L) 04/17/2019      Current Medications:  Current Outpatient Medications on File Prior to Visit  Medication Sig  . ALPRAZolam (XANAX) 0.25 MG tablet TAKE 1/2 TO 1 TABLET ONCE OR TWICE DAILY AS NEEDED FOR ANXIETY ATTACKS (Patient taking differently: Take 0.125-0.25 mg by mouth 2 (two) times daily as needed for anxiety. )  . celecoxib (CELEBREX) 200 MG capsule Take 1 capsule Daily with Food for Pain & Inflammation (Patient taking differently: Take 200 mg by mouth daily as needed for moderate pain. )  . Cholecalciferol (VITAMIN D) 125 MCG (5000 UT) CAPS Take 5,000 Units by mouth daily.  . famotidine (PEPCID) 40 MG tablet Take 1 tablet Daily for Heartburn & Indigestion (Patient taking differently: Take 40 mg by mouth at bedtime. )  . meclizine (ANTIVERT) 25 MG tablet Take 1/2 to 1 tablet 2 to 3 x /day as needed for Dizziness / Vertigo (Patient taking  differently: Take 12.5-25 mg by mouth 3 (three) times daily as needed for dizziness. )  . metoprolol succinate (TOPROL-XL) 25 MG 24 hr tablet Take 1 tablet Daily for BP & Migraine Prevention (Patient taking differently: Take 25 mg by mouth at bedtime. )  . metroNIDAZOLE (METROCREAM) 0.75 % cream Apply topically 2 (two) times daily. (Patient taking differently: Apply 1 application topically 2 (two) times daily as needed (rosacea). )  . topiramate (TOPAMAX) 50 MG tablet Take 1 tablet 2 x /day for Dieting & Weight Loss (Patient taking differently: Take 50 mg by mouth at bedtime. )  . valACYclovir (VALTREX) 500 MG tablet Take 1 tablet (500 mg total) by mouth 2 (two) times daily. (Patient taking differently: Take 500 mg by mouth 2 (two) times daily as needed (outbreaks). )   No current facility-administered medications on file prior to visit.    Medical History:  Past Medical History:  Diagnosis Date  . Anxiety   . Chronic cholecystitis    with stones  . Diverticulosis of colon 02/14/2019  . Fatty liver 02/14/2019   Per CT 02/2019  . GERD (gastroesophageal reflux disease)   . Headache    migraines  . History of kidney stones   . HSV-2 infection   . Hypertension   . Mild obstructive sleep apnea    study 07-12-2014 in epic,  mild osa w/ hypopnea AHI 13/hr--- 08-25-2017 per pt uses dental device  . Pelvic pain in female   . PONV (postoperative nausea and vomiting)   . Wears glasses    Allergies:  Allergies  Allergen Reactions  . Codeine Nausea And Vomiting  . Azithromycin Nausea And Vomiting  . Chlorhexidine Gluconate Rash    CHG     Review of Systems:  Review of Systems  Constitutional: Negative.  Negative for chills, fever and malaise/fatigue.  HENT: Negative.  Negative for tinnitus.   Eyes: Negative.  Negative for blurred vision and double vision.  Respiratory: Negative.  Negative for shortness of breath.   Cardiovascular: Negative.  Negative for chest pain.  Gastrointestinal:  Negative.   Genitourinary: Negative.   Musculoskeletal: Negative.   Skin: Negative.   Neurological: Negative.  Negative for headaches.  Psychiatric/Behavioral: Negative.      Family history- Review and unchanged Social history- Review and unchanged Physical Exam: BP 126/80   Pulse 67   Temp 97.6 F (36.4 C)   Wt 154 lb (69.9 kg)   SpO2 99%   BMI 27.28 kg/m  Wt Readings from Last 3 Encounters:  08/19/19 154 lb (69.9 kg)  08/01/19 158 lb 15.2 oz (72.1 kg)  07/29/19 159 lb (72.1  kg)   General Appearance: Well nourished, in no apparent distress. Eyes: PERRLA, EOMs, conjunctiva no swelling or erythema Sinuses: No Frontal/maxillary tenderness ENT/Mouth: Ext aud canals clear, TMs without erythema, bulging. No erythema, swelling, or exudate on post pharynx.  Tonsils not swollen or erythematous. Hearing normal.  Neck: Supple, thyroid normal.  Respiratory: Respiratory effort normal, BS equal bilaterally without rales, rhonchi, wheezing or stridor.  Cardio: RRR with no MRGs. Brisk peripheral pulses without edema.  Abdomen: Soft, + BS,  Non tender, no guarding, rebound, hernias, masses. Lymphatics: Non tender without lymphadenopathy.  Musculoskeletal: Full ROM, 5/5 strength, Normal gait Skin: Warm, dry without rashes, lesions, ecchymosis.  Neuro: Cranial nerves intact. Normal muscle tone, no cerebellar symptoms. Psych: Awake and oriented X 3, normal affect, Insight and Judgment appropriate.    Vicie Mutters, PA-C 3:55 PM [facility]

## 2019-08-19 NOTE — Patient Instructions (Addendum)
Who Qualifies for Obesity Medications? Although everyone is hopeful for a fast and easy way to lose weight, nothing has been shown to replace a prudent, calorie-controlled diet along with behavior modification as a cornerstone for all obesity treatments.   The next tool that can be used to achieve weight-loss and health improvement is medication.   Pharmacotherapy may be offered to Individuals affected by obesity who have failed to achieve weight-loss through diet and exercise alone.  We have decided to try you on a weight loss medication here is some general information about this medication.    TOPIRAMATE Sometimes we will prescribe topiramate AND phentermine together to make Qsymia- separating the medications makes it cheaper.  Or topiramate can be used by itself for weight loss at night.   How to start it TOPAMAX Going to start you on topamax, start on 1/2 pill for 3-5 nights, can increase to a whole pill for 1-2 weeks.  Can increase to 1 pill at night and 1/2-1 pill in the AM.  This medication is good for weight loss, headaches, pain This medication can cause numbness, tingling and can cause brain fog- stop if you get these If you get blurry vision or have a history of glaucoma please stop this medication.  Let me know how you are doing with this.    Topiramate has been associated with an increased risk of birth defects- and should NOT be taken if pregnant or becoming pregnant.   Follow-up Visits: Frequent visits (every 3 to 4 weeks) are encouraged until initial weight-loss goals (5 to 10 percent of body weight) are achieved.   At that point, less frequent visits are typically scheduled as needed for individual patients. However, since obesity is considered a chronic life-long problem for many individuals, periodic continual follow up is recommended.  Research has shown that weight-loss as low as 5 percent of initial body weight can lead to favorable improvements in blood  pressure, cholesterol, glucose levels and insulin sensitivity. The risk of developing heart disease is reduced the most in patients who have impaired glucose tolerance, type 2 diabetes or high blood pressure.   General eating tips  What to Avoid . Avoid added sugars o Often added sugar can be found in processed foods such as many condiments, dry cereals, cakes, cookies, chips, crisps, crackers, candies, sweetened drinks, etc.  o Read labels and AVOID/DECREASE use of foods with the following in their ingredient list: Sugar, fructose, high fructose corn syrup, sucrose, glucose, maltose, dextrose, molasses, cane sugar, brown sugar, any type of syrup, agave nectar, etc.   . Avoid snacking in between meals- drink water or if you feel you need a snack, pick a high water content snack such as cucumbers, watermelon, or any veggie.  Marland Kitchen Avoid foods made with flour o If you are going to eat food made with flour, choose those made with whole-grains; and, minimize your consumption as much as is tolerable . Avoid processed foods o These foods are generally stocked in the middle of the grocery store.  o Focus on shopping on the perimeter of the grocery.  What to Include . Vegetables o GREEN LEAFY VEGETABLES: Kale, spinach, mustard greens, collard greens, cabbage, broccoli, etc. o OTHER: Asparagus, cauliflower, eggplant, carrots, peas, Brussel sprouts, tomatoes, bell peppers, zucchini, beets, cucumbers, etc. . Grains, seeds, and legumes o Beans: kidney beans, black eyed peas, garbanzo beans, black beans, pinto beans, etc. o Whole, unrefined grains: brown rice, barley, bulgur, oatmeal, etc. . Healthy fats  o  Avoid highly processed fats such as vegetable oil o Examples of healthy fats: avocado, olives, virgin olive oil, dark chocolate (?72% Cocoa), nuts (peanuts, almonds, walnuts, cashews, pecans, etc.) o Please still do small amount of these healthy fats, they are dense in calories.  . Low - Moderate Intake  of Animal Sources of Protein o Meat sources: chicken, [place], salmon, tuna. Limit to 4 ounces of meat at one time or the size of your palm. o Consider limiting dairy sources, but when choosing dairy focus on: PLAIN [place] yogurt, cottage cheese, high-protein milk . Fruit o Choose berries

## 2019-08-20 LAB — COMPLETE METABOLIC PANEL WITH GFR
AG Ratio: 1.4 (calc) (ref 1.0–2.5)
ALT: 16 U/L (ref 6–29)
AST: 13 U/L (ref 10–30)
Albumin: 4.3 g/dL (ref 3.6–5.1)
Alkaline phosphatase (APISO): 60 U/L (ref 31–125)
BUN: 8 mg/dL (ref 7–25)
CO2: 24 mmol/L (ref 20–32)
Calcium: 9.2 mg/dL (ref 8.6–10.2)
Chloride: 107 mmol/L (ref 98–110)
Creat: 0.61 mg/dL (ref 0.50–1.10)
GFR, Est African American: 131 mL/min/{1.73_m2} (ref >=60)
GFR, Est Non African American: 113 mL/min/{1.73_m2} (ref >=60)
Globulin: 3.1 g/dL (calc) (ref 1.9–3.7)
Glucose, Bld: 86 mg/dL (ref 65–99)
Potassium: 3.8 mmol/L (ref 3.5–5.3)
Sodium: 139 mmol/L (ref 135–146)
Total Bilirubin: 0.5 mg/dL (ref 0.2–1.2)
Total Protein: 7.4 g/dL (ref 6.1–8.1)

## 2019-08-20 LAB — CBC WITH DIFFERENTIAL/PLATELET
Absolute Monocytes: 459 cells/uL (ref 200–950)
Basophils Absolute: 43 cells/uL (ref 0–200)
Basophils Relative: 0.5 %
Eosinophils Absolute: 213 cells/uL (ref 15–500)
Eosinophils Relative: 2.5 %
HCT: 43.7 % (ref 35.0–45.0)
Hemoglobin: 14.7 g/dL (ref 11.7–15.5)
Lymphs Abs: 2899 cells/uL (ref 850–3900)
MCH: 31.9 pg (ref 27.0–33.0)
MCHC: 33.6 g/dL (ref 32.0–36.0)
MCV: 94.8 fL (ref 80.0–100.0)
MPV: 10.4 fL (ref 7.5–12.5)
Monocytes Relative: 5.4 %
Neutro Abs: 4888 cells/uL (ref 1500–7800)
Neutrophils Relative %: 57.5 %
Platelets: 286 10*3/uL (ref 140–400)
RBC: 4.61 10*6/uL (ref 3.80–5.10)
RDW: 12.2 % (ref 11.0–15.0)
Total Lymphocyte: 34.1 %
WBC: 8.5 10*3/uL (ref 3.8–10.8)

## 2019-08-20 LAB — VITAMIN D 25 HYDROXY (VIT D DEFICIENCY, FRACTURES): Vit D, 25-Hydroxy: 12 ng/mL — ABNORMAL LOW (ref 30–100)

## 2019-08-20 LAB — LIPID PANEL
Cholesterol: 144 mg/dL (ref <200)
HDL: 43 mg/dL — ABNORMAL LOW (ref >=50)
LDL Cholesterol (Calc): 75 mg/dL (calc)
Non-HDL Cholesterol (Calc): 101 mg/dL (calc) (ref <130)
Total CHOL/HDL Ratio: 3.3 (calc) (ref <5.0)
Triglycerides: 163 mg/dL — ABNORMAL HIGH (ref <150)

## 2019-08-20 LAB — MAGNESIUM: Magnesium: 2.1 mg/dL (ref 1.5–2.5)

## 2019-08-20 LAB — TSH: TSH: 1.05 mIU/L

## 2019-11-03 ENCOUNTER — Other Ambulatory Visit: Payer: Self-pay | Admitting: Internal Medicine

## 2019-11-03 DIAGNOSIS — E669 Obesity, unspecified: Secondary | ICD-10-CM

## 2019-11-03 DIAGNOSIS — R519 Headache, unspecified: Secondary | ICD-10-CM

## 2019-12-12 ENCOUNTER — Other Ambulatory Visit: Payer: Self-pay | Admitting: Physician Assistant

## 2019-12-12 DIAGNOSIS — Z1231 Encounter for screening mammogram for malignant neoplasm of breast: Secondary | ICD-10-CM

## 2019-12-26 ENCOUNTER — Ambulatory Visit: Payer: 59

## 2020-01-10 ENCOUNTER — Ambulatory Visit
Admission: RE | Admit: 2020-01-10 | Discharge: 2020-01-10 | Disposition: A | Discharge status: Home / Self Care | Payer: 59 | Source: Ambulatory Visit | Attending: Physician Assistant | Admitting: Physician Assistant

## 2020-01-10 ENCOUNTER — Other Ambulatory Visit: Payer: Self-pay

## 2020-01-10 DIAGNOSIS — Z1231 Encounter for screening mammogram for malignant neoplasm of breast: Secondary | ICD-10-CM

## 2020-01-15 ENCOUNTER — Other Ambulatory Visit: Payer: Self-pay | Admitting: Physician Assistant

## 2020-01-15 DIAGNOSIS — R928 Other abnormal and inconclusive findings on diagnostic imaging of breast: Secondary | ICD-10-CM

## 2020-01-23 ENCOUNTER — Other Ambulatory Visit: Payer: Self-pay | Admitting: Physician Assistant

## 2020-01-23 ENCOUNTER — Ambulatory Visit
Admission: RE | Admit: 2020-01-23 | Discharge: 2020-01-23 | Disposition: A | Discharge status: Home / Self Care | Payer: 59 | Source: Ambulatory Visit | Attending: Physician Assistant | Admitting: Physician Assistant

## 2020-01-23 ENCOUNTER — Other Ambulatory Visit: Payer: Self-pay

## 2020-01-23 DIAGNOSIS — N6489 Other specified disorders of breast: Secondary | ICD-10-CM

## 2020-01-23 DIAGNOSIS — R928 Other abnormal and inconclusive findings on diagnostic imaging of breast: Secondary | ICD-10-CM

## 2020-03-02 ENCOUNTER — Other Ambulatory Visit: Payer: Self-pay | Admitting: Internal Medicine

## 2020-03-02 DIAGNOSIS — R519 Headache, unspecified: Secondary | ICD-10-CM

## 2020-03-02 DIAGNOSIS — F419 Anxiety disorder, unspecified: Secondary | ICD-10-CM

## 2020-03-02 DIAGNOSIS — R0989 Other specified symptoms and signs involving the circulatory and respiratory systems: Secondary | ICD-10-CM

## 2020-03-02 MED ORDER — METOPROLOL SUCCINATE ER 25 MG PO TB24: ORAL_TABLET | ORAL | 0 refills | Status: DC

## 2020-03-25 ENCOUNTER — Encounter: Payer: 59 | Admitting: Physician Assistant

## 2020-04-14 ENCOUNTER — Other Ambulatory Visit: Payer: 59

## 2020-04-20 ENCOUNTER — Encounter: Payer: 59 | Admitting: Adult Health Nurse Practitioner

## 2020-05-05 NOTE — Progress Notes (Signed)
Complete Physical  Assessment and Plan:  Encounter for general adult medical examination with abnormal findings 1 year  Fatty liver Check labs, avoid tylenol, alcohol, weight loss advised.   Mixed hyperlipidemia -     Lipid Profile check lipids decrease fatty foods increase activity.   Labile hypertension -     metoprolol succinate (TOPROL-XL) 25 MG 24 hr tablet; Take 1 tablet Daily for BP & Migraine Prevention - continue medications, DASH diet, exercise and monitor at home. Call if greater than 130/80.  -     CBC with Diff -     COMPLETE METABOLIC PANEL WITH GFR -     TSH -     Urinalysis, Routine w reflex microscopic -     Microalbumin / Creatinine Urine Ratio  Sleep apnea with hypersomnolence Continue to follow up Dr. Toy Cookey, continue oral device  Depression with anxiety Start new medication as prescribed; celexa 20 mg tabs per patient preference as has tolerated in the past; 1/2 tab daily x 2 weeks then whole tab daily  Stress management techniques discussed, increase water, good sleep hygiene discussed, increase exercise, and increase veggies.  Follow up 2 month, call the office if any new AE's from medications and we will switch them  Obesity (BMI 30.0-34.9) Recommended diet heavy in fruits and veggies and low in animal meats, cheeses, and dairy products, appropriate calorie intake Follow up at next visit  Medication management -     Magnesium  Vitamin D deficiency -     Vitamin D (25 hydroxy)  Migraine/ daily headache Will treat anxiety with celexa  Stress management reviewed; lifestyle reviewed Has had vision check  If HA frequency not improving increase topamax to 100 mg daily  - Lie in darkened room and apply cold packs as needed for pain. Side effect profile discussed in detail. Asked to keep headache diary. Avoid alcohol and stressful situations as much as possible. -     metoprolol succinate (TOPROL-XL) 25 MG 24 hr tablet; Take 1 tablet Daily for BP &  Migraine Prevention -     topiramate (TOPAMAX) 100 MG tablet; Take 1 tablet (100 mg total) by mouth daily - will try preventable medications to reduce the need for NSAID Patient knows to not take celebrex/ibuprofen together or same day  Screening for diabetes mellitus -     Hemoglobin A1c ([facility])  Screening, anemia, deficiency -     Vitamin B12  Family history of cardiovascular disease -     EKG 12-Lead  Need for tetanus vaccine - Td administered without complication today   S/p cholecystectomy, postprandial diarrhea - colestid discussed and sent in  -     colestipol (COLESTID) 1 g tablet; Start taking 1 tab daily in the morning for diarrhea; can add second tab in the evening if needed after 2 weeks.   Orders Placed This Encounter  Procedures  . Td vaccine greater than or equal to 7yo preservative free IM  . CBC with Differential/Platelet  . COMPLETE METABOLIC PANEL WITH GFR  . Magnesium  . Lipid panel  . TSH  . Hemoglobin A1c  . VITAMIN D 25 Hydroxy (Vit-D Deficiency, Fractures)  . Vitamin B12  . Microalbumin / creatinine urine ratio  . Urinalysis, Routine w reflex microscopic  . EKG 12-Lead    Discussed med's effects and SE's. Screening labs and tests as requested with regular follow-up as recommended. Over 40 minutes of exam, counseling, chart review, and complex, high level critical decision making was performed this  visit.   Future Appointments  Date Time Provider [place]  07/20/2020 11:30 AM Liane Comber, NP [facility] None  07/21/2020 11:00 AM [facility] 1 [facility]  05/06/2021 10:00 AM Liane Comber, NP [facility] None    HPI  45 y.o. female  presents for a complete physical and follow up for has Sleep apnea with hypersomnolence; Depression with anxiety; Hyperlipidemia; Labile hypertension; Obesity (BMI 30.0-34.9); and Fatty liver on their problem list.   She is recently separated, stress going through holidays, 2 daughters  and a younger son. Eldest about to graduate high school, recently diagnosed on autism spectrum. Very demanding job as well.   She had covid 19 over the New year - has mostly recovered but reports has some persistent congestion, taking mucinex, doing nasal steroid. Reports some residual brain fog. Is improving but annoying.   She has had a history of hematuria, had cysto 10+ years ago, her last CT scan in 02/14/2019 showed bladder wall thickening suggestive of cystitis, followed up with urology with no changes.   She had gallstones and fatty liver on the CT, [place] abd 04/2019 showed Cholelithiasis, Nonspecific 9 mm hypoechoic hepatic nodule grossly stable in size since 02/14/2019 and had cholecystectomy 08/01/2019, reports has had intermittent unpredictable episodes of diarrhea, trying to track and adjust diet but interested in med to help this that was mentioned by surgeon.   She has hx of depression/anxiety been seeing a therapist since 2020, she is taking the xanax AS needed 1/2-1 tablet 2-3 x a week. Counselor suggests trying a SSRI, she did try celexa but did not try it for long term. Interested in retrying today.   She has hx of frequent headaches; reports unilateral with vision changes and may have nausea; typical migraine. She has been on topiramate 50 mg with significant improvement, but recently having near daily HA in afternoon, taking ibuprofen.   Has sleep apnea, has mouth piece with Dr. Toy Cookey, sleep study 2016.    BMI is Body mass index is 29.05 kg/m., she is working on diet and exercise. Wt Readings from Last 3 Encounters:  05/06/20 164 lb (74.4 kg)  08/19/19 154 lb (69.9 kg)  08/01/19 158 lb 15.2 oz (72.1 kg)   Has history of palpitations, normal holter 2014.  First MI in father at 56, died at 72 from MI, was alcoholic.  Her blood pressure has been controlled at home, BP is doing well with an increase in the metoprolol XL to 25 mg a day, today their BP is BP: 134/84 She does not  workout, she does some walking. She denies chest pain, shortness of breath, dizziness.    She is not on cholesterol medication and denies myalgias. Her cholesterol is at goal. The cholesterol last visit was:   Lab Results  Component Value Date   CHOL 144 08/19/2019   HDL 43 (L) 08/19/2019   LDLCALC 75 08/19/2019   TRIG 163 (H) 08/19/2019   CHOLHDL 3.3 08/19/2019    Last A1C in the office was:  Lab Results  Component Value Date   HGBA1C 5.1 04/17/2019   Patient is on Vitamin D supplement, taking 5000 IU daily    Lab Results  Component Value Date   VD25OH 12 (L) 08/19/2019     Lab Results  Component Value Date   VITAMINB12 418 04/17/2019   She is s/p endometrial ablation and BSO and she has felt better since that time.  Lab Results  Component Value Date   IRON  109 03/19/2018   TIBC 353 03/19/2018      Current Medications:    Current Outpatient Medications (Cardiovascular):  .  colestipol (COLESTID) 1 g tablet, Start taking 1 tab daily in the morning for diarrhea; can add second tab in the evening if needed after 2 weeks. .  metoprolol succinate (TOPROL-XL) 25 MG 24 hr tablet, Take      1 tablet      Daily      for BP & Migraine Prevention   Current Outpatient Medications (Analgesics):  .  celecoxib (CELEBREX) 200 MG capsule, Take 1 capsule Daily with Food for Pain & Inflammation (Patient not taking: Reported on 05/06/2020)   Current Outpatient Medications (Other):  Marland Kitchen  ALPRAZolam (XANAX) 0.25 MG tablet, TAKE 1/2 TO 1 TABLET ONCE OR TWICE DAILY AS NEEDED FOR ANXIETY ATTACKS (Patient taking differently: Take 0.125-0.25 mg by mouth 2 (two) times daily as needed for anxiety.) .  Cholecalciferol (VITAMIN D) 125 MCG (5000 UT) CAPS, Take 5,000 Units by mouth daily. .  citalopram (CELEXA) 20 MG tablet, Take 1 tablet (20 mg total) by mouth daily. .  famotidine (PEPCID) 40 MG tablet, Take 1 tablet Daily for Heartburn & Indigestion (Patient taking differently: Take 40 mg by mouth  at bedtime.) .  metroNIDAZOLE (METROCREAM) 0.75 % cream, Apply topically 2 (two) times daily. (Patient taking differently: Apply 1 application topically 2 (two) times daily as needed (rosacea).) .  valACYclovir (VALTREX) 500 MG tablet, Take 1 tablet (500 mg total) by mouth 2 (two) times daily. (Patient taking differently: Take 500 mg by mouth 2 (two) times daily as needed (outbreaks).) .  meclizine (ANTIVERT) 25 MG tablet, Take 1/2 to 1 tablet 2 to 3 x /day as needed for Dizziness / Vertigo (Patient not taking: Reported on 05/06/2020) .  topiramate (TOPAMAX) 100 MG tablet, Take 1 tablet (100 mg total) by mouth at bedtime.  Allergies:  Allergies  Allergen Reactions  . Codeine Nausea And Vomiting  . Azithromycin Nausea And Vomiting  . Chlorhexidine Gluconate Rash    CHG   Medical History:  She has Sleep apnea with hypersomnolence; Depression with anxiety; Hyperlipidemia; Labile hypertension; Obesity (BMI 30.0-34.9); and Fatty liver on their problem list.   Health Maintenance:   Immunization History  Administered Date(s) Administered  . Influenza-Unspecified 01/12/2015  . PPD Test 11/11/2013  . Td 05/06/2020  . Tdap 04/11/2010   Tetanus: 2012  TODAY Pneumovax: n/a Flu vaccine: 2021 at work  Shingrix: discuss age 74 Covid 88: not yet, considering, no questions   No LMP recorded. Pap: 2020, q3 years Dr. Willis Modena MGM: 01/2020, asymmetry, diagnostic planning in 07/2020 DEXA:  Colonoscopy: due age 53  EGD:  Sleep study 2016  Last vision: 2022, [place] [facility], glasses occasionally  Dental: [place], last 2021, goes q8m  Patient Care Team: Unk Pinto, MD as PCP - General (Internal Medicine) Rozetta Nunnery, MD as Consulting Physician (Otolaryngology) Cheri Fowler, MD as Consulting Physician (Obstetrics and Gynecology) [place], [place] Eduardo Osier., MD as Attending Physician (Urology) Earlie Server, MD as Consulting Physician (Orthopedic Surgery)  Surgical History:   She has a past surgical history that includes Mass excision (Left, 01/14/2016); Dilation and curettage of uterus (age 30); Ess[place] tubal ligation (Bilateral, Jan or Feb 2012   dr meisinger office); Laparoscopic bilateral salpingectomy (Bilateral, 09/01/2017); Endometrial ablation (N/A, 09/01/2017); Wisdom tooth extraction; Cholecystectomy (N/A, 08/01/2019); and Breast cyst excision (Left). Family History:  Herfamily history includes Alcohol abuse in her father; Anxiety disorder in her daughter; Autism  in her daughter; Breast cancer in her paternal aunt and paternal aunt; Cancer in her paternal grandmother; Diabetes in her paternal uncle; Heart disease in her father and maternal grandmother; Hyperlipidemia in her father; Hypertension in her father and mother; Stroke in her paternal uncle. Social History:  She reports that she has never smoked. She has never used smokeless tobacco. She reports previous alcohol use. She reports that she does not use drugs.   Review of Systems: Review of Systems  Constitutional: Negative for malaise/fatigue and weight loss.  HENT: Negative for hearing loss and tinnitus.   Eyes: Negative for blurred vision and double vision.  Respiratory: Negative for cough, shortness of breath and wheezing.   Cardiovascular: Negative for chest pain, palpitations, orthopnea, claudication and leg swelling.  Gastrointestinal: Negative for abdominal pain, blood in stool, constipation, diarrhea, heartburn, melena, nausea and vomiting.  Genitourinary: Negative.   Musculoskeletal: Negative for joint pain and myalgias.  Skin: Negative for rash.  Neurological: Positive for headaches (nearly daily, unilateral with vision changes and intermittent nausea). Negative for dizziness, tingling, sensory change and weakness.  Endo/Heme/Allergies: Negative for polydipsia.  Psychiatric/Behavioral: Positive for depression. Negative for hallucinations, memory loss, substance abuse and suicidal ideas. The  patient is nervous/anxious. The patient does not have insomnia.   All other systems reviewed and are negative.   Physical Exam: Estimated body mass index is 29.05 kg/m as calculated from the following:   Height as of this encounter: 5\' 3"  (1.6 m).   Weight as of this encounter: 164 lb (74.4 kg). BP 134/84   Pulse 87   Temp (!) 97.5 F (36.4 C)   Ht 5\' 3"  (1.6 m)   Wt 164 lb (74.4 kg)   SpO2 98%   BMI 29.05 kg/m  General Appearance: Well nourished, in no apparent distress.  Eyes: PERRLA, EOMs, conjunctiva no swelling or erythema Sinuses: No Frontal/maxillary tenderness  ENT/Mouth: Ext aud canals clear, normal light reflex with TMs without erythema, bulging. Good dentition. No erythema, swelling, or exudate on post pharynx. Tonsils not swollen or erythematous. Hearing normal.  Neck: Supple, thyroid normal. No bruits  Respiratory: Respiratory effort normal, BS equal bilaterally without rales, rhonchi, wheezing or stridor.  Cardio: RRR without murmurs, rubs or gallops. Brisk peripheral pulses without edema.  Chest: symmetric, with normal excursions and percussion.  Breasts: defer to GYN  Abdomen: Soft, BS x 4, non-tender no guarding, rebound, hernias, masses, or organomegaly.  Lymphatics: Non tender without lymphadenopathy.  Genitourinary: defer to GYN Musculoskeletal: Full ROM all peripheral extremities,5/5 strength, and normal gait.  Skin: Warm, dry without rashes, lesions, ecchymosis. Neuro: Cranial nerves intact, reflexes equal bilaterally. Normal muscle tone, no cerebellar symptoms. Sensation intact.  Psych: Awake and oriented X 3, normal affect, Insight and Judgment appropriate.   EKG: WNL no ST changes.  Emily Nolan 1:57 PM [facility]

## 2020-05-06 ENCOUNTER — Encounter: Payer: Self-pay | Admitting: Adult Health

## 2020-05-06 ENCOUNTER — Other Ambulatory Visit: Payer: Self-pay

## 2020-05-06 ENCOUNTER — Ambulatory Visit (INDEPENDENT_AMBULATORY_CARE_PROVIDER_SITE_OTHER): Payer: 59 | Admitting: Adult Health

## 2020-05-06 VITALS — BP 134/84 | HR 87 | Temp 97.5°F | Ht 63.0 in | Wt 164.0 lb

## 2020-05-06 DIAGNOSIS — Z23 Encounter for immunization: Secondary | ICD-10-CM | POA: Diagnosis not present

## 2020-05-06 DIAGNOSIS — E669 Obesity, unspecified: Secondary | ICD-10-CM

## 2020-05-06 DIAGNOSIS — R0989 Other specified symptoms and signs involving the circulatory and respiratory systems: Secondary | ICD-10-CM | POA: Diagnosis not present

## 2020-05-06 DIAGNOSIS — E538 Deficiency of other specified B group vitamins: Secondary | ICD-10-CM

## 2020-05-06 DIAGNOSIS — K760 Fatty (change of) liver, not elsewhere classified: Secondary | ICD-10-CM

## 2020-05-06 DIAGNOSIS — G4710 Hypersomnia, unspecified: Secondary | ICD-10-CM

## 2020-05-06 DIAGNOSIS — Z1329 Encounter for screening for other suspected endocrine disorder: Secondary | ICD-10-CM

## 2020-05-06 DIAGNOSIS — Z0000 Encounter for general adult medical examination without abnormal findings: Secondary | ICD-10-CM | POA: Diagnosis not present

## 2020-05-06 DIAGNOSIS — E782 Mixed hyperlipidemia: Secondary | ICD-10-CM

## 2020-05-06 DIAGNOSIS — G43909 Migraine, unspecified, not intractable, without status migrainosus: Secondary | ICD-10-CM

## 2020-05-06 DIAGNOSIS — Z136 Encounter for screening for cardiovascular disorders: Secondary | ICD-10-CM | POA: Diagnosis not present

## 2020-05-06 DIAGNOSIS — Z131 Encounter for screening for diabetes mellitus: Secondary | ICD-10-CM

## 2020-05-06 DIAGNOSIS — F418 Other specified anxiety disorders: Secondary | ICD-10-CM

## 2020-05-06 DIAGNOSIS — E559 Vitamin D deficiency, unspecified: Secondary | ICD-10-CM

## 2020-05-06 DIAGNOSIS — F419 Anxiety disorder, unspecified: Secondary | ICD-10-CM

## 2020-05-06 MED ORDER — COLESTIPOL HCL 1 G PO TABS: ORAL_TABLET | ORAL | 2 refills | Status: DC

## 2020-05-06 MED ORDER — TOPIRAMATE 100 MG PO TABS: 100.0000 mg | ORAL_TABLET | Freq: Every day | ORAL | 0 refills | Status: DC

## 2020-05-06 MED ORDER — CITALOPRAM HYDROBROMIDE 20 MG PO TABS: 20.0000 mg | ORAL_TABLET | Freq: Every day | ORAL | 0 refills | Status: DC

## 2020-05-06 NOTE — Patient Instructions (Addendum)
Emily Nolan , Thank you for taking time to come for your Annual Wellness Visit. I appreciate your ongoing commitment to your health goals. Please review the following plan we discussed and let me know if I can assist you in the future.    This is a list of the screening recommended for you and due dates:  Health Maintenance  Topic Date Due  . Tetanus Vaccine  04/11/2020  . COVID-19 Vaccine (1) 05/22/2020*  .  Hepatitis C: One time screening is recommended by Center for Disease Control  (CDC) for  adults born from [birth year] through 1963.   05/06/2021*  . HIV Screening  05/06/2021*  . Pap Smear  01/31/2022  . Flu Shot  Discontinued  *Topic was postponed. The date shown is not the original due date.    Know what a healthy weight is for you (roughly BMI <25) and aim to maintain this  Aim for 7+ servings of fruits and vegetables daily  65-80+ fluid ounces of water or unsweet tea for healthy kidneys  Limit to max 1 drink of alcohol per day; avoid smoking/tobacco  Limit animal fats in diet for cholesterol and heart health - choose grass fed whenever available  Avoid highly processed foods, and foods high in saturated/trans fats  Aim for low stress - take time to unwind and care for your mental health  Aim for 150 min of moderate intensity exercise weekly for heart health, and weights twice weekly for bone health  Aim for 7-9 hours of sleep daily     Please start celexa 1/2 tab daily for 2 weeks, then increase to whole tab if tolerating well   Try working on lifesytle Keep headache log   Citalopram tablets What is this medicine? CITALOPRAM (sye TAL oh pram) is a medicine for depression. This medicine may be used for other purposes; ask your health care provider or pharmacist if you have questions. COMMON BRAND NAME(S): Celexa What should I tell my health care provider before I take this medicine? They need to know if you have any of these conditions:  bleeding  disorders  bipolar disorder or a family history of bipolar disorder  glaucoma  heart disease  history of irregular heartbeat  kidney disease  liver disease  low levels of magnesium or potassium in the blood  receiving electroconvulsive therapy  seizures  suicidal thoughts, plans, or attempt; a previous suicide attempt by you or a family member  take medicines that treat or prevent blood clots  thyroid disease  an unusual or allergic reaction to citalopram, escitalopram, other medicines, foods, dyes, or preservatives  pregnant or trying to become pregnant  breast-feeding How should I use this medicine? Take this medicine by mouth with a glass of water. Follow the directions on the prescription label. You can take it with or without food. Take your medicine at regular intervals. Do not take your medicine more often than directed. Do not stop taking this medicine suddenly except upon the advice of your doctor. Stopping this medicine too quickly may cause serious side effects or your condition may worsen. A special MedGuide will be given to you by the pharmacist with each prescription and refill. Be sure to read this information carefully each time. Talk to your pediatrician regarding the use of this medicine in children. Special care may be needed. Patients over 27 years old may have a stronger reaction and need a smaller dose. Overdosage: If you think you have taken too much of this medicine contact  a poison control center or emergency room at once. NOTE: This medicine is only for you. Do not share this medicine with others. What if I miss a dose? If you miss a dose, take it as soon as you can. If it is almost time for your next dose, take only that dose. Do not take double or extra doses. What may interact with this medicine? Do not take this medicine with any of the following medications:  certain medicines for fungal infections like fluconazole, itraconazole, ketoconazole,  posaconazole, voriconazole  cisapride  dronedarone  escitalopram  linezolid  MAOIs like Carbex, Eldepryl, Marplan, Nardil, and Parnate  methylene blue (injected into a vein)  pimozide  thioridazine This medicine may also interact with the following medications:  alcohol  amphetamines  aspirin and aspirin-like medicines  carbamazepine  certain medicines for depression, anxiety, or psychotic disturbances  certain medicines for infections like chloroquine, clarithromycin, erythromycin, furazolidone, isoniazid, pentamidine  certain medicines for migraine headaches like almotriptan, eletriptan, frovatriptan, naratriptan, rizatriptan, sumatriptan, zolmitriptan  certain medicines for sleep  certain medicines that treat or prevent blood clots like dalteparin, enoxaparin, warfarin  cimetidine  diuretics  dofetilide  fentanyl  lithium  methadone  metoprolol  NSAIDs, medicines for pain and inflammation, like ibuprofen or naproxen  omeprazole  other medicines that prolong the QT interval (cause an abnormal heart rhythm)  procarbazine  rasagiline  supplements like St. John's wort, kava kava, valerian  tramadol  tryptophan  ziprasidone This list may not describe all possible interactions. Give your health care provider a list of all the medicines, herbs, non-prescription drugs, or dietary supplements you use. Also tell them if you smoke, drink alcohol, or use illegal drugs. Some items may interact with your medicine. What should I watch for while using this medicine? Tell your doctor if your symptoms do not get better or if they get worse. Visit your doctor or health care professional for regular checks on your progress. Because it may take several weeks to see the full effects of this medicine, it is important to continue your treatment as prescribed by your doctor. Patients and their families should watch out for new or worsening thoughts of suicide or  depression. Also watch out for sudden changes in feelings such as feeling anxious, agitated, panicky, irritable, hostile, aggressive, impulsive, severely restless, overly excited and hyperactive, or not being able to sleep. If this happens, especially at the beginning of treatment or after a change in dose, call your health care professional. Dennis Bast may get drowsy or dizzy. Do not drive, use machinery, or do anything that needs mental alertness until you know how this medicine affects you. Do not stand or sit up quickly, especially if you are an older patient. This reduces the risk of dizzy or fainting spells. Alcohol may interfere with the effect of this medicine. Avoid alcoholic drinks. Your mouth may get dry. Chewing sugarless gum or sucking hard candy, and drinking plenty of water will help. Contact your doctor if the problem does not go away or is severe. What side effects may I notice from receiving this medicine? Side effects that you should report to your doctor or health care professional as soon as possible:  allergic reactions like skin rash, itching or hives, swelling of the face, lips, or tongue  anxious  black, tarry stools  breathing problems  changes in vision  chest pain  confusion  elevated mood, decreased need for sleep, racing thoughts, impulsive behavior  eye pain  fast, irregular heartbeat  feeling faint or lightheaded, falls  feeling agitated, angry, or irritable  hallucination, loss of contact with reality  loss of balance or coordination  loss of memory  painful or prolonged erections  restlessness, pacing, inability to keep still  seizures  stiff muscles  suicidal thoughts or other mood changes  trouble sleeping  unusual bleeding or bruising  unusually weak or tired  vomiting Side effects that usually do not require medical attention (report to your doctor or health care professional if they continue or are bothersome):  change in appetite  or weight  change in sex drive or performance  dizziness  headache  increased sweating  indigestion, nausea  tremors This list may not describe all possible side effects. Call your doctor for medical advice about side effects. You may report side effects to FDA at 1-800-FDA-1088. Where should I keep my medicine? Keep out of reach of children. Store at room temperature between 15 and 30 degrees C (59 and 86 degrees F). Throw away any unused medicine after the expiration date. NOTE: This sheet is a summary. It may not cover all possible information. If you have questions about this medicine, talk to your doctor, pharmacist, or health care provider.  2021 Elsevier/Gold Standard (2018-03-19 09:05:36)     Migraine Headache A migraine headache is an intense, throbbing pain on one side or both sides of the head. Migraine headaches may also cause other symptoms, such as nausea, vomiting, and sensitivity to light and noise. A migraine headache can last from 4 hours to 3 days. Talk with your doctor about what things may bring on (trigger) your migraine headaches. What are the causes? The exact cause of this condition is not known. However, a migraine may be caused when nerves in the brain become irritated and release chemicals that cause inflammation of blood vessels. This inflammation causes pain. This condition may be triggered or caused by:  Drinking alcohol.  Smoking.  Taking medicines, such as: ? Medicine used to treat chest pain (nitroglycerin). ? Birth control pills. ? Estrogen. ? Certain blood pressure medicines.  Eating or drinking products that contain nitrates, glutamate, aspartame, or tyramine. Aged cheeses, chocolate, or caffeine may also be triggers.  Doing physical activity. Other things that may trigger a migraine headache include:  Menstruation.  Pregnancy.  Hunger.  Stress.  Lack of sleep or too much sleep.  Weather changes.  Fatigue. What increases  the risk? The following factors may make you more likely to experience migraine headaches:  Being a certain age. This condition is more common in people who are 6425-43 years old.  Being female.  Having a family history of migraine headaches.  Being Caucasian.  Having a mental health condition, such as depression or anxiety.  Being obese. What are the signs or symptoms? The main symptom of this condition is pulsating or throbbing pain. This pain may:  Happen in any area of the head, such as on one side or both sides.  Interfere with daily activities.  Get worse with physical activity.  Get worse with exposure to bright lights or loud noises. Other symptoms may include:  Nausea.  Vomiting.  Dizziness.  General sensitivity to bright lights, loud noises, or smells. Before you get a migraine headache, you may get warning signs (an aura). An aura may include:  Seeing flashing lights or having blind spots.  Seeing bright spots, halos, or zigzag lines.  Having tunnel vision or blurred vision.  Having numbness or a tingling feeling.  Having trouble  talking.  Having muscle weakness. Some people have symptoms after a migraine headache (postdromal phase), such as:  Feeling tired.  Difficulty concentrating. How is this diagnosed? A migraine headache can be diagnosed based on:  Your symptoms.  A physical exam.  Tests, such as: ? CT scan or an MRI of the head. These imaging tests can help rule out other causes of headaches. ? Taking fluid from the spine (lumbar puncture) and analyzing it (cerebrospinal fluid analysis, or CSF analysis). How is this treated? This condition may be treated with medicines that:  Relieve pain.  Relieve nausea.  Prevent migraine headaches. Treatment for this condition may also include:  Acupuncture.  Lifestyle changes like avoiding foods that trigger migraine headaches.  Biofeedback.  Cognitive behavioral therapy. Follow these  instructions at home: Medicines  Take over-the-counter and prescription medicines only as told by your health care provider.  Ask your health care provider if the medicine prescribed to you: ? Requires you to avoid driving or using heavy machinery. ? Can cause constipation. You may need to take these actions to prevent or treat constipation:  Drink enough fluid to keep your urine pale yellow.  Take over-the-counter or prescription medicines.  Eat foods that are high in fiber, such as beans, whole grains, and fresh fruits and vegetables.  Limit foods that are high in fat and processed sugars, such as fried or sweet foods. Lifestyle  Do not drink alcohol.  Do not use any products that contain nicotine or tobacco, such as cigarettes, e-cigarettes, and chewing tobacco. If you need help quitting, ask your health care provider.  Get at least 8 hours of sleep every night.  Find ways to manage stress, such as meditation, deep breathing, or yoga. General instructions  Keep a journal to find out what may trigger your migraine headaches. For example, write down: ? What you eat and drink. ? How much sleep you get. ? Any change to your diet or medicines.  If you have a migraine headache: ? Avoid things that make your symptoms worse, such as bright lights. ? It may help to lie down in a dark, quiet room. ? Do not drive or use heavy machinery. ? Ask your health care provider what activities are safe for you while you are experiencing symptoms.  Keep all follow-up visits as told by your health care provider. This is important.      Contact a health care provider if:  You develop symptoms that are different or more severe than your usual migraine headache symptoms.  You have more than 15 headache days in one month. Get help right away if:  Your migraine headache becomes severe.  Your migraine headache lasts longer than 72 hours.  You have a fever.  You have a stiff neck.  You  have vision loss.  Your muscles feel weak or like you cannot control them.  You start to lose your balance often.  You have trouble walking.  You faint.  You have a seizure. Summary  A migraine headache is an intense, throbbing pain on one side or both sides of the head. Migraines may also cause other symptoms, such as nausea, vomiting, and sensitivity to light and noise.  This condition may be treated with medicines and lifestyle changes. You may also need to avoid certain things that trigger a migraine headache.  Keep a journal to find out what may trigger your migraine headaches.  Contact your health care provider if you have more than 15 headache days  in a month or you develop symptoms that are different or more severe than your usual migraine headache symptoms. This information is not intended to replace advice given to you by your health care provider. Make sure you discuss any questions you have with your health care provider. Document Revised: 07/20/2018 Document Reviewed: 05/10/2018 Elsevier Patient Education  [place].

## 2020-05-07 LAB — CBC WITH DIFFERENTIAL/PLATELET
Absolute Monocytes: 453 cells/uL (ref 200–950)
Basophils Absolute: 37 cells/uL (ref 0–200)
Basophils Relative: 0.5 %
Eosinophils Absolute: 110 cells/uL (ref 15–500)
Eosinophils Relative: 1.5 %
HCT: 41.8 % (ref 35.0–45.0)
Hemoglobin: 14.5 g/dL (ref 11.7–15.5)
Lymphs Abs: 2402 cells/uL (ref 850–3900)
MCH: 32.6 pg (ref 27.0–33.0)
MCHC: 34.7 g/dL (ref 32.0–36.0)
MCV: 93.9 fL (ref 80.0–100.0)
MPV: 10.7 fL (ref 7.5–12.5)
Monocytes Relative: 6.2 %
Neutro Abs: 4300 cells/uL (ref 1500–7800)
Neutrophils Relative %: 58.9 %
Platelets: 224 10*3/uL (ref 140–400)
RBC: 4.45 10*6/uL (ref 3.80–5.10)
RDW: 11.8 % (ref 11.0–15.0)
Total Lymphocyte: 32.9 %
WBC: 7.3 10*3/uL (ref 3.8–10.8)

## 2020-05-07 LAB — URINALYSIS, ROUTINE W REFLEX MICROSCOPIC
Bilirubin Urine: NEGATIVE
Glucose, UA: NEGATIVE
Hyaline Cast: NONE SEEN /LPF
Ketones, ur: NEGATIVE
Nitrite: NEGATIVE
Protein, ur: NEGATIVE
Specific Gravity, Urine: 1.023 (ref 1.001–1.03)
pH: <=5 (ref 5.0–8.0)

## 2020-05-07 LAB — LIPID PANEL
Cholesterol: 131 mg/dL (ref <200)
HDL: 51 mg/dL (ref >=50)
LDL Cholesterol (Calc): 63 mg/dL (calc)
Non-HDL Cholesterol (Calc): 80 mg/dL (calc) (ref <130)
Total CHOL/HDL Ratio: 2.6 (calc) (ref <5.0)
Triglycerides: 85 mg/dL (ref <150)

## 2020-05-07 LAB — COMPLETE METABOLIC PANEL WITH GFR
AG Ratio: 1.4 (calc) (ref 1.0–2.5)
ALT: 13 U/L (ref 6–29)
AST: 10 U/L (ref 10–30)
Albumin: 4.2 g/dL (ref 3.6–5.1)
Alkaline phosphatase (APISO): 47 U/L (ref 31–125)
BUN: 10 mg/dL (ref 7–25)
CO2: 23 mmol/L (ref 20–32)
Calcium: 8.7 mg/dL (ref 8.6–10.2)
Chloride: 107 mmol/L (ref 98–110)
Creat: 0.55 mg/dL (ref 0.50–1.10)
GFR, Est African American: 134 mL/min/{1.73_m2} (ref >=60)
GFR, Est Non African American: 116 mL/min/{1.73_m2} (ref >=60)
Globulin: 3.1 g/dL (calc) (ref 1.9–3.7)
Glucose, Bld: 87 mg/dL (ref 65–99)
Potassium: 3.8 mmol/L (ref 3.5–5.3)
Sodium: 138 mmol/L (ref 135–146)
Total Bilirubin: 0.6 mg/dL (ref 0.2–1.2)
Total Protein: 7.3 g/dL (ref 6.1–8.1)

## 2020-05-07 LAB — MICROALBUMIN / CREATININE URINE RATIO
Creatinine, Urine: 160 mg/dL (ref 20–275)
Microalb Creat Ratio: 12 mcg/mg creat (ref <30)
Microalb, Ur: 1.9 mg/dL

## 2020-05-07 LAB — VITAMIN D 25 HYDROXY (VIT D DEFICIENCY, FRACTURES): Vit D, 25-Hydroxy: 33 ng/mL (ref 30–100)

## 2020-05-07 LAB — HEMOGLOBIN A1C
Hgb A1c MFr Bld: 5.1 % of total Hgb (ref <5.7)
Mean Plasma Glucose: 100 mg/dL
eAG (mmol/L): 5.5 mmol/L

## 2020-05-07 LAB — VITAMIN B12: Vitamin B-12: 343 pg/mL (ref 200–1100)

## 2020-05-07 LAB — MAGNESIUM: Magnesium: 2 mg/dL (ref 1.5–2.5)

## 2020-05-07 LAB — TSH: TSH: 1.11 mIU/L

## 2020-05-28 ENCOUNTER — Other Ambulatory Visit: Payer: Self-pay | Admitting: Adult Health

## 2020-05-28 DIAGNOSIS — E669 Obesity, unspecified: Secondary | ICD-10-CM

## 2020-05-28 DIAGNOSIS — R519 Headache, unspecified: Secondary | ICD-10-CM

## 2020-05-28 DIAGNOSIS — G43909 Migraine, unspecified, not intractable, without status migrainosus: Secondary | ICD-10-CM

## 2020-05-28 MED ORDER — TOPIRAMATE 100 MG PO TABS
100.0000 mg | ORAL_TABLET | Freq: Every day | ORAL | 0 refills | Status: DC
Start: 2020-05-28 — End: 2020-09-16

## 2020-05-30 ENCOUNTER — Other Ambulatory Visit: Payer: Self-pay | Admitting: Internal Medicine

## 2020-05-30 DIAGNOSIS — R0989 Other specified symptoms and signs involving the circulatory and respiratory systems: Secondary | ICD-10-CM

## 2020-05-30 DIAGNOSIS — G8929 Other chronic pain: Secondary | ICD-10-CM

## 2020-05-30 DIAGNOSIS — R079 Chest pain, unspecified: Secondary | ICD-10-CM

## 2020-05-30 DIAGNOSIS — R519 Headache, unspecified: Secondary | ICD-10-CM

## 2020-05-30 DIAGNOSIS — F419 Anxiety disorder, unspecified: Secondary | ICD-10-CM

## 2020-07-13 ENCOUNTER — Encounter: Payer: Self-pay | Admitting: Adult Health

## 2020-07-13 ENCOUNTER — Ambulatory Visit (INDEPENDENT_AMBULATORY_CARE_PROVIDER_SITE_OTHER): Payer: 59 | Admitting: Adult Health

## 2020-07-13 ENCOUNTER — Other Ambulatory Visit: Payer: Self-pay

## 2020-07-13 VITALS — BP 124/80 | HR 70 | Temp 97.3°F | Wt 167.0 lb

## 2020-07-13 DIAGNOSIS — Z8719 Personal history of other diseases of the digestive system: Secondary | ICD-10-CM | POA: Diagnosis not present

## 2020-07-13 DIAGNOSIS — R1031 Right lower quadrant pain: Secondary | ICD-10-CM | POA: Diagnosis not present

## 2020-07-13 DIAGNOSIS — R10823 Right lower quadrant rebound abdominal tenderness: Secondary | ICD-10-CM

## 2020-07-13 MED ORDER — METRONIDAZOLE 500 MG PO TABS: 500.0000 mg | ORAL_TABLET | Freq: Two times a day (BID) | ORAL | 0 refills | Status: AC

## 2020-07-13 MED ORDER — CIPROFLOXACIN HCL 500 MG PO TABS: 500.0000 mg | ORAL_TABLET | Freq: Two times a day (BID) | ORAL | 0 refills | Status: AC

## 2020-07-13 NOTE — Patient Instructions (Addendum)
   Start cipro/flagyl tonight  Stick to fluids, broths until you have had CT scan   ER precautions if significantly worse tonight, uncontrolled throwing up, lots of blood in stool, severe chills/fever or rock hard stomach    Abdominal Pain, Adult Pain in the abdomen (abdominal pain) can be caused by many things. Often, abdominal pain is not serious and it gets better with no treatment or by being treated at home. However, sometimes abdominal pain is serious. Your health care provider will ask questions about your medical history and do a physical exam to try to determine the cause of your abdominal pain. Follow these instructions at home: Medicines  Take over-the-counter and prescription medicines only as told by your health care provider.  Do not take a laxative unless told by your health care provider. General instructions  Watch your condition for any changes.  Drink enough fluid to keep your urine pale yellow.  Keep all follow-up visits as told by your health care provider. This is important.   Contact a health care provider if:  Your abdominal pain changes or gets worse.  You are not hungry or you lose weight without trying.  You are constipated or have diarrhea for more than 2-3 days.  You have pain when you urinate or have a bowel movement.  Your abdominal pain wakes you up at night.  Your pain gets worse with meals, after eating, or with certain foods.  You are vomiting and cannot keep anything down.  You have a fever.  You have blood in your urine. Get help right away if:  Your pain does not go away as soon as your health care provider told you to expect.  You cannot stop vomiting.  Your pain is only in areas of the abdomen, such as the right side or the left lower portion of the abdomen. Pain on the right side could be caused by appendicitis.  You have bloody or black stools, or stools that look like tar.  You have severe pain, cramping, or bloating in  your abdomen.  You have signs of dehydration, such as: ? Dark urine, very little urine, or no urine. ? Cracked lips. ? Dry mouth. ? Sunken eyes. ? Sleepiness. ? Weakness.  You have trouble breathing or chest pain. Summary  Often, abdominal pain is not serious and it gets better with no treatment or by being treated at home. However, sometimes abdominal pain is serious.  Watch your condition for any changes.  Take over-the-counter and prescription medicines only as told by your health care provider.  Contact a health care provider if your abdominal pain changes or gets worse.  Get help right away if you have severe pain, cramping, or bloating in your abdomen. This information is not intended to replace advice given to you by your health care provider. Make sure you discuss any questions you have with your health care provider. Document Revised: 05/17/2019 Document Reviewed: 08/06/2018 Elsevier Patient Education  [place].

## 2020-07-13 NOTE — Progress Notes (Signed)
Assessment and Plan:  Emily Nolan was seen today for abdominal pain.  Diagnoses and all orders for this visit:  Right lower quadrant abdominal tenderness with rebound tenderness Need to r/o appendicitis, possible diverticulitis with hx of diverticulosis, also check urine r/o UTI CT abd/pelvis cannot be completed today, pending insurance prior auth to schedule tomorrow AM; will go ahead and start cipro/flagyl to cover diverticulitis or mild appendicitis Stick to fluids/broths tonight, bland, NPO tomorrow AM  Please go to the ER if you have any severe AB pain, unable to hold down food/water, blood in stool or vomit, chest pain, shortness of breath, or any worsening symptoms.  -     CBC with Differential/Platelet -     COMPLETE METABOLIC PANEL WITH GFR -     Urinalysis w microscopic + reflex cultur -     CT Abdomen Pelvis W Contrast; Future -     ciprofloxacin (CIPRO) 500 MG tablet; Take 1 tablet (500 mg total) by mouth 2 (two) times daily for 7 days. -     metroNIDAZOLE (FLAGYL) 500 MG tablet; Take 1 tablet (500 mg total) by mouth 2 (two) times daily for 7 days.   Further disposition pending results of labs. Discussed med's effects and SE's.   Over 30 minutes of exam, counseling, chart review, and critical decision making was performed.   Future Appointments  Date Time Provider [place]  07/20/2020 11:30 AM Liane Comber, NP [facility] None  07/21/2020 11:00 AM [facility] 1 [facility]  05/06/2021 10:00 AM Liane Comber, NP [facility] None    ------------------------------------------------------------------------------------------------------------------   HPI 45 y.o.female with hx of sigmoid diverticulosis, GERD, s/p cholecystectomy 07/2019 presents for evaluation of abdominal pain.   She noted unusual fatigue last Thursday and umbilical/L abdominal dull pain, constant, continued dull pain on Friday, Sat continued then progressive pain that evening, 6/10,  non-radiating, tenderness with pressure. Has been sleeping a lot more, yesterday was back to dull pain, then last night woke Emily Nolan up, Sunday started hurting more in RLQ and feeling nausea, sense of needing to have BM but not moving bowels, now with sense of pelvic pressure. She denies actual constipation, diarrhea. Denies recent blood in stool, had 1 episode a few weeks back. She has not had fever, but did have sense of chills. She reports generalized sense of pelvic pressure since yesterday.   Not sexually active, has had bil fallopian tubes removed.  She reports took ibuprofen yesterday and this seemed to help.  Does have menstrual cycles, but does use tampons. Denies vaginal discharge, perineal discomfort.  Denies urinary character changes, urgency, frequency, hematuria, flank pain.   She has had a history of hematuria, had cysto 10+ years ago, Emily Nolan last CT scan in 02/14/2019 showed bladder wall thickening suggestive of cystitis, followed up with urology with no changes.   CT abd/pelvis 02/14/2019 IMPRESSION: 1. There is thickening of the urinary bladder wall, indicative of a degree of cystitis. No evident renal or ureteral calculus. No hydronephrosis. 2. Cholelithiasis. Gallbladder wall does not appear appreciably thickened by CT. 3. Occasional sigmoid diverticula without diverticulitis. No bowel obstruction. No abscess in the abdomen or pelvis. Appendix appears normal. 4.  Hepatic steatosis. 5.  Small umbilical hernia containing only fat.   Medical History:  has Sleep apnea with hypersomnolence; Depression with anxiety; Hyperlipidemia; Labile hypertension; Obesity (BMI 30.0-34.9); and Fatty liver on their problem list. Surgical History:  She  has a past surgical history that includes Mass excision (Left, 01/14/2016); Dilation and curettage  of uterus (age 9); Essure tubal ligation (Bilateral, Jan or Feb 2012   dr meisinger office); Laparoscopic bilateral salpingectomy (Bilateral,  09/01/2017); Endometrial ablation (N/A, 09/01/2017); Wisdom tooth extraction; Cholecystectomy (N/A, 08/01/2019); and Breast cyst excision (Left). Family History:  Emily Nolan history includes Alcohol abuse in Emily Nolan father; Anxiety disorder in Emily Nolan daughter; Autism in Emily Nolan daughter; Breast cancer in Emily Nolan paternal aunt and paternal aunt; Cancer in Emily Nolan paternal grandmother; Diabetes in Emily Nolan paternal uncle; Heart disease in Emily Nolan father and maternal grandmother; Hyperlipidemia in Emily Nolan father; Hypertension in Emily Nolan father and mother; Stroke in Emily Nolan paternal uncle. Social History:   reports that she has never smoked. She has never used smokeless tobacco. She reports previous alcohol use. She reports that she does not use drugs.  Allergies  Allergen Reactions  . Codeine Nausea And Vomiting  . Azithromycin Nausea And Vomiting  . Chlorhexidine Gluconate Rash    CHG    Current Outpatient Medications on File Prior to Visit  Medication Sig  . ALPRAZolam (XANAX) 0.25 MG tablet TAKE 1/2 TO 1 TABLET ONCE OR TWICE DAILY AS NEEDED FOR ANXIETY ATTACKS (Patient taking differently: Take 0.125-0.25 mg by mouth 2 (two) times daily as needed for anxiety.)  . celecoxib (CELEBREX) 200 MG capsule Take 1 capsule Daily with Food for Pain & Inflammation  . Cholecalciferol (VITAMIN D) 125 MCG (5000 UT) CAPS Take 5,000 Units by mouth daily.  . citalopram (CELEXA) 20 MG tablet Take 1 tablet (20 mg total) by mouth daily.  . colestipol (COLESTID) 1 g tablet Start taking 1 tab daily in the morning for diarrhea; can add second tab in the evening if needed after 2 weeks.  . famotidine (PEPCID) 40 MG tablet TAKE 1 TABLET DAILY FOR HEARTBURN & INDIGESTION  . meclizine (ANTIVERT) 25 MG tablet Take 1/2 to 1 tablet 2 to 3 x /day as needed for Dizziness / Vertigo  . metoprolol succinate (TOPROL-XL) 25 MG 24 hr tablet TAKE 1 TABLET DAILY FOR BP & MIGRAINE PREVENTION  . metroNIDAZOLE (METROCREAM) 0.75 % cream Apply topically 2 (two) times daily.  (Patient taking differently: Apply 1 application topically 2 (two) times daily as needed (rosacea).)  . topiramate (TOPAMAX) 100 MG tablet Take 1 tablet (100 mg total) by mouth at bedtime.  . valACYclovir (VALTREX) 500 MG tablet Take 1 tablet (500 mg total) by mouth 2 (two) times daily. (Patient taking differently: Take 500 mg by mouth 2 (two) times daily as needed (outbreaks).)   No current facility-administered medications on file prior to visit.   Allergies:  Allergies  Allergen Reactions  . Codeine Nausea And Vomiting  . Azithromycin Nausea And Vomiting  . Chlorhexidine Gluconate Rash    CHG    ROS: all negative except above.   Physical Exam:  BP 124/80   Pulse 70   Temp (!) 97.3 F (36.3 C)   Wt 167 lb (75.8 kg)   SpO2 98%   BMI 29.58 kg/m   General Appearance: Well nourished, well dressed female in no apparent distress. Eyes: PERRLA, conjunctiva no swelling or erythema ENT/Mouth:  No erythema, swelling, or exudate on post pharynx.  Tonsils not swollen or erythematous. Hearing normal.  Neck: Supple Respiratory: Respiratory effort normal, BS equal bilaterally without rales, rhonchi, wheezing or stridor.  Cardio: RRR with no MRGs. Brisk peripheral pulses without edema.  Abdomen: Soft, + BS.  Generalized tenderness worst RLQ with rebound tenderness and guarding, no palpable hernias, masses. Lymphatics: Non tender without lymphadenopathy.  Musculoskeletal: normal gait.  Skin: Warm, dry  without rashes, lesions, ecchymosis.  Neuro: Normal muscle tone  Psych: Awake and oriented X 3, normal affect, Insight and Judgment appropriate.     Izora Ribas, NP 4:21 PM [facility]

## 2020-07-14 ENCOUNTER — Ambulatory Visit
Admission: RE | Admit: 2020-07-14 | Discharge: 2020-07-14 | Disposition: A | Discharge status: Home / Self Care | Payer: 59 | Source: Ambulatory Visit | Attending: Adult Health | Admitting: Adult Health

## 2020-07-14 DIAGNOSIS — R10823 Right lower quadrant rebound abdominal tenderness: Secondary | ICD-10-CM

## 2020-07-14 DIAGNOSIS — R1031 Right lower quadrant pain: Secondary | ICD-10-CM

## 2020-07-14 LAB — COMPLETE METABOLIC PANEL WITH GFR
AG Ratio: 1.4 (calc) (ref 1.0–2.5)
ALT: 15 U/L (ref 6–29)
AST: 15 U/L (ref 10–30)
Albumin: 4.2 g/dL (ref 3.6–5.1)
Alkaline phosphatase (APISO): 54 U/L (ref 31–125)
BUN: 8 mg/dL (ref 7–25)
CO2: 22 mmol/L (ref 20–32)
Calcium: 9.2 mg/dL (ref 8.6–10.2)
Chloride: 106 mmol/L (ref 98–110)
Creat: 0.63 mg/dL (ref 0.50–1.10)
GFR, Est African American: 128 mL/min/{1.73_m2} (ref >=60)
GFR, Est Non African American: 111 mL/min/{1.73_m2} (ref >=60)
Globulin: 3 g/dL (calc) (ref 1.9–3.7)
Glucose, Bld: 97 mg/dL (ref 65–99)
Potassium: 3.7 mmol/L (ref 3.5–5.3)
Sodium: 140 mmol/L (ref 135–146)
Total Bilirubin: 0.3 mg/dL (ref 0.2–1.2)
Total Protein: 7.2 g/dL (ref 6.1–8.1)

## 2020-07-14 LAB — URINALYSIS W MICROSCOPIC + REFLEX CULTURE
Bacteria, UA: NONE SEEN /HPF
Bilirubin Urine: NEGATIVE
Glucose, UA: NEGATIVE
Hyaline Cast: NONE SEEN /LPF
Ketones, ur: NEGATIVE
Leukocyte Esterase: NEGATIVE
Nitrites, Initial: NEGATIVE
Protein, ur: NEGATIVE
Specific Gravity, Urine: 1.019 (ref 1.001–1.03)
WBC, UA: NONE SEEN /HPF (ref 0–5)
pH: <=5 (ref 5.0–8.0)

## 2020-07-14 LAB — CBC WITH DIFFERENTIAL/PLATELET
Absolute Monocytes: 465 cells/uL (ref 200–950)
Basophils Absolute: 56 cells/uL (ref 0–200)
Basophils Relative: 0.6 %
Eosinophils Absolute: 214 cells/uL (ref 15–500)
Eosinophils Relative: 2.3 %
HCT: 41.2 % (ref 35.0–45.0)
Hemoglobin: 14.3 g/dL (ref 11.7–15.5)
Lymphs Abs: 2697 cells/uL (ref 850–3900)
MCH: 32.6 pg (ref 27.0–33.0)
MCHC: 34.7 g/dL (ref 32.0–36.0)
MCV: 94.1 fL (ref 80.0–100.0)
MPV: 10.7 fL (ref 7.5–12.5)
Monocytes Relative: 5 %
Neutro Abs: 5868 cells/uL (ref 1500–7800)
Neutrophils Relative %: 63.1 %
Platelets: 240 10*3/uL (ref 140–400)
RBC: 4.38 10*6/uL (ref 3.80–5.10)
RDW: 12.3 % (ref 11.0–15.0)
Total Lymphocyte: 29 %
WBC: 9.3 10*3/uL (ref 3.8–10.8)

## 2020-07-14 LAB — NO CULTURE INDICATED

## 2020-07-14 MED ORDER — IOPAMIDOL (ISOVUE-300) INJECTION 61%
100.0000 mL | Freq: Once | INTRAVENOUS | Status: AC | PRN
  Administered 2020-07-14: 100 mL via INTRAVENOUS

## 2020-07-20 ENCOUNTER — Ambulatory Visit: Payer: 59 | Admitting: Adult Health

## 2020-07-21 ENCOUNTER — Ambulatory Visit
Admission: RE | Admit: 2020-07-21 | Discharge: 2020-07-21 | Disposition: A | Discharge status: Home / Self Care | Payer: 59 | Source: Ambulatory Visit | Attending: Physician Assistant | Admitting: Physician Assistant

## 2020-07-21 ENCOUNTER — Other Ambulatory Visit: Payer: Self-pay

## 2020-07-21 DIAGNOSIS — N6489 Other specified disorders of breast: Secondary | ICD-10-CM

## 2020-08-08 ENCOUNTER — Other Ambulatory Visit: Payer: Self-pay | Admitting: Adult Health

## 2020-08-28 ENCOUNTER — Other Ambulatory Visit: Payer: Self-pay

## 2020-08-28 ENCOUNTER — Encounter: Payer: Self-pay | Admitting: Adult Health

## 2020-08-28 ENCOUNTER — Ambulatory Visit (INDEPENDENT_AMBULATORY_CARE_PROVIDER_SITE_OTHER): Payer: 59 | Admitting: Adult Health

## 2020-08-28 VITALS — BP 122/82 | HR 75 | Temp 97.9°F | Wt 165.0 lb

## 2020-08-28 DIAGNOSIS — G44209 Tension-type headache, unspecified, not intractable: Secondary | ICD-10-CM

## 2020-08-28 DIAGNOSIS — D259 Leiomyoma of uterus, unspecified: Secondary | ICD-10-CM | POA: Insufficient documentation

## 2020-08-28 DIAGNOSIS — F418 Other specified anxiety disorders: Secondary | ICD-10-CM | POA: Diagnosis not present

## 2020-08-28 DIAGNOSIS — G43909 Migraine, unspecified, not intractable, without status migrainosus: Secondary | ICD-10-CM | POA: Diagnosis not present

## 2020-08-28 MED ORDER — FLUOXETINE HCL 20 MG PO CAPS: 20.0000 mg | ORAL_CAPSULE | Freq: Every day | ORAL | 0 refills | Status: DC

## 2020-08-28 NOTE — Patient Instructions (Signed)
Tension Headache, Adult A tension headache is a feeling of pain, pressure, or aching over the front and sides of the head. The pain can be dull, or it can feel tight. There are two types of tension headache:  Episodic tension headache. This is when the headaches happen fewer than 15 days a month.  Chronic tension headache. This is when the headaches happen more than 15 days a month during a 3-month period. A tension headache can last from 30 minutes to several days. It is the most common kind of headache. Tension headaches are not normally associated with nausea or vomiting, and they do not get worse with physical activity. What are the causes? The exact cause of this condition is not known. Tension headaches are often triggered by stress, anxiety, or depression. Other triggers may include:  Alcohol.  Too much caffeine or caffeine withdrawal.  Respiratory infections, such as colds, flu, or sinus infections.  Dental problems or teeth clenching.  Fatigue.  Holding your head and neck in the same position for a long period of time, such as while using a computer.  Smoking.  Arthritis of the neck. What are the signs or symptoms? Symptoms of this condition include:  A feeling of pressure or tightness around the head.  Dull, aching head pain.  Pain over the front and sides of the head.  Tenderness in the muscles of the head, neck, and shoulders. How is this diagnosed? This condition may be diagnosed based on your symptoms, your medical history, and a physical exam. If your symptoms are severe or unusual, you may have imaging tests, such as a CT scan or an MRI of your head. Your vision may also be checked. How is this treated? This condition may be treated with lifestyle changes and with medicines that help relieve symptoms. Follow these instructions at home: Managing pain  Take over-the-counter and prescription medicines only as told by your health care provider.  When you have  a headache, lie down in a dark, quiet room.  If directed, put ice on your head and neck. To do this: ? Put ice in a plastic bag. ? Place a towel between your skin and the bag. ? Leave the ice on for 20 minutes, 2-3 times a day. ? Remove the ice if your skin turns bright red. This is very important. If you cannot feel pain, heat, or cold, you have a greater risk of damage to the area.  If directed, apply heat to the back of your neck as often as told by your health care provider. Use the heat source that your health care provider recommends, such as a moist heat pack or a heating pad. ? Place a towel between your skin and the heat source. ? Leave the heat on for 20-30 minutes. ? Remove the heat if your skin turns bright red. This is especially important if you are unable to feel pain, heat, or cold. You have a greater risk of getting burned. Eating and drinking  Eat meals on a regular schedule.  If you drink alcohol: ? Limit how much you have to:  0-1 drink a day for women who are not pregnant.  0-2 drinks a day for men. ? Know how much alcohol is in your drink. In the [place] one drink equals one 12 oz bottle of beer (355 mL), one 5 oz glass of wine (148 mL), or one 1 oz glass of hard liquor (44 mL).  Drink enough fluid to keep your urine   pale yellow.  Decrease your caffeine intake, or stop using caffeine. Lifestyle  Get 7-9 hours of sleep each night, or get the amount of sleep recommended by your health care provider.  At bedtime, remove computers, phones, and tablets from your room.  Find ways to manage your stress. This may include: ? Exercise. ? Deep breathing exercises. ? Yoga. ? Listening to music. ? Positive mental imagery.  Try to sit up straight and avoid tensing your muscles.  Do not use any products that contain nicotine or tobacco. These include cigarettes, chewing tobacco, and vaping devices, such as e-cigarettes. If you need help quitting, ask your health care  provider. General instructions  Avoid any headache triggers. Keep a journal to help find out what may trigger your headaches. For example, write down: ? What you eat and drink. ? How much sleep you get. ? Any change to your diet or medicines.  Keep all follow-up visits. This is important.   Contact a health care provider if:  Your headache does not get better.  Your headache comes back.  You are sensitive to sounds, light, or smells because of a headache.  You have nausea or you vomit.  Your stomach hurts. Get help right away if:  You suddenly develop a severe headache, along with any of the following: ? A stiff neck. ? Nausea and vomiting. ? Confusion. ? Weakness in one part or one side of your body. ? Double vision or loss of vision. ? Shortness of breath. ? Rash. ? Unusual sleepiness. ? Fever or chills. ? Trouble speaking. ? Pain in your eye or ear. ? Trouble walking or balancing. ? Feeling faint or passing out. Summary  A tension headache is a feeling of pain, pressure, or aching over the front and sides of the head.  A tension headache can last from 30 minutes to several days. It is the most common kind of headache.  This condition may be diagnosed based on your symptoms, your medical history, and a physical exam.  This condition may be treated with lifestyle changes and with medicines that help relieve symptoms. This information is not intended to replace advice given to you by your health care provider. Make sure you discuss any questions you have with your health care provider.     Fluoxetine capsules or tablets (Depression/Mood Disorders) What is this medicine? FLUOXETINE (floo OX e teen) belongs to a class of drugs known as selective serotonin reuptake inhibitors (SSRIs). It helps to treat mood problems such as depression, obsessive compulsive disorder, and panic attacks. It can also treat certain eating disorders. This medicine may be used for other  purposes; ask your health care provider or pharmacist if you have questions. COMMON BRAND NAME(S): Prozac What should I tell my health care provider before I take this medicine? They need to know if you have any of these conditions:  bipolar disorder or a family history of bipolar disorder  bleeding disorders  glaucoma  heart disease  liver disease  low levels of sodium in the blood  seizures  suicidal thoughts, plans, or attempt; a previous suicide attempt by you or a family member  take MAOIs like Carbex, Eldepryl, Marplan, Nardil, and Parnate  take medicines that treat or prevent blood clots  thyroid disease  an unusual or allergic reaction to fluoxetine, other medicines, foods, dyes, or preservatives  pregnant or trying to get pregnant  breast-feeding How should I use this medicine? Take this medicine by mouth with a glass of  water. Follow the directions on the prescription label. You can take this medicine with or without food. Take your medicine at regular intervals. Do not take it more often than directed. Do not stop taking this medicine suddenly except upon the advice of your doctor. Stopping this medicine too quickly may cause serious side effects or your condition may worsen. A special MedGuide will be given to you by the pharmacist with each prescription and refill. Be sure to read this information carefully each time. Talk to your pediatrician regarding the use of this medicine in children. While this drug may be prescribed for children as young as 7 years for selected conditions, precautions do apply. Overdosage: If you think you have taken too much of this medicine contact a poison control center or emergency room at once. NOTE: This medicine is only for you. Do not share this medicine with others. What if I miss a dose? If you miss a dose, skip the missed dose and go back to your regular dosing schedule. Do not take double or extra doses. What may interact with  this medicine? Do not take this medicine with any of the following medications:  other medicines containing fluoxetine, like Sarafem or Symbyax  cisapride  dronedarone  linezolid  MAOIs like Carbex, Eldepryl, Marplan, Nardil, and Parnate  methylene blue (injected into a vein)  pimozide  thioridazine This medicine may also interact with the following medications:  alcohol  amphetamines  aspirin and aspirin-like medicines  carbamazepine  certain medicines for depression, anxiety, or psychotic disturbances  certain medicines for migraine headaches like almotriptan, eletriptan, frovatriptan, naratriptan, rizatriptan, sumatriptan, zolmitriptan  digoxin  diuretics  fentanyl  flecainide  furazolidone  isoniazid  lithium  medicines for sleep  medicines that treat or prevent blood clots like warfarin, enoxaparin, and dalteparin  NSAIDs, medicines for pain and inflammation, like ibuprofen or naproxen  other medicines that prolong the QT interval (an abnormal heart rhythm)  phenytoin  procarbazine  propafenone  rasagiline  ritonavir  supplements like St. John's wort, kava kava, valerian  tramadol  tryptophan  vinblastine This list may not describe all possible interactions. Give your health care provider a list of all the medicines, herbs, non-prescription drugs, or dietary supplements you use. Also tell them if you smoke, drink alcohol, or use illegal drugs. Some items may interact with your medicine. What should I watch for while using this medicine? Tell your doctor if your symptoms do not get better or if they get worse. Visit your doctor or health care professional for regular checks on your progress. Because it may take several weeks to see the full effects of this medicine, it is important to continue your treatment as prescribed by your doctor. Patients and their families should watch out for new or worsening thoughts of suicide or depression.  Also watch out for sudden changes in feelings such as feeling anxious, agitated, panicky, irritable, hostile, aggressive, impulsive, severely restless, overly excited and hyperactive, or not being able to sleep. If this happens, especially at the beginning of treatment or after a change in dose, call your health care professional. Dennis Bast may get drowsy or dizzy. Do not drive, use machinery, or do anything that needs mental alertness until you know how this medicine affects you. Do not stand or sit up quickly, especially if you are an older patient. This reduces the risk of dizzy or fainting spells. Alcohol may interfere with the effect of this medicine. Avoid alcoholic drinks. Your mouth may get dry. Chewing  sugarless gum or sucking hard candy, and drinking plenty of water may help. Contact your doctor if the problem does not go away or is severe. This medicine may affect blood sugar levels. If you have diabetes, check with your doctor or health care professional before you change your diet or the dose of your diabetic medicine. What side effects may I notice from receiving this medicine? Side effects that you should report to your doctor or health care professional as soon as possible:  allergic reactions like skin rash, itching or hives, swelling of the face, lips, or tongue  anxious  black, tarry stools  breathing problems  changes in vision  confusion  elevated mood, decreased need for sleep, racing thoughts, impulsive behavior  eye pain  fast, irregular heartbeat  feeling faint or lightheaded, falls  feeling agitated, angry, or irritable  hallucination, loss of contact with reality  loss of balance or coordination  loss of memory  painful or prolonged erections  restlessness, pacing, inability to keep still  seizures  stiff muscles  suicidal thoughts or other mood changes  trouble sleeping  unusual bleeding or bruising  unusually weak or tired  vomiting Side  effects that usually do not require medical attention (report to your doctor or health care professional if they continue or are bothersome):  change in appetite or weight  change in sex drive or performance  diarrhea  dry mouth  headache  increased sweating  nausea  tremors This list may not describe all possible side effects. Call your doctor for medical advice about side effects. You may report side effects to FDA at 1-800-FDA-1088. Where should I keep my medicine? Keep out of the reach of children. Store at room temperature between 15 and 30 degrees C (59 and 86 degrees F). Throw away any unused medicine after the expiration date. NOTE: This sheet is a summary. It may not cover all possible information. If you have questions about this medicine, talk to your doctor, pharmacist, or health care provider.  2021 Elsevier/Gold Standard (2017-11-16 11:56:53)  Document Revised: 12/26/2019 Document Reviewed: 12/26/2019 Elsevier Patient Education  [place].

## 2020-08-28 NOTE — Progress Notes (Signed)
Assessment and Plan:  Emily Nolan was seen today for follow-up.  Diagnoses and all orders for this visit:  Depression with anxiety Stop celexa; Start new medication as prescribed Stress management techniques discussed, increase water, good sleep hygiene discussed, increase exercise, and increase veggies.  Follow up 1 month, call the office if any new AE's from medications and we will switch them -     FLUoxetine (PROZAC) 20 MG capsule; Take 1 capsule (20 mg total) by mouth daily.  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 Discussed med's effects and SE's.   Over 30 minutes of exam, counseling, chart review, and critical decision making was performed.   Future Appointments  Date Time Provider [place]  05/06/2021 10:00 AM Liane Comber, NP [facility] None    ------------------------------------------------------------------------------------------------------------------   HPI 45 y.o.female presents for 3 month follow up on mood and migraine/headaches.   She is recently separated/divorced, stress going through holidays, 2 daughters and a younger son. Eldest about to graduated high school, recently diagnosed on autism spectrum. Very demanding job as well.   She has long hx of depression/anxiety been seeing a therapist since 2020, she is taking the xanax AS needed 1/2-1 tablet 2-3 x a week. Historically resistant to taking daily agent. Counselor suggests trying a SSRI, she did try celexa  but admits finds it difficult to take consistently at same time, did feel significant benefit with anxiety but still fairly depressed mood. Notes daughter doing well on prozac -   She has hx of frequent headaches; has had migraine - unilateral with vision changes and may have nausea; typical migraine. She has been on topiramate 100 mg with significant improvement. She was advised HA log, forgot to bring, hasn't noted food triggers but but recently having near daily HA in afternoon, symmetrical, tenderness at forehead, hand around head, taking ibuprofen without benefit. Denies neck pain. Has on weekend in addition to at work.    Has sleep apnea, has mouth piece with Dr. Toy Cookey, sleep study 2016.   She had gallstones and fatty liver on the CT, [place] abd 04/2019 showed Cholelithiasis, had cholecystectomy 08/01/2019, reports has had intermittent unpredictable episodes of diarrhea, now on colestid. Recently was evaluated for RLQ/pelvic pain, lab workup and CT abd/pelvis was benign other than uterine fibroids and was recomemnded follow up with established GYN Dr. Willis Modena and reports has upcoming appointment. Pain has since resolved with ibupofen.    Past Medical History:  Diagnosis Date  . Anxiety   . Chronic cholecystitis    with stones  . Diverticulosis of colon 02/14/2019  . Fatty liver 02/14/2019   Per CT 02/2019  . GERD (gastroesophageal reflux disease)   . Headache    migraines  . History of kidney stones   . HSV-2 infection   . Hypertension   . Mild obstructive sleep apnea    study 07-12-2014 in epic,  mild osa w/ hypopnea AHI 13/hr--- 08-25-2017 per pt uses dental device  . Pelvic pain in female   . PONV (postoperative nausea and vomiting)   . Wears glasses  Allergies  Allergen Reactions  . Codeine Nausea And Vomiting  . Azithromycin Nausea And Vomiting  . Chlorhexidine Gluconate Rash    CHG    Current Outpatient Medications on File Prior to Visit  Medication Sig  .  ALPRAZolam (XANAX) 0.25 MG tablet TAKE 1/2 TO 1 TABLET ONCE OR TWICE DAILY AS NEEDED FOR ANXIETY ATTACKS (Patient taking differently: Take 0.125-0.25 mg by mouth 2 (two) times daily as needed for anxiety.)  . celecoxib (CELEBREX) 200 MG capsule Take 1 capsule Daily with Food for Pain & Inflammation  . Cholecalciferol (VITAMIN D) 125 MCG (5000 UT) CAPS Take 5,000 Units by mouth daily.  . colestipol (COLESTID) 1 g tablet Start taking 1 tab daily in the morning for diarrhea; can add second tab in the evening if needed after 2 weeks.  . famotidine (PEPCID) 40 MG tablet TAKE 1 TABLET DAILY FOR HEARTBURN & INDIGESTION  . metoprolol succinate (TOPROL-XL) 25 MG 24 hr tablet TAKE 1 TABLET DAILY FOR BP & MIGRAINE PREVENTION  . metroNIDAZOLE (METROCREAM) 0.75 % cream Apply topically 2 (two) times daily. (Patient taking differently: Apply 1 application topically 2 (two) times daily as needed (rosacea).)  . topiramate (TOPAMAX) 100 MG tablet Take 1 tablet (100 mg total) by mouth at bedtime.  . valACYclovir (VALTREX) 500 MG tablet Take 1 tablet (500 mg total) by mouth 2 (two) times daily. (Patient taking differently: Take 500 mg by mouth 2 (two) times daily as needed (outbreaks).)  . meclizine (ANTIVERT) 25 MG tablet Take 1/2 to 1 tablet 2 to 3 x /day as needed for Dizziness / Vertigo (Patient not taking: Reported on 08/28/2020)   No current facility-administered medications on file prior to visit.    ROS: all negative except above.   Physical Exam:  BP 122/82   Pulse 75   Temp 97.9 F (36.6 C)   Wt 165 lb (74.8 kg)   SpO2 98%   BMI 29.23 kg/m   General Appearance: Well nourished, in no apparent distress. Eyes: PERRLA, EOMs, conjunctiva no swelling or erythema Sinuses: No Frontal/maxillary tenderness ENT/Mouth: Ext aud canals clear, TMs without erythema, bulging. No erythema, swelling, or exudate on post pharynx.  Tonsils not swollen or erythematous. Hearing normal.  Neck: Supple, thyroid normal.   Respiratory: Respiratory effort normal, BS equal bilaterally without rales, rhonchi, wheezing or stridor.  Cardio: RRR with no MRGs. Brisk peripheral pulses without edema.  Abdomen: Soft, + BS.  Non tender, no guarding, rebound, hernias, masses. Lymphatics: Non tender without lymphadenopathy.  Musculoskeletal: Full ROM, 5/5 strength, normal gait. No spinous tenderness, does have muscle tenderness and tension in traps Skin: Warm, dry without rashes, lesions, ecchymosis.  Neuro: Cranial nerves intact. Normal muscle tone, no cerebellar symptoms. Sensation intact.  Psych: Awake and oriented X 3, depressed affect, Insight and Judgment appropriate.     Izora Ribas, NP 11:58 AM [facility]

## 2020-09-16 ENCOUNTER — Other Ambulatory Visit: Payer: Self-pay | Admitting: Adult Health

## 2020-09-16 DIAGNOSIS — E669 Obesity, unspecified: Secondary | ICD-10-CM

## 2020-09-16 DIAGNOSIS — G43909 Migraine, unspecified, not intractable, without status migrainosus: Secondary | ICD-10-CM

## 2020-09-20 ENCOUNTER — Other Ambulatory Visit: Payer: Self-pay | Admitting: Adult Health

## 2020-09-20 DIAGNOSIS — F418 Other specified anxiety disorders: Secondary | ICD-10-CM

## 2020-09-29 ENCOUNTER — Other Ambulatory Visit: Payer: Self-pay | Admitting: Adult Health

## 2020-09-29 MED ORDER — ESCITALOPRAM OXALATE 10 MG PO TABS: ORAL_TABLET | ORAL | 0 refills | Status: DC

## 2020-10-24 ENCOUNTER — Other Ambulatory Visit: Payer: Self-pay | Admitting: Adult Health

## 2020-11-04 NOTE — Progress Notes (Signed)
Assessment and Plan:  Emily Nolan was seen today for acute visit.  Diagnoses and all orders for this visit:  Esophageal dysphagia -     CBC with Differential/Platelet -     Ambulatory referral to Gastroenterology -     esomeprazole (NEXIUM) 40 MG capsule; Take 1 capsule (40 mg total) by mouth daily.  Sleep apnea with hypersomnolence -     TSH       - Continue to follow up Dr. Toy Cookey, continue oral device  Depression with anxiety -     TSH -     Symptoms were not controlled with Lexapro 10 so increase dose to escitalopram (LEXAPRO) 20 MG tablet; Take 1 tablet (20 mg total) by mouth daily.  Abdominal pain, right upper quadrant -     CBC with Differential/Platelet -     COMPLETE METABOLIC PANEL WITH GFR - Unable to determine if hepatomegaly or guarding but definite tenderness.  Will check labs and have GI further evaluate if necessary.  Monitor symptoms and if develop nausea, vomiting, diarrhea or constipation call the office.        Further disposition pending results of labs. Discussed med's effects and SE's.   Over 30 minutes of exam, counseling, chart review, and critical decision making was performed.   Future Appointments  Date Time Provider [place]  05/06/2021 10:00 AM Liane Comber, NP [facility] None    ------------------------------------------------------------------------------------------------------------------   HPI BP 138/86   Pulse 86   Temp (!) 97.5 F (36.4 C)   Ht '5\' 3"'$  (1.6 m)   Wt 170 lb (77.1 kg)   SpO2 98%   BMI 30.11 kg/m  45 y.o.female presents for spasms of esophagus which began Friday with eating food, no symptoms with swallowing liquids.  The episodes have occurred more frequently and are lasting longer.  She will go as long as 5 minutes without having the ability to swallow food.  Has had episodes daily x 6 days.   Depression and anxiety and slightly improved with the use of Lexapro 10 mg but still is having symptoms and would like to  adjust dose if possible. She has used Celexa in past with results but then stopped working. Prozac did not help , anxiety symptoms were much worse.    Sleep apnea is still being evaluated. She has large tonsils and a lot of soft tissue in back of palate. She continues to use CPAP device. Discussing possible surgical removal of some soft tissue.    Past Medical History:  Diagnosis Date   Anxiety    Chronic cholecystitis    with stones   Diverticulosis of colon 02/14/2019   Fatty liver 02/14/2019   Per CT 02/2019   GERD (gastroesophageal reflux disease)    Headache    migraines   History of kidney stones    HSV-2 infection    Hypertension    Mild obstructive sleep apnea    study 07-12-2014 in epic,  mild osa w/ hypopnea AHI 13/hr--- 08-25-2017 per pt uses dental device   Pelvic pain in female    PONV (postoperative nausea and vomiting)    Wears glasses      Allergies  Allergen Reactions   Codeine Nausea And Vomiting   Azithromycin Nausea And Vomiting   Chlorhexidine Gluconate Rash    CHG    Current Outpatient Medications on File Prior to Visit  Medication Sig   ALPRAZolam (XANAX) 0.25 MG tablet TAKE 1/2 TO 1 TABLET ONCE OR TWICE DAILY AS NEEDED FOR  ANXIETY ATTACKS (Patient taking differently: Take 0.125-0.25 mg by mouth 2 (two) times daily as needed for anxiety.)   celecoxib (CELEBREX) 200 MG capsule Take 1 capsule Daily with Food for Pain & Inflammation   Cholecalciferol (VITAMIN D) 125 MCG (5000 UT) CAPS Take 5,000 Units by mouth daily.   famotidine (PEPCID) 40 MG tablet TAKE 1 TABLET DAILY FOR HEARTBURN & INDIGESTION   metoprolol succinate (TOPROL-XL) 25 MG 24 hr tablet TAKE 1 TABLET DAILY FOR BP & MIGRAINE PREVENTION   topiramate (TOPAMAX) 100 MG tablet TAKE 1 TABLET BY MOUTH EVERYDAY AT BEDTIME   valACYclovir (VALTREX) 500 MG tablet Take 1 tablet (500 mg total) by mouth 2 (two) times daily. (Patient taking differently: Take 500 mg by mouth 2 (two) times daily as needed  (outbreaks).)   No current facility-administered medications on file prior to visit.    Review of Systems  Constitutional:  Negative for chills and fever.  HENT:  Positive for sore throat. Negative for ear pain, hearing loss, sinus pain and tinnitus.   Eyes:  Negative for blurred vision and double vision.  Respiratory:  Positive for wheezing. Negative for cough and shortness of breath.   Cardiovascular:  Negative for chest pain, palpitations, orthopnea and leg swelling.  Gastrointestinal:  Positive for heartburn. Negative for abdominal pain, diarrhea, nausea and vomiting.       Dysphagia  Genitourinary:  Negative for dysuria and urgency.  Musculoskeletal:  Negative for back pain, falls, myalgias and neck pain.  Skin:  Negative for rash.  Neurological:  Negative for dizziness, tingling, weakness and headaches.  Endo/Heme/Allergies:  Does not bruise/bleed easily.  Psychiatric/Behavioral:  Positive for depression. The patient is nervous/anxious.     Physical Exam:  BP 138/86   Pulse 86   Temp (!) 97.5 F (36.4 C)   Ht '5\' 3"'$  (1.6 m)   Wt 170 lb (77.1 kg)   SpO2 98%   BMI 30.11 kg/m   General Appearance: Well nourished, in no apparent distress. Eyes: PERRLA, EOMs, conjunctiva no swelling or erythema Sinuses: No Frontal/maxillary tenderness ENT/Mouth: Ext aud canals clear, TMs without erythema, bulging. No erythema, swelling, or exudate on post pharynx.  Large tonsils, more noticeable on right side. Hearing normal.  Neck: Supple, thyroid normal.  Respiratory: Respiratory effort normal, BS equal bilaterally without rales, rhonchi, wheezing or stridor.  Cardio: RRR with no MRGs. Brisk peripheral pulses without edema.  Abdomen: Soft, + BS.  Tenderness noted of RUQ , firm to touch as well.  Unable to determine if guarding or possible hepatomegaly.  Lymphatics: Non tender without lymphadenopathy.  Musculoskeletal: Full ROM, 5/5 strength, normal gait.  Skin: Warm, dry without rashes,  lesions, ecchymosis.  Neuro: Cranial nerves intact. Normal muscle tone, no cerebellar symptoms. Sensation intact.  Psych: Awake and oriented X 3, normal affect, Insight and Judgment appropriate.     Magda Bernheim, NP 11:13 AM [facility]

## 2020-11-05 ENCOUNTER — Encounter: Payer: Self-pay | Admitting: Nurse Practitioner

## 2020-11-05 ENCOUNTER — Other Ambulatory Visit: Payer: Self-pay

## 2020-11-05 ENCOUNTER — Ambulatory Visit (INDEPENDENT_AMBULATORY_CARE_PROVIDER_SITE_OTHER): Payer: 59 | Admitting: Nurse Practitioner

## 2020-11-05 VITALS — BP 138/86 | HR 86 | Temp 97.5°F | Ht 63.0 in | Wt 170.0 lb

## 2020-11-05 DIAGNOSIS — G4710 Hypersomnia, unspecified: Secondary | ICD-10-CM | POA: Diagnosis not present

## 2020-11-05 DIAGNOSIS — R1319 Other dysphagia: Secondary | ICD-10-CM

## 2020-11-05 DIAGNOSIS — F418 Other specified anxiety disorders: Secondary | ICD-10-CM | POA: Diagnosis not present

## 2020-11-05 DIAGNOSIS — G4730 Sleep apnea, unspecified: Secondary | ICD-10-CM

## 2020-11-05 DIAGNOSIS — R1011 Right upper quadrant pain: Secondary | ICD-10-CM | POA: Diagnosis not present

## 2020-11-05 MED ORDER — ESCITALOPRAM OXALATE 20 MG PO TABS
20.0000 mg | ORAL_TABLET | Freq: Every day | ORAL | 0 refills | Status: DC
Start: 2020-11-05 — End: 2020-12-22

## 2020-11-05 MED ORDER — ESOMEPRAZOLE MAGNESIUM 40 MG PO CPDR: 40.0000 mg | DELAYED_RELEASE_CAPSULE | Freq: Every day | ORAL | 0 refills | Status: DC

## 2020-11-05 NOTE — Patient Instructions (Addendum)
I have put in a referral to [facility]. You should hear from them shortly as we are attempting to get as quickly as we can.   Dysphagia  Dysphagia is trouble swallowing. This condition occurs when solids and liquids stick in a person's throat on the way down to the stomach, or when food takeslonger to get to the stomach than usual. You may have problems swallowing food, liquids, or both. You may also have pain while trying to swallow. It may take you more time and effort to swallowsomething. What are the causes? This condition may be caused by: Muscle problems. These may make it difficult for you to move food and liquids through the esophagus, which is the tube that connects your mouth to your stomach. Blockages. You may have ulcers, scar tissue, or inflammation that blocks the normal passage of food and liquids. Causes of these problems include: Acid reflux from your stomach into your esophagus (gastroesophageal reflux). Infections. Radiation treatment for cancer. Medicines taken without enough fluids to wash them down into your stomach. Stroke. This can affect the nerves and make it difficult to swallow. Nerve problems. These prevent signals from being sent to the muscles of your esophagus to squeeze (contract) and move what you swallow down to your stomach. Globus pharyngeus. This is a common problem that involves a feeling like something is stuck in your throat or a sense of trouble with swallowing, even though nothing is wrong with the swallowing passages. Certain conditions, such as cerebral palsy or Parkinson's disease. What are the signs or symptoms? Common symptoms of this condition include: A feeling that solids or liquids are stuck in your throat on the way down to the stomach. Pain while swallowing. Coughing or gagging while trying to swallow. Other symptoms include: Food moving back from your stomach to your mouth (regurgitation). Noises coming from your throat. Chest discomfort  when swallowing. A feeling of fullness when swallowing. Drooling, especially when the throat is blocked. Heartburn. How is this diagnosed? This condition may be diagnosed by: Barium swallow X-ray. In this test, you will swallow a white liquid that sticks to the inside of your esophagus. X-ray images are then taken. Endoscopy. In this test, a flexible telescope is inserted down your throat to look at your esophagus and your stomach. CT scans or an MRI. How is this treated? Treatment for dysphagia depends on the cause of this condition: If the dysphagia is caused by acid reflux or infection, medicines may be used. These may include antibiotics or heartburn medicines. If the dysphagia is caused by problems with the muscles, swallowing therapy may be used to help you strengthen your swallowing muscles. You may have to do specific exercises to strengthen the muscles or stretch them. If the dysphagia is caused by a blockage or mass, procedures to remove the blockage may be done. You may need surgery and a feeding tube. You may need to make diet changes. Ask your health care provider for specificinstructions. Follow these instructions at home: Medicines Take over-the-counter and prescription medicines only as told by your health care provider. If you were prescribed an antibiotic medicine, take it as told by your health care provider. Do not stop taking the antibiotic even if you start to feel better. Eating and drinking  Make any diet changes as told by your health care provider. Work with a diet and nutrition specialist (dietitian) to create an eating plan that will help you get the nutrients you need in order to stay healthy. Eat  soft foods that are easier to swallow. Cut your food into small pieces and eat slowly. Take small bites. Eat and drink only when you are sitting upright. Do not drink alcohol or caffeine. If you need help quitting, ask your health care provider.  General  instructions Check your weight every day to make sure you are not losing weight. Do not use any products that contain nicotine or tobacco. These products include cigarettes, chewing tobacco, and vaping devices, such as e-cigarettes. If you need help quitting, ask your health care provider. Keep all follow-up visits. This is important. Contact a health care provider if: You lose weight because you cannot swallow. You cough when you drink liquids. You cough up partially digested food. Get help right away if: You cannot swallow your saliva. You have shortness of breath, a fever, or both. Your voice is hoarse and you have trouble swallowing. These symptoms may represent a serious problem that is an emergency. Do not wait to see if the symptoms will go away. Get medical help right away. Call your local emergency services (911 in the [place] Do not drive yourself to the hospital. Summary Dysphagia is trouble swallowing. This condition occurs when solids and liquids stick in a person's throat on the way down to the stomach. You may cough or gag while trying to swallow. Dysphagia has many possible causes. Treatment for dysphagia depends on the cause of the condition. Keep all follow-up visits. This is important. This information is not intended to replace advice given to you by your health care provider. Make sure you discuss any questions you have with your healthcare provider. Document Revised: 11/16/2019 Document Reviewed: 11/16/2019 Elsevier Patient Education  [place].

## 2020-11-06 ENCOUNTER — Encounter: Payer: Self-pay | Admitting: Gastroenterology

## 2020-11-06 LAB — COMPLETE METABOLIC PANEL WITH GFR
AG Ratio: 1.3 (calc) (ref 1.0–2.5)
ALT: 28 U/L (ref 6–29)
AST: 18 U/L (ref 10–30)
Albumin: 4.4 g/dL (ref 3.6–5.1)
Alkaline phosphatase (APISO): 57 U/L (ref 31–125)
BUN/Creatinine Ratio: 12 (calc) (ref 6–22)
BUN: 6 mg/dL — ABNORMAL LOW (ref 7–25)
CO2: 21 mmol/L (ref 20–32)
Calcium: 8.8 mg/dL (ref 8.6–10.2)
Chloride: 106 mmol/L (ref 98–110)
Creat: 0.51 mg/dL (ref 0.50–0.99)
Globulin: 3.3 g/dL (calc) (ref 1.9–3.7)
Glucose, Bld: 102 mg/dL — ABNORMAL HIGH (ref 65–99)
Potassium: 3.7 mmol/L (ref 3.5–5.3)
Sodium: 138 mmol/L (ref 135–146)
Total Bilirubin: 0.4 mg/dL (ref 0.2–1.2)
Total Protein: 7.7 g/dL (ref 6.1–8.1)
eGFR: 119 mL/min/{1.73_m2} (ref >=60)

## 2020-11-06 LAB — CBC WITH DIFFERENTIAL/PLATELET
Absolute Monocytes: 576 cells/uL (ref 200–950)
Basophils Absolute: 29 cells/uL (ref 0–200)
Basophils Relative: 0.3 %
Eosinophils Absolute: 48 cells/uL (ref 15–500)
Eosinophils Relative: 0.5 %
HCT: 43.6 % (ref 35.0–45.0)
Hemoglobin: 15.2 g/dL (ref 11.7–15.5)
Lymphs Abs: 1987 cells/uL (ref 850–3900)
MCH: 33.4 pg — ABNORMAL HIGH (ref 27.0–33.0)
MCHC: 34.9 g/dL (ref 32.0–36.0)
MCV: 95.8 fL (ref 80.0–100.0)
MPV: 10.3 fL (ref 7.5–12.5)
Monocytes Relative: 6 %
Neutro Abs: 6960 cells/uL (ref 1500–7800)
Neutrophils Relative %: 72.5 %
Platelets: 248 10*3/uL (ref 140–400)
RBC: 4.55 10*6/uL (ref 3.80–5.10)
RDW: 12.6 % (ref 11.0–15.0)
Total Lymphocyte: 20.7 %
WBC: 9.6 10*3/uL (ref 3.8–10.8)

## 2020-11-06 LAB — TSH: TSH: 1.67 mIU/L

## 2020-12-21 ENCOUNTER — Other Ambulatory Visit: Payer: Self-pay | Admitting: Nurse Practitioner

## 2020-12-21 DIAGNOSIS — F418 Other specified anxiety disorders: Secondary | ICD-10-CM

## 2020-12-22 ENCOUNTER — Encounter: Payer: Self-pay | Admitting: Gastroenterology

## 2020-12-22 ENCOUNTER — Ambulatory Visit (INDEPENDENT_AMBULATORY_CARE_PROVIDER_SITE_OTHER): Payer: 59 | Admitting: Gastroenterology

## 2020-12-22 VITALS — BP 120/78 | HR 64 | Ht 63.0 in | Wt 174.1 lb

## 2020-12-22 DIAGNOSIS — R079 Chest pain, unspecified: Secondary | ICD-10-CM | POA: Diagnosis not present

## 2020-12-22 DIAGNOSIS — R1310 Dysphagia, unspecified: Secondary | ICD-10-CM | POA: Diagnosis not present

## 2020-12-22 DIAGNOSIS — K224 Dyskinesia of esophagus: Secondary | ICD-10-CM | POA: Diagnosis not present

## 2020-12-22 DIAGNOSIS — R1011 Right upper quadrant pain: Secondary | ICD-10-CM | POA: Diagnosis not present

## 2020-12-22 MED ORDER — FAMOTIDINE 40 MG PO TABS: 40.0000 mg | ORAL_TABLET | Freq: Every day | ORAL | 3 refills | Status: AC

## 2020-12-22 MED ORDER — PANTOPRAZOLE SODIUM 40 MG PO TBEC: 40.0000 mg | DELAYED_RELEASE_TABLET | Freq: Every day | ORAL | 3 refills | Status: DC

## 2020-12-22 NOTE — Patient Instructions (Addendum)
It was my pleasure to provide care to you today. Based on our discussion, I am providing you with my recommendations below:  RECOMMENDATION(S):   PRESCRIPTION MEDICATION(S):   We have sent the following medication(s) to your pharmacy:  Pepcid and Pantoprazole  NOTE: If your medication(s) requires a PRIOR AUTHORIZATION, we will receive notification from your pharmacy. Once received, the process to submit for approval may take up to 7-10 business days. You will be contacted about any denials we have received from your insurance company as well as alternatives recommended by your provider.  ENDOSCOPY:   You have been scheduled for an endoscopy. Please follow written instructions given to you at your visit today.  INHALERS:   If you use inhalers (even only as needed), please bring them with you on the day of your procedure.  MEDICATIONS TO HOLD:  FOLLOW UP:  After your procedure, you will receive a call from my office staff regarding my recommendation for follow up.  BMI:  If you are age 64 or younger, your body mass index should be between 19-25. Your Body mass index is 30.84 kg/m. If this is out of the aformentioned range listed, please consider follow up with your Primary Care Provider.   MY CHART:  The  GI providers would like to encourage you to use [facility] to communicate with providers for non-urgent requests or questions.  Due to long hold times on the telephone, sending your provider a message by [facility] may be a faster and more efficient way to get a response.  Please allow 48 business hours for a response.  Please remember that this is for non-urgent requests.   Thank you for trusting me with your gastrointestinal care!    Th[place]ton [place], MD, MPH

## 2020-12-22 NOTE — Progress Notes (Signed)
Referring Provider: Unk Pinto, MD Primary Care Physician:  Unk Pinto, MD  Reason for Consultation:  Trouble swallowing               IMPRESSION:  Dysphagia to solids at the sternal notch Frequent sore throat Intermittent dysphonia  Suspected esophageal dysphagia, however, oropharyngeal sources must be considered given concurrent symptoms if esophageal evaluation is negative.   PLAN: EGD with possible biopsies and possible dilation Continue Pepcid 40 mg QHS Add pantoprazole 40 mg QAM Consider evaluation for oropharyngeal dysphagia if this evaluation is negative   HPI: Emily Nolan is a 45 y.o. female referred by Marta Lamas for further evaluation of esophageal dysphagia.  The history is obtained through the patient, records provided by nurse practitioner Demetra Shiner, and review of her electronic health record.  She has sleep apnea with hypersomnolence, depression with anxiety, reflux, sigmoid diverticulosis, and she had a cholecystectomy 07/2019 for gallstones.  Several months of intermittent dysphagia, but, has been increasing in frequency. Feels like the food sticks at the sternal notch. One severe episode while she was eating out that required regurgitation. Frequently feels like she is having esophageal spasms.  No associated coughing or choking. No neck pain. More frequent sore throat and noted dysphonia. Frequent throat clearing. Occurring up to 6 times daily. Weight stable.   Associated right upper quadrant pain. No change in bowel habits.   Symptoms are similar to those that ultimately led to cholecystectomy. Now with urgent defecation.   Avoids dairy and caffeine.   Trial of Nexium recommended but the script ws not filled due to a mixup at the pharmacy.   Mid to lower abdominal pain occurred in April.  But a CT of the abdomen and pelvis for right lower quadrant abdominal pain in April.  Was treated with empiric Cipro and Flagyl. Those symptoms were different to  what she is experiencing now.   Abdominal imaging: CT of the abdomen pelvis 07/14/2020: No findings to explain her abdominal pain, prior colitis activity, uterine fibroids  Labs 11/05/20: normal CMP except for glucose 102, normal CBC, normal TSH  Past Medical History:  Diagnosis Date   Anxiety    Chronic cholecystitis    with stones   Diverticulosis of colon 02/14/2019   Fatty liver 02/14/2019   Per CT 02/2019   GERD (gastroesophageal reflux disease)    Headache    migraines   History of kidney stones    HSV-2 infection    Hypertension    Mild obstructive sleep apnea    study 07-12-2014 in epic,  mild osa w/ hypopnea AHI 13/hr--- 08-25-2017 per pt uses dental device   Pelvic pain in female    PONV (postoperative nausea and vomiting)    Wears glasses     Past Surgical History:  Procedure Laterality Date   BREAST CYST EXCISION Left    2016?   CHOLECYSTECTOMY N/A 08/01/2019   Procedure: LAPAROSCOPIC CHOLECYSTECTOMY;  Surgeon: Stark Klein, MD;  Location: [place];  Service: General;  Laterality: N/A;   DILATION AND CURETTAGE OF UTERUS  age 79   w/ suction for miscarriage   ENDOMETRIAL ABLATION N/A 09/01/2017   Procedure: ENDOMETRIAL ABLATION;  Surgeon: Cheri Fowler, MD;  Location: [place];  Service: Gynecology;  Laterality: N/A;  USING MINERVA   ESSURE TUBAL LIGATION Bilateral Jan or Feb 2012   dr meisinger office   LAPAROSCOPIC BILATERAL SALPINGECTOMY Bilateral 09/01/2017   Procedure: LAPAROSCOPIC BILATERAL SALPINGECTOMY;  Surgeon: Cheri Fowler, MD;  Location: [place];  Service: Gynecology;  Laterality: Bilateral;  DR request RNFA   MASS EXCISION Left 01/14/2016   Procedure: EXCISION OF LEFT BREAST MASS;  Surgeon: Stark Klein, MD;  Location: [place];  Service: General;  Laterality: Left;   WISDOM TOOTH EXTRACTION      Prior to Admission medications   Medication Sig Start Date End Date Taking? Authorizing Provider  ALPRAZolam (XANAX) 0.25  MG tablet TAKE 1/2 TO 1 TABLET ONCE OR TWICE DAILY AS NEEDED FOR ANXIETY ATTACKS Patient taking differently: Take 0.125-0.25 mg by mouth 2 (two) times daily as needed for anxiety. 04/29/19   Vladimir Crofts, PA-C  celecoxib (CELEBREX) 200 MG capsule Take 1 capsule Daily with Food for Pain & Inflammation 05/16/19   Unk Pinto, MD  Cholecalciferol (VITAMIN D) 125 MCG (5000 UT) CAPS Take 5,000 Units by mouth daily.    [provider]  escitalopram (LEXAPRO) 20 MG tablet TAKE 1 TABLET BY MOUTH EVERY DAY 12/22/20   Magda Bernheim, NP  esomeprazole (NEXIUM) 40 MG capsule Take 1 capsule (40 mg total) by mouth daily. 11/05/20 12/05/20  Magda Bernheim, NP  famotidine (PEPCID) 40 MG tablet TAKE 1 TABLET DAILY FOR HEARTBURN & INDIGESTION 05/30/20   Liane Comber, NP  metoprolol succinate (TOPROL-XL) 25 MG 24 hr tablet TAKE 1 TABLET DAILY FOR BP & MIGRAINE PREVENTION 05/30/20   Liane Comber, NP  topiramate (TOPAMAX) 100 MG tablet TAKE 1 TABLET BY MOUTH EVERYDAY AT BEDTIME 09/16/20   Liane Comber, NP  valACYclovir (VALTREX) 500 MG tablet Take 1 tablet (500 mg total) by mouth 2 (two) times daily. Patient taking differently: Take 500 mg by mouth 2 (two) times daily as needed (outbreaks). 05/04/16   Rolene Course, PA-C    Current Outpatient Medications  Medication Sig Dispense Refill   ALPRAZolam (XANAX) 0.25 MG tablet TAKE 1/2 TO 1 TABLET ONCE OR TWICE DAILY AS NEEDED FOR ANXIETY ATTACKS (Patient taking differently: Take 0.125-0.25 mg by mouth 2 (two) times daily as needed for anxiety.) 60 tablet 0   Cholecalciferol (VITAMIN D) 125 MCG (5000 UT) CAPS Take 5,000 Units by mouth daily.     escitalopram (LEXAPRO) 20 MG tablet TAKE 1 TABLET BY MOUTH EVERY DAY 30 tablet 0   famotidine (PEPCID) 40 MG tablet TAKE 1 TABLET DAILY FOR HEARTBURN & INDIGESTION 30 tablet 11   metoprolol succinate (TOPROL-XL) 25 MG 24 hr tablet TAKE 1 TABLET DAILY FOR BP & MIGRAINE PREVENTION 30 tablet 5   topiramate (TOPAMAX) 100  MG tablet TAKE 1 TABLET BY MOUTH EVERYDAY AT BEDTIME 90 tablet 3   valACYclovir (VALTREX) 500 MG tablet Take 1 tablet (500 mg total) by mouth 2 (two) times daily. (Patient taking differently: Take 500 mg by mouth 2 (two) times daily as needed (outbreaks). As needed) 60 tablet 1   No current facility-administered medications for this visit.    Allergies as of 12/22/2020 - Review Complete 12/22/2020  Allergen Reaction Noted   Codeine Nausea And Vomiting 08/25/2017   Azithromycin Nausea And Vomiting 01/25/2013   Chlorhexidine gluconate Rash 01/14/2016    Family History  Problem Relation Age of Onset   Hypertension Mother    Hypertension Father    Heart disease Father    Alcohol abuse Father    Hyperlipidemia Father    Stroke Paternal Uncle    Diabetes Paternal Uncle    Heart disease Maternal Grandmother    Breast cancer Paternal Aunt    Breast cancer Paternal Aunt  Cancer Paternal Grandmother        abdominal - unsure what    Autism Daughter    Anxiety disorder Daughter      Review of Systems: 12 system ROS is negative except as noted above.   Physical Exam: General:   Alert,  well-nourished, pleasant and cooperative in NAD Head:  Normocephalic and atraumatic. Eyes:  Sclera clear, no icterus.   Conjunctiva pink. Abdomen:  Soft, nontender, nondistended, normal bowel sounds, no rebound or guarding. No hepatosplenomegaly.   Rectal:  Deferred  Msk:  Symmetrical. No boney deformities LAD: No inguinal or umbilical LAD Extremities:  No clubbing or edema. Neurologic:  Alert and  oriented x4;  grossly nonfocal Skin:  Intact without significant lesions or rashes. Psych:  Alert and cooperative. Normal mood and affect.    Crimson Beer L. Tarri Glenn, MD, MPH 12/22/2020, 9:49 AM

## 2021-01-04 ENCOUNTER — Ambulatory Visit (AMBULATORY_SURGERY_CENTER): Payer: 59 | Admitting: Gastroenterology

## 2021-01-04 ENCOUNTER — Encounter: Payer: Self-pay | Admitting: Gastroenterology

## 2021-01-04 ENCOUNTER — Other Ambulatory Visit: Payer: Self-pay

## 2021-01-04 ENCOUNTER — Telehealth: Payer: Self-pay

## 2021-01-04 VITALS — BP 148/98 | HR 71 | Temp 98.9°F | Resp 10 | Ht 63.0 in | Wt 174.0 lb

## 2021-01-04 DIAGNOSIS — K2100 Gastro-esophageal reflux disease with esophagitis, without bleeding: Secondary | ICD-10-CM

## 2021-01-04 DIAGNOSIS — R1310 Dysphagia, unspecified: Secondary | ICD-10-CM

## 2021-01-04 DIAGNOSIS — K319 Disease of stomach and duodenum, unspecified: Secondary | ICD-10-CM | POA: Diagnosis not present

## 2021-01-04 DIAGNOSIS — K259 Gastric ulcer, unspecified as acute or chronic, without hemorrhage or perforation: Secondary | ICD-10-CM | POA: Diagnosis not present

## 2021-01-04 DIAGNOSIS — K219 Gastro-esophageal reflux disease without esophagitis: Secondary | ICD-10-CM | POA: Diagnosis not present

## 2021-01-04 MED ORDER — SODIUM CHLORIDE 0.9 % IV SOLN: 500.0000 mL | Freq: Once | INTRAVENOUS | Status: DC

## 2021-01-04 NOTE — Telephone Encounter (Signed)
Pt scheduled as indicated below. Letter mailed:  Appointment Information  Name: Mattison, Golay MRN: [id number]  Date: 02/23/2021 Status: Sch  Time: 11:10 AM Length: 20  Visit Type: FOLLOW UP 20 [336] Copay: $0.00  Provider: Thornton Park, MD Department: [facility]  Referring Provider: Unk Pinto CSN: [id number]  Notes: s/p EGD 9/26; 6-8 week f/u/ae  Made On: Change Notes: 01/04/2021 11:16 AM 01/04/2021 11:16 AM By: By: Cheral Bay, Harinder Romas J

## 2021-01-04 NOTE — Progress Notes (Signed)
Called to room to assist during endoscopic procedure.  Patient ID and intended procedure confirmed with present staff. Received instructions for my participation in the procedure from the performing physician.  

## 2021-01-04 NOTE — Op Note (Addendum)
[place] Patient Name: Jaiyanna Safran Procedure Date: 01/04/2021 9:40 AM MRN: [id number] Endoscopist: Thornton Park MD, MD Age: 45 Referring MD:  Date of Birth: 04-29-1975 Gender: Female Account #: [id number] Procedure:                Upper GI endoscopy Indications:              Dysphagia?to solids at the sternal notch                           Frequent sore throat                           Intermittent dysphonia Medicines:                Monitored Anesthesia Care Procedure:                Pre-Anesthesia Assessment:                           - Prior to the procedure, a History and Physical                            was performed, and patient medications and                            allergies were reviewed. The patient's tolerance of                            previous anesthesia was also reviewed. The risks                            and benefits of the procedure and the sedation                            options and risks were discussed with the patient.                            All questions were answered, and informed consent                            was obtained. Prior Anticoagulants: The patient has                            taken no previous anticoagulant or antiplatelet                            agents. ASA Grade Assessment: II - A patient with                            mild systemic disease. After reviewing the risks                            and benefits, the patient was deemed in  satisfactory condition to undergo the procedure.                           After obtaining informed consent, the endoscope was                            passed under direct vision. Throughout the                            procedure, the patient's blood pressure, pulse, and                            oxygen saturations were monitored continuously. The                            GIF [id number] #[id number] was introduced through the                             mouth, and advanced to the third part of duodenum.                            The upper GI endoscopy was accomplished without                            difficulty. The patient tolerated the procedure                            well. Scope In: Scope Out: Findings:                 LA Grade A (one or more mucosal breaks less than 5                            mm, not extending between tops of 2 mucosal folds)                            esophagitis with no bleeding was found. No ring,                            web, or stricture identified. Biopsies were taken                            from the mid/proximal and distal esophagus with a                            cold forceps for histology. Estimated blood loss                            was minimal.                           Multiple small erosions with no bleeding and no  stigmata of recent bleeding were found in the                            gastric antrum and in the prepyloric region of the                            stomach. Biopsies were taken from the antrum, body,                            and fundus with a cold forceps for histology.                            Estimated blood loss was minimal.                           The examined duodenum was normal. Complications:            No immediate complications. Estimated blood loss:                            Minimal. Estimated Blood Loss:     Estimated blood loss was minimal. Impression:               - LA Grade A reflux esophagitis with no bleeding.                            Biopsied.                           - Erosive gastropathy with no bleeding and no                            stigmata of recent bleeding. Biopsied.                           - Normal examined duodenum. Recommendation:           - Patient has a contact number available for                            emergencies. The signs and symptoms of potential                            delayed  complications were discussed with the                            patient. Return to normal activities tomorrow.                            Written discharge instructions were provided to the                            patient.                           - Resume previous diet.                           -  Continue present medications including Pepcid 40                            mg QHS.                           - Increase pantoprazole to 40 mg BID for at least 8                            weeks, then reduce to once daily dosing.                           - Avoid all NSAIDs.                           - Await pathology results.                           - Office follow-up in 6-8 weeks, earlier if needed. Thornton Park MD, MD 01/04/2021 10:03:41 AM This report has been signed electronically.

## 2021-01-04 NOTE — Progress Notes (Signed)
Pt in recovery with monitors in place, VSS. Report given to receiving RN. Bite guard was placed with pt awake to ensure comfort. No dental or soft tissue damage noted. RN will remove the guard when the pt is awake.  

## 2021-01-04 NOTE — Progress Notes (Signed)
Pt's states no medical or surgical changes since previsit or office visit. VS assessed by D.T 

## 2021-01-04 NOTE — Progress Notes (Signed)
Referring Provider: Unk Pinto, MD Primary Care Physician:  Unk Pinto, MD  Reason for Procedure:  Dysphagia   IMPRESSION:  Dysphagia to solids at the sternal notch Frequent sore throat Intermittent dysphonia Appropriate candidate for monitored anesthesia care  PLAN: EGD in the [place] today   HPI: Emily Nolan is a 45 y.o. female presents for upper endoscopy to evaluate dysphagia. Please see my outpatient consultation note from 12/22/20 for full details.   Past Medical History:  Diagnosis Date   Anxiety    Chronic cholecystitis    with stones   Diverticulosis of colon 02/14/2019   Fatty liver 02/14/2019   Per CT 02/2019   GERD (gastroesophageal reflux disease)    Headache    migraines   History of kidney stones    HSV-2 infection    Hypertension    Mild obstructive sleep apnea    study 07-12-2014 in epic,  mild osa w/ hypopnea AHI 13/hr--- 08-25-2017 per pt uses dental device   Pelvic pain in female    PONV (postoperative nausea and vomiting)    Wears glasses     Past Surgical History:  Procedure Laterality Date   BREAST CYST EXCISION Left    2016?   CHOLECYSTECTOMY N/A 08/01/2019   Procedure: LAPAROSCOPIC CHOLECYSTECTOMY;  Surgeon: Stark Klein, MD;  Location: [place];  Service: General;  Laterality: N/A;   DILATION AND CURETTAGE OF UTERUS  age 65   w/ suction for miscarriage   ENDOMETRIAL ABLATION N/A 09/01/2017   Procedure: ENDOMETRIAL ABLATION;  Surgeon: Cheri Fowler, MD;  Location: [place];  Service: Gynecology;  Laterality: N/A;  USING MINERVA   ESSURE TUBAL LIGATION Bilateral Jan or Feb 2012   dr meisinger office   LAPAROSCOPIC BILATERAL SALPINGECTOMY Bilateral 09/01/2017   Procedure: LAPAROSCOPIC BILATERAL SALPINGECTOMY;  Surgeon: Cheri Fowler, MD;  Location: [place];  Service: Gynecology;  Laterality: Bilateral;  DR request RNFA   MASS EXCISION Left 01/14/2016   Procedure: EXCISION OF LEFT BREAST  MASS;  Surgeon: Stark Klein, MD;  Location: [place];  Service: General;  Laterality: Left;   WISDOM TOOTH EXTRACTION      Current Outpatient Medications  Medication Sig Dispense Refill   ALPRAZolam (XANAX) 0.25 MG tablet TAKE 1/2 TO 1 TABLET ONCE OR TWICE DAILY AS NEEDED FOR ANXIETY ATTACKS (Patient taking differently: Take 0.125-0.25 mg by mouth 2 (two) times daily as needed for anxiety.) 60 tablet 0   Cholecalciferol (VITAMIN D) 125 MCG (5000 UT) CAPS Take 5,000 Units by mouth daily.     escitalopram (LEXAPRO) 20 MG tablet TAKE 1 TABLET BY MOUTH EVERY DAY 30 tablet 0   famotidine (PEPCID) 40 MG tablet Take 1 tablet (40 mg total) by mouth at bedtime. 90 tablet 3   metoprolol succinate (TOPROL-XL) 25 MG 24 hr tablet TAKE 1 TABLET DAILY FOR BP & MIGRAINE PREVENTION 30 tablet 5   pantoprazole (PROTONIX) 40 MG tablet Take 1 tablet (40 mg total) by mouth daily. 90 tablet 3   topiramate (TOPAMAX) 100 MG tablet TAKE 1 TABLET BY MOUTH EVERYDAY AT BEDTIME 90 tablet 3   valACYclovir (VALTREX) 500 MG tablet Take 1 tablet (500 mg total) by mouth 2 (two) times daily. (Patient taking differently: Take 500 mg by mouth 2 (two) times daily as needed (outbreaks). As needed) 60 tablet 1   Current Facility-Administered Medications  Medication Dose Route Frequency Provider Last Rate Last Admin   0.9 %  sodium chloride infusion  500 mL Intravenous Once  Thornton Park, MD        Allergies as of 01/04/2021 - Review Complete 01/04/2021  Allergen Reaction Noted   Codeine Nausea And Vomiting 08/25/2017   Azithromycin Nausea And Vomiting 01/25/2013   Chlorhexidine gluconate Rash 01/14/2016    Family History  Problem Relation Age of Onset   Hypertension Mother    Hypertension Father    Heart disease Father    Alcohol abuse Father    Hyperlipidemia Father    Stroke Paternal Uncle    Diabetes Paternal Uncle    Heart disease Maternal Grandmother    Breast cancer Paternal Aunt    Breast cancer Paternal Aunt     Cancer Paternal Grandmother        abdominal - unsure what    Autism Daughter    Anxiety disorder Daughter      Physical Exam: General:   Alert,  well-nourished, pleasant and cooperative in NAD Head:  Normocephalic and atraumatic. Eyes:  Sclera clear, no icterus.   Conjunctiva pink. Mouth:  No deformity or lesions.   Neck:  Supple; no masses or thyromegaly. Lungs:  Clear throughout to auscultation.   No wheezes. Heart:  Regular rate and rhythm; no murmurs. Abdomen:  Soft, non-tender, nondistended, normal bowel sounds, no rebound or guarding.  Msk:  Symmetrical. No boney deformities LAD: No inguinal or umbilical LAD Extremities:  No clubbing or edema. Neurologic:  Alert and  oriented x4;  grossly nonfocal Skin:  No obvious rash or bruise. Psych:  Alert and cooperative. Normal mood and affect.     Studies/Results: No results found.    Renee Beale L. Tarri Glenn, MD, MPH 01/04/2021, 9:39 AM

## 2021-01-04 NOTE — Patient Instructions (Addendum)
Handout on esophagitis given to patient. Await pathology results.  Avoid all NSAIDS (aspirin, ibuprofen, naproxen, or other non-steriodal anti-inflammatory drugs) Continue present medications but increase pantoprazole to 40 mg twice a day for at least 8 weeks, then reduce back to once daily. Office follow-up in 6-8 weeks, earlier if needed.    YOU HAD AN ENDOSCOPIC PROCEDURE TODAY AT [facility]:   Refer to the procedure report that was given to you for any specific questions about what was found during the examination.  If the procedure report does not answer your questions, please call your gastroenterologist to clarify.  If you requested that your care partner not be given the details of your procedure findings, then the procedure report has been included in a sealed envelope for you to review at your convenience later.  YOU SHOULD EXPECT: Some feelings of bloating in the abdomen. Passage of more gas than usual.  Walking can help get rid of the air that was put into your GI tract during the procedure and reduce the bloating. If you had a lower endoscopy (such as a colonoscopy or flexible sigmoidoscopy) you may notice spotting of blood in your stool or on the toilet paper. If you underwent a bowel prep for your procedure, you may not have a normal bowel movement for a few days.  Please Note:  You might notice some irritation and congestion in your nose or some drainage.  This is from the oxygen used during your procedure.  There is no need for concern and it should clear up in a day or so.  SYMPTOMS TO REPORT IMMEDIATELY:   Following upper endoscopy (EGD)  Vomiting of blood or coffee ground material  New chest pain or pain under the shoulder blades  Painful or persistently difficult swallowing  New shortness of breath  Fever of 100F or higher  Black, tarry-looking stools  For urgent or emergent issues, a gastroenterologist can be reached at any hour by calling [phone]  [phone]. Do not use MyChart messaging for urgent concerns.    DIET:  We do recommend a small meal at first, but then you may proceed to your regular diet.  Drink plenty of fluids but you should avoid alcoholic beverages for 24 hours.  ACTIVITY:  You should plan to take it easy for the rest of today and you should NOT DRIVE or use heavy machinery until tomorrow (because of the sedation medicines used during the test).    FOLLOW UP: Our staff will call the number listed on your records 48-72 hours following your procedure to check on you and address any questions or concerns that you may have regarding the information given to you following your procedure. If we do not reach you, we will leave a message.  We will attempt to reach you two times.  During this call, we will ask if you have developed any symptoms of COVID 19. If you develop any symptoms (ie: fever, flu-like symptoms, shortness of breath, cough etc.) before then, please call [phone].  If you test positive for Covid 19 in the 2 weeks post procedure, please call and report this information to [place].    If any biopsies were taken you will be contacted by phone or by letter within the next 1-3 weeks.  Please call us at [phone] if you have not heard about the biopsies in 3 weeks.    SIGNATURES/CONFIDENTIALITY: You and/or your care partner have signed paperwork which will be entered into your electronic  medical record.  These signatures attest to the fact that that the information above on your After Visit Summary has been reviewed and is understood.  Full responsibility of the confidentiality of this discharge information lies with you and/or your care-partner.  

## 2021-01-04 NOTE — Telephone Encounter (Signed)
-----   Message from Thornton Park, MD sent at 01/04/2021 10:03 AM EDT ----- Office follow-up in 6-8 weeks. Thanks.  KLB

## 2021-01-05 ENCOUNTER — Other Ambulatory Visit: Payer: Self-pay

## 2021-01-05 DIAGNOSIS — R1310 Dysphagia, unspecified: Secondary | ICD-10-CM

## 2021-01-05 MED ORDER — PANTOPRAZOLE SODIUM 40 MG PO TBEC: 40.0000 mg | DELAYED_RELEASE_TABLET | Freq: Two times a day (BID) | ORAL | 3 refills | Status: DC

## 2021-01-06 ENCOUNTER — Telehealth: Payer: Self-pay | Admitting: *Deleted

## 2021-01-06 ENCOUNTER — Telehealth: Payer: Self-pay

## 2021-01-06 NOTE — Telephone Encounter (Signed)
Left message on answering machine. 

## 2021-01-06 NOTE — Telephone Encounter (Signed)
Attempted 2nd f/u phone call. No answer. Left message.  °

## 2021-01-06 NOTE — Telephone Encounter (Signed)
  Follow up Call-  Call back number 01/04/2021  Post procedure Call Back phone  # [phone]  Permission to leave phone message Yes  Some recent data might be hidden     Patient questions:  Do you have a fever, pain , or abdominal swelling? No. Pain Score  0 *  Have you tolerated food without any problems? Yes.    Have you been able to return to your normal activities? Yes.    Do you have any questions about your discharge instructions: Diet   No. Medications  No. Follow up visit  No.  Do you have questions or concerns about your Care? No.  Actions: * If pain score is 4 or above: No action needed, pain <4.  Have you developed a fever since your procedure? no  2.   Have you had an respiratory symptoms (SOB or cough) since your procedure? no  3.   Have you tested positive for COVID 19 since your procedure no  4.   Have you had any family members/close contacts diagnosed with the COVID 19 since your procedure?  no   If yes to any of these questions please route to Joylene John, RN and Joella Prince, RN

## 2021-01-09 ENCOUNTER — Other Ambulatory Visit: Payer: Self-pay | Admitting: Adult Health

## 2021-01-09 DIAGNOSIS — F419 Anxiety disorder, unspecified: Secondary | ICD-10-CM

## 2021-01-09 DIAGNOSIS — G8929 Other chronic pain: Secondary | ICD-10-CM

## 2021-01-09 DIAGNOSIS — R519 Headache, unspecified: Secondary | ICD-10-CM

## 2021-01-09 DIAGNOSIS — R0989 Other specified symptoms and signs involving the circulatory and respiratory systems: Secondary | ICD-10-CM

## 2021-01-13 ENCOUNTER — Encounter: Payer: Self-pay | Admitting: Gastroenterology

## 2021-01-15 ENCOUNTER — Other Ambulatory Visit: Payer: Self-pay | Admitting: Internal Medicine

## 2021-01-15 DIAGNOSIS — Z1231 Encounter for screening mammogram for malignant neoplasm of breast: Secondary | ICD-10-CM

## 2021-01-22 ENCOUNTER — Other Ambulatory Visit: Payer: Self-pay | Admitting: Nurse Practitioner

## 2021-01-22 DIAGNOSIS — F418 Other specified anxiety disorders: Secondary | ICD-10-CM

## 2021-02-04 NOTE — Progress Notes (Signed)
FOLLOW UP  Assessment and Plan:   Labile hypertension - Begin Losartan and stop Metoprolol,  DASH diet, exercise and monitor at home. Call if greater than 130/80.  -     CBC with Differential/Platelet -     COMPLETE METABOLIC PANEL WITH GFR  Mixed hyperlipidemia check lipids decrease fatty foods increase activity.  -     Lipid panel  Fatty Liver Continue to work on diet and exercise CMP  Vitamin D deficiency Check Vit D level Continue Vit D supplementation to maintain level between 60-100  Anxiety Continue counseling Use Xanax sparingly  Sleep apnea with hypersomnolence continue mouth piece  Obesity (BMI 30.0-34.9) - follow up 3 months for progress monitoring - increase veggies, decrease carbs - long discussion about weight loss, diet, and exercise -Will try Saxenda to assist with weight loss, instructions and sample given.  Script sent to pharmacy and advised patient may require prior authorization  Migraine Has had no relief with use of Metoprolol and Topamax Do not wish to put on Triptan due to SSRI use, will try Nurtec with migraines as needed , if having more than 8 per month she is to call the office and will do preventative every other day   Continue diet and meds as discussed. Further disposition pending results of labs. Over 30 minutes of exam, counseling, chart review, and critical decision making was performed  Future Appointments  Date Time Provider [place]  02/18/2021 11:50 AM [facility] MM 3 [facility]  02/23/2021 11:10 AM Thornton Park, MD [facility]  05/06/2021 10:00 AM Liane Comber, NP [facility] None     HPI 45 y.o. female  presents for 3 month follow up on hypertension, cholesterol, prediabetes, and vitamin D deficiency.   Her migraines have not been controlled with use of Topamax and Metoprolol. She is having 2-3 migraines a week. She does have associated nausea, light and sound sensitivity.    Her blood  pressure has not been controlled at home, has checked it several times at home and at appointments with it elevated, today their BP is BP: (!) 142/82 .  She is trying to get mouth piece for her sleep apnea, now wearing it more. She has anxiety, she will take xanax 1/2 a few times a week. Lexapro 20 mg is helping with anxiety. BP Readings from Last 3 Encounters:  02/09/21 (!) 142/82  01/04/21 (!) 148/98  12/22/20 120/78   BMI is Body mass index is 31.64 kg/m., she is working on diet and exercise. She has consistently gained weight over the past 18 months- currently up 19 pounds.  Wt Readings from Last 10 Encounters:  02/09/21 178 lb 9.6 oz (81 kg)  01/04/21 174 lb (78.9 kg)  12/22/20 174 lb 2 oz (79 kg)  11/05/20 170 lb (77.1 kg)  08/28/20 165 lb (74.8 kg)  07/13/20 167 lb (75.8 kg)  05/06/20 164 lb (74.4 kg)  08/19/19 154 lb (69.9 kg)  08/01/19 158 lb 15.2 oz (72.1 kg)  07/29/19 159 lb (72.1 kg)    She does not workout since her surgery, just active with the kids at this time and before the surgery yoga. She denies chest pain, shortness of breath, dizziness.   She  is not  on cholesterol medication and denies myalgias. Her cholesterol is at goal. The cholesterol last visit was:   Lab Results  Component Value Date   CHOL 131 05/06/2020   HDL 51 05/06/2020   LDLCALC 63 05/06/2020   TRIG 85  05/06/2020   CHOLHDL 2.6 05/06/2020   Last A1C in the office was:  Lab Results  Component Value Date   HGBA1C 5.1 05/06/2020   Patient is on Vitamin D supplement.   Lab Results  Component Value Date   VD25OH 33 05/06/2020      Current Medications:  Current Outpatient Medications on File Prior to Visit  Medication Sig   ALPRAZolam (XANAX) 0.25 MG tablet TAKE 1/2 TO 1 TABLET ONCE OR TWICE DAILY AS NEEDED FOR ANXIETY ATTACKS (Patient taking differently: Take 0.125-0.25 mg by mouth 2 (two) times daily as needed for anxiety.)   Cholecalciferol (VITAMIN D) 125 MCG (5000 UT) CAPS Take 5,000  Units by mouth daily.   escitalopram (LEXAPRO) 20 MG tablet Take  1 tablet  Daily for Mood  (Dx: f41.8)   famotidine (PEPCID) 40 MG tablet Take 1 tablet (40 mg total) by mouth at bedtime.   metoprolol succinate (TOPROL-XL) 25 MG 24 hr tablet TAKE 1 TABLET DAILY FOR BP & MIGRAINE PREVENTION   pantoprazole (PROTONIX) 40 MG tablet Take 1 tablet (40 mg total) by mouth 2 (two) times daily.   topiramate (TOPAMAX) 100 MG tablet TAKE 1 TABLET BY MOUTH EVERYDAY AT BEDTIME   valACYclovir (VALTREX) 500 MG tablet Take 1 tablet (500 mg total) by mouth 2 (two) times daily. (Patient taking differently: Take 500 mg by mouth 2 (two) times daily as needed (outbreaks). As needed)   No current facility-administered medications on file prior to visit.    Medical History:  Past Medical History:  Diagnosis Date   Anxiety    Chronic cholecystitis    with stones   Diverticulosis of colon 02/14/2019   Fatty liver 02/14/2019   Per CT 02/2019   GERD (gastroesophageal reflux disease)    Headache    migraines   History of kidney stones    HSV-2 infection    Hypertension    Mild obstructive sleep apnea    study 07-12-2014 in epic,  mild osa w/ hypopnea AHI 13/hr--- 08-25-2017 per pt uses dental device   Pelvic pain in female    PONV (postoperative nausea and vomiting)    Wears glasses    Allergies:  Allergies  Allergen Reactions   Codeine Nausea And Vomiting   Azithromycin Nausea And Vomiting   Chlorhexidine Gluconate Rash    CHG     Review of Systems:  Review of Systems  Constitutional:  Positive for malaise/fatigue. Negative for chills, fever and weight loss.  HENT: Negative.  Negative for congestion, hearing loss and tinnitus.   Eyes: Negative.  Negative for blurred vision and double vision.  Respiratory: Negative.  Negative for cough and shortness of breath.   Cardiovascular: Negative.  Negative for chest pain, palpitations, orthopnea and leg swelling.  Gastrointestinal: Negative.  Negative for  abdominal pain, constipation, diarrhea, heartburn, nausea and vomiting.  Genitourinary: Negative.   Musculoskeletal: Negative.  Negative for falls, joint pain and myalgias.  Skin: Negative.  Negative for rash.  Neurological:  Positive for headaches. Negative for dizziness, tingling, tremors and loss of consciousness.  Psychiatric/Behavioral: Negative.  Negative for depression, memory loss and suicidal ideas.     Family history- Review and unchanged Social history- Review and unchanged Physical Exam: BP (!) 142/82   Pulse 68   Temp (!) 97.5 F (36.4 C)   Wt 178 lb 9.6 oz (81 kg)   SpO2 98%   BMI 31.64 kg/m  Wt Readings from Last 3 Encounters:  02/09/21 178 lb 9.6 oz (81  kg)  01/04/21 174 lb (78.9 kg)  12/22/20 174 lb 2 oz (79 kg)   General Appearance: Well nourished, obese female in no apparent distress. Eyes: PERRLA, EOMs, conjunctiva no swelling or erythema Sinuses: No Frontal/maxillary tenderness ENT/Mouth: Ext aud canals clear, TMs without erythema, bulging. No erythema, swelling, or exudate on post pharynx.  Tonsils not swollen or erythematous. Hearing normal.  Neck: Supple, thyroid normal.  Respiratory: Respiratory effort normal, BS equal bilaterally without rales, rhonchi, wheezing or stridor.  Cardio: RRR with no MRGs. Brisk peripheral pulses without edema.  Abdomen: Soft, + BS,  Non tender, no guarding, rebound, hernias, masses. Lymphatics: Non tender without lymphadenopathy.  Musculoskeletal: Full ROM, 5/5 strength, Normal gait Skin: Warm, dry without rashes, lesions, ecchymosis.  Neuro: Cranial nerves intact. Normal muscle tone, no cerebellar symptoms. Psych: Awake and oriented X 3, normal affect, Insight and Judgment appropriate.    Magda Bernheim, NP 11:50 AM [facility]

## 2021-02-05 ENCOUNTER — Ambulatory Visit: Payer: 59 | Admitting: Nurse Practitioner

## 2021-02-09 ENCOUNTER — Other Ambulatory Visit: Payer: Self-pay

## 2021-02-09 ENCOUNTER — Encounter: Payer: Self-pay | Admitting: Nurse Practitioner

## 2021-02-09 ENCOUNTER — Ambulatory Visit (INDEPENDENT_AMBULATORY_CARE_PROVIDER_SITE_OTHER): Payer: 59 | Admitting: Nurse Practitioner

## 2021-02-09 VITALS — BP 142/82 | HR 68 | Temp 97.5°F | Wt 178.6 lb

## 2021-02-09 DIAGNOSIS — K760 Fatty (change of) liver, not elsewhere classified: Secondary | ICD-10-CM

## 2021-02-09 DIAGNOSIS — R0989 Other specified symptoms and signs involving the circulatory and respiratory systems: Secondary | ICD-10-CM | POA: Diagnosis not present

## 2021-02-09 DIAGNOSIS — E669 Obesity, unspecified: Secondary | ICD-10-CM

## 2021-02-09 DIAGNOSIS — G4730 Sleep apnea, unspecified: Secondary | ICD-10-CM

## 2021-02-09 DIAGNOSIS — G4710 Hypersomnia, unspecified: Secondary | ICD-10-CM

## 2021-02-09 DIAGNOSIS — E559 Vitamin D deficiency, unspecified: Secondary | ICD-10-CM

## 2021-02-09 DIAGNOSIS — G43909 Migraine, unspecified, not intractable, without status migrainosus: Secondary | ICD-10-CM

## 2021-02-09 DIAGNOSIS — F418 Other specified anxiety disorders: Secondary | ICD-10-CM

## 2021-02-09 DIAGNOSIS — E782 Mixed hyperlipidemia: Secondary | ICD-10-CM

## 2021-02-09 MED ORDER — SAXENDA 18 MG/3ML ~~LOC~~ SOPN: 3.0000 mg | PEN_INJECTOR | Freq: Every day | SUBCUTANEOUS | 3 refills | Status: DC

## 2021-02-09 MED ORDER — LOSARTAN POTASSIUM 25 MG PO TABS: 25.0000 mg | ORAL_TABLET | Freq: Every day | ORAL | 11 refills | Status: DC

## 2021-02-09 MED ORDER — NURTEC 75 MG PO TBDP: ORAL_TABLET | ORAL | 2 refills | Status: AC

## 2021-02-09 NOTE — Patient Instructions (Signed)
Liraglutide Injection (Weight Management) What is this medication? LIRAGLUTIDE (LIR a GLOO tide) promotes weight loss. It may also be used to maintain weight loss. It works by decreasing appetite. Changes to diet and exercise are often combined with this medication. This medicine may be used for other purposes; ask your health care provider or pharmacist if you have questions. COMMON BRAND NAME(S): Saxenda What should I tell my care team before I take this medication? They need to know if you have any of these conditions: Endocrine tumors (MEN 2) or if someone in your family had these tumors Gallbladder disease High cholesterol History of alcohol abuse problem History of pancreatitis Kidney disease or if you are on dialysis Liver disease Previous swelling of the tongue, face, or lips with difficulty breathing, difficulty swallowing, hoarseness, or tightening of the throat Stomach problems Suicidal thoughts, plans, or attempt; a previous suicide attempt by you or a family member Thyroid cancer or if someone in your family had thyroid cancer An unusual or allergic reaction to liraglutide, other medications, foods, dyes, or preservatives Pregnant or trying to get pregnant Breast-feeding How should I use this medication? This medication is for injection under the skin of your upper leg, stomach area, or upper arm. You will be taught how to prepare and give this medication. Use exactly as directed. Take your medication at regular intervals. Do not take it more often than directed. This medication comes with INSTRUCTIONS FOR USE. Ask your pharmacist for directions on how to use this medication. Read the information carefully. Talk to your pharmacist or care team if you have questions. It is important that you put your used needles and syringes in a special sharps container. Do not put them in a trash can. If you do not have a sharps container, call your pharmacist or care team to get one. A  special MedGuide will be given to you by the pharmacist with each prescription and refill. Be sure to read this information carefully each time. Talk to your care team about the use of this medication in children. While it may be prescribed for children as young as 58 years of age for selected conditions, precautions do apply. Overdosage: If you think you have taken too much of this medicine contact a poison control center or emergency room at once. NOTE: This medicine is only for you. Do not share this medicine with others. What if I miss a dose? If you miss a dose, take it as soon as you can. If it is almost time for your next dose, take only that dose. Do not take double or extra doses. If you miss your dose for 3 days or more, call your care team to talk about how to restart this medicine. What may interact with this medication? Insulin and other medications for diabetes This list may not describe all possible interactions. Give your health care provider a list of all the medicines, herbs, non-prescription drugs, or dietary supplements you use. Also tell them if you smoke, drink alcohol, or use illegal drugs. Some items may interact with your medicine. What should I watch for while using this medication? Visit your care team for regular checks on your progress. Drink plenty of fluids while taking this medication. Check with your care team if you get an attack of severe diarrhea, nausea, and vomiting. The loss of too much body fluid can make it dangerous for you to take this medication. This medication may affect blood sugar levels. Ask your care team if  changes in diet or medications are needed if you have diabetes. Patients and their families should watch out for worsening depression or thoughts of suicide. Also watch out for sudden changes in feelings such as feeling anxious, agitated, panicky, irritable, hostile, aggressive, impulsive, severely restless, overly excited and hyperactive, or not  being able to sleep. If this happens, especially at the beginning of treatment or after a change in dose, call your care team. Women should inform their care team if they wish to become pregnant or think they might be pregnant. Losing weight while pregnant is not advised and may cause harm to the unborn child. Talk to your care team for more information. What side effects may I notice from receiving this medication? Side effects that you should report to your care team as soon as possible: Allergic reactions or angioedema-skin rash, itching, hives, swelling of the face, eyes, lips, tongue, arms, or legs, trouble swallowing or breathing Fast or irregular heartbeat Gallbladder problems-severe stomach pain, nausea, vomiting, fever Kidney injury-decrease in the amount of urine, swelling of the ankles, hands, or feet Pancreatitis-severe stomach pain that spreads to your back or gets worse after eating or when touched, fever, nausea, vomiting Thoughts of suicide or self-harm, worsening mood, feelings of depression Thyroid cancer-new mass or lump in the neck, pain or trouble swallowing, trouble breathing, hoarseness Side effects that usually do not require medical attention (report to your care team if they continue or are bothersome): Constipation Dizziness Fatigue Headache Loss of Appetite Nausea Upset stomach This list may not describe all possible side effects. Call your doctor for medical advice about side effects. You may report side effects to FDA at 1-800-FDA-1088. Where should I keep my medication? Keep out of the reach of children and pets. Store unopened pen in a refrigerator between 2 and 8 degrees C (36 and 46 degrees F). Do not freeze or use if the medication has been frozen. Protect from light and excessive heat. After you first use the pen, it can be stored at room temperature between 15 and 30 degrees C (59 and 86 degrees F) or in a refrigerator. Throw away your used pen after 30 days  or after the expiration date, whichever comes first. Do not store your pen with the needle attached. If the needle is left on, medication may leak from the pen. NOTE: This sheet is a summary. It may not cover all possible information. If you have questions about this medicine, talk to your doctor, pharmacist, or health care provider.  2022 Elsevier/Gold Standard (2020-04-17 13:54:21)

## 2021-02-10 LAB — CBC WITH DIFFERENTIAL/PLATELET
Absolute Monocytes: 571 cells/uL (ref 200–950)
Basophils Absolute: 42 cells/uL (ref 0–200)
Basophils Relative: 0.5 %
Eosinophils Absolute: 160 cells/uL (ref 15–500)
Eosinophils Relative: 1.9 %
HCT: 44.6 % (ref 35.0–45.0)
Hemoglobin: 15.2 g/dL (ref 11.7–15.5)
Lymphs Abs: 2646 cells/uL (ref 850–3900)
MCH: 33 pg (ref 27.0–33.0)
MCHC: 34.1 g/dL (ref 32.0–36.0)
MCV: 96.7 fL (ref 80.0–100.0)
MPV: 10.4 fL (ref 7.5–12.5)
Monocytes Relative: 6.8 %
Neutro Abs: 4981 cells/uL (ref 1500–7800)
Neutrophils Relative %: 59.3 %
Platelets: 237 10*3/uL (ref 140–400)
RBC: 4.61 10*6/uL (ref 3.80–5.10)
RDW: 11.9 % (ref 11.0–15.0)
Total Lymphocyte: 31.5 %
WBC: 8.4 10*3/uL (ref 3.8–10.8)

## 2021-02-10 LAB — VITAMIN D 25 HYDROXY (VIT D DEFICIENCY, FRACTURES): Vit D, 25-Hydroxy: 31 ng/mL (ref 30–100)

## 2021-02-10 LAB — COMPLETE METABOLIC PANEL WITH GFR
AG Ratio: 1.5 (calc) (ref 1.0–2.5)
ALT: 31 U/L — ABNORMAL HIGH (ref 6–29)
AST: 19 U/L (ref 10–30)
Albumin: 4.6 g/dL (ref 3.6–5.1)
Alkaline phosphatase (APISO): 51 U/L (ref 31–125)
BUN: 9 mg/dL (ref 7–25)
CO2: 23 mmol/L (ref 20–32)
Calcium: 9 mg/dL (ref 8.6–10.2)
Chloride: 104 mmol/L (ref 98–110)
Creat: 0.55 mg/dL (ref 0.50–0.99)
Globulin: 3 g/dL (calc) (ref 1.9–3.7)
Glucose, Bld: 84 mg/dL (ref 65–99)
Potassium: 3.9 mmol/L (ref 3.5–5.3)
Sodium: 137 mmol/L (ref 135–146)
Total Bilirubin: 0.5 mg/dL (ref 0.2–1.2)
Total Protein: 7.6 g/dL (ref 6.1–8.1)
eGFR: 117 mL/min/{1.73_m2} (ref >=60)

## 2021-02-10 LAB — LIPID PANEL
Cholesterol: 166 mg/dL (ref <200)
HDL: 63 mg/dL (ref >=50)
LDL Cholesterol (Calc): 77 mg/dL (calc)
Non-HDL Cholesterol (Calc): 103 mg/dL (calc) (ref <130)
Total CHOL/HDL Ratio: 2.6 (calc) (ref <5.0)
Triglycerides: 163 mg/dL — ABNORMAL HIGH (ref <150)

## 2021-02-11 ENCOUNTER — Telehealth: Payer: Self-pay

## 2021-02-11 NOTE — Telephone Encounter (Signed)
Nurtec 75 mg prior auth approved through 02/11/22.

## 2021-02-18 ENCOUNTER — Encounter: Payer: Self-pay | Admitting: Internal Medicine

## 2021-02-18 ENCOUNTER — Ambulatory Visit
Admission: RE | Admit: 2021-02-18 | Discharge: 2021-02-18 | Disposition: A | Discharge status: Home / Self Care | Payer: 59 | Source: Ambulatory Visit | Attending: Internal Medicine | Admitting: Internal Medicine

## 2021-02-18 ENCOUNTER — Other Ambulatory Visit: Payer: Self-pay

## 2021-02-18 DIAGNOSIS — Z1231 Encounter for screening mammogram for malignant neoplasm of breast: Secondary | ICD-10-CM

## 2021-02-23 ENCOUNTER — Ambulatory Visit: Payer: 59 | Admitting: Gastroenterology

## 2021-02-27 ENCOUNTER — Encounter: Payer: Self-pay | Admitting: Nurse Practitioner

## 2021-03-01 ENCOUNTER — Other Ambulatory Visit: Payer: Self-pay | Admitting: Nurse Practitioner

## 2021-03-01 DIAGNOSIS — E669 Obesity, unspecified: Secondary | ICD-10-CM

## 2021-03-01 MED ORDER — SAXENDA 18 MG/3ML ~~LOC~~ SOPN: 3.0000 mg | PEN_INJECTOR | Freq: Every day | SUBCUTANEOUS | 3 refills | Status: DC

## 2021-03-16 ENCOUNTER — Ambulatory Visit (INDEPENDENT_AMBULATORY_CARE_PROVIDER_SITE_OTHER): Payer: 59 | Admitting: Gastroenterology

## 2021-03-16 ENCOUNTER — Telehealth: Payer: Self-pay

## 2021-03-16 ENCOUNTER — Encounter: Payer: Self-pay | Admitting: Gastroenterology

## 2021-03-16 VITALS — BP 130/94 | HR 88 | Ht 63.0 in | Wt 180.8 lb

## 2021-03-16 DIAGNOSIS — R1011 Right upper quadrant pain: Secondary | ICD-10-CM

## 2021-03-16 DIAGNOSIS — K2990 Gastroduodenitis, unspecified, without bleeding: Secondary | ICD-10-CM

## 2021-03-16 DIAGNOSIS — K2970 Gastritis, unspecified, without bleeding: Secondary | ICD-10-CM

## 2021-03-16 DIAGNOSIS — R1310 Dysphagia, unspecified: Secondary | ICD-10-CM | POA: Diagnosis not present

## 2021-03-16 DIAGNOSIS — K209 Esophagitis, unspecified without bleeding: Secondary | ICD-10-CM

## 2021-03-16 MED ORDER — PANTOPRAZOLE SODIUM 40 MG PO TBEC: 40.0000 mg | DELAYED_RELEASE_TABLET | Freq: Two times a day (BID) | ORAL | 3 refills | Status: DC

## 2021-03-16 NOTE — Progress Notes (Signed)
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 female initially referred by Marta Lamas for further evaluation of esophageal dysphagia to solids with food sticking at the sternal notch, frequent sore throat, frequent throat clearing, intermittent dysphonia.  Occasional associated upper abdominal pain.  She felt like she was having esophageal spasms.  Symptoms were similar to those that ultimately led to cholecystectomy. Now with urgent defecation.   EGD 01/04/2021 showed LA grade a reflux esophagitis, multiple gastric erosions, and examined duodenum.  Biopsies confirmed reflux esophagitis and reactive gastropathy.  There was no eosinophilic esophagitis or H. pylori.  Pantoprazole 40 mg twice daily was recommended.  The patient has been using this on a daily basis.  Does not remember to take Pepcid.  She continues to have ongoing symptoms of reflux and dysphagia.  She talked to her doctor and adjusted her medications to avoid NSAIDs.    Recent abdominal imaging: CT of the abdomen pelvis 07/14/2020: No findings to explain her abdominal pain, prior colitis activity, uterine fibroids  Labs 11/05/20: normal CMP except for glucose 102, normal CBC, normal TSH  Past Medical History:  Diagnosis Date   Anxiety    Chronic cholecystitis    with stones   Diverticulosis of colon 02/14/2019   Fatty liver 02/14/2019   Per CT 02/2019   GERD (gastroesophageal reflux disease)     Headache    migraines   History of kidney stones    HSV-2 infection    Hypertension    Mild obstructive sleep apnea    study 07-12-2014 in epic,  mild osa w/ hypopnea AHI 13/hr--- 08-25-2017 per pt uses dental device   Pelvic pain in female    PONV (postoperative nausea and vomiting)    Wears glasses     Past Surgical History:  Procedure Laterality Date   BREAST CYST EXCISION Left    2016?   CHOLECYSTECTOMY N/A 08/01/2019   Procedure: LAPAROSCOPIC CHOLECYSTECTOMY;  Surgeon: Stark Klein, MD;  Location: [place];  Service: General;  Laterality: N/A;   DILATION AND CURETTAGE OF UTERUS  age 31   w/ suction for miscarriage   ENDOMETRIAL ABLATION N/A 09/01/2017   Procedure: ENDOMETRIAL ABLATION;  Surgeon: Cheri Fowler, MD;  Location: [place];  Service: Gynecology;  Laterality: N/A;  USING MINERVA   ESSURE TUBAL LIGATION Bilateral Jan or Feb 2012   dr meisinger office   LAPAROSCOPIC BILATERAL SALPINGECTOMY Bilateral 09/01/2017   Procedure: LAPAROSCOPIC BILATERAL SALPINGECTOMY;  Surgeon: Cheri Fowler, MD;  Location: [place];  Service: Gynecology;  Laterality: Bilateral;  DR request RNFA   MASS EXCISION Left 01/14/2016   Procedure: EXCISION OF LEFT BREAST MASS;  Surgeon: Stark Klein, MD;  Location: [place];  Service: General;  Laterality: Left;   WISDOM TOOTH EXTRACTION        Current Outpatient Medications  Medication Sig Dispense Refill   ALPRAZolam Duanne Moron)  0.25 MG tablet TAKE 1/2 TO 1 TABLET ONCE OR TWICE DAILY AS NEEDED FOR ANXIETY ATTACKS (Patient taking differently: Take 0.125-0.25 mg by mouth 2 (two) times daily as needed for anxiety.) 60 tablet 0   Cholecalciferol (VITAMIN D) 125 MCG (5000 UT) CAPS Take 5,000 Units by mouth daily.     escitalopram (LEXAPRO) 20 MG tablet Take  1 tablet  Daily for Mood  (Dx: f41.8) 90 tablet 1   famotidine (PEPCID) 40 MG tablet Take 1 tablet (40 mg total) by mouth at bedtime. 90 tablet 3   Liraglutide  -Weight Management (SAXENDA) 18 MG/3ML SOPN Inject 3 mg into the skin daily. Inject one pen SQ daily for weight loss 15 mL 3   losartan (COZAAR) 25 MG tablet Take 1 tablet (25 mg total) by mouth daily. 30 tablet 11   Rimegepant Sulfate (NURTEC) 75 MG TBDP 1 tab at onset of headache, can redose in 1 hour if headache is not resolved no more than 2 in 24 hours 8 tablet 2   valACYclovir (VALTREX) 500 MG tablet Take 1 tablet (500 mg total) by mouth 2 (two) times daily. (Patient taking differently: Take 500 mg by mouth 2 (two) times daily as needed (outbreaks). As needed) 60 tablet 1   pantoprazole (PROTONIX) 40 MG tablet Take 1 tablet (40 mg total) by mouth 2 (two) times daily. 180 tablet 3   No current facility-administered medications for this visit.    Allergies as of 03/16/2021 - Review Complete 03/16/2021  Allergen Reaction Noted   Codeine Nausea And Vomiting 08/25/2017   Azithromycin Nausea And Vomiting 01/25/2013   Chlorhexidine gluconate Rash 01/14/2016    Family History  Problem Relation Age of Onset   Hypertension Mother    Hypertension Father    Heart disease Father    Alcohol abuse Father    Hyperlipidemia Father    Stroke Paternal Uncle    Diabetes Paternal Uncle    Heart disease Maternal Grandmother    Breast cancer Paternal Aunt    Breast cancer Paternal Aunt    Cancer Paternal Grandmother        abdominal - unsure what    Autism Daughter    Anxiety disorder Daughter     Physical Exam: General:   Alert,  well-nourished, pleasant and cooperative in NAD Head:  Normocephalic and atraumatic. Eyes:  Sclera clear, no icterus.   Conjunctiva pink. Abdomen:  Soft, nontender, nondistended, normal bowel sounds, no rebound or guarding. No hepatosplenomegaly.   Rectal:  Deferred  Msk:  Symmetrical. No boney deformities LAD: No inguinal or umbilical LAD Extremities:  No clubbing or edema. Neurologic:  Alert and  oriented x4;  grossly nonfocal Skin:  Intact without significant  lesions or rashes. Psych:  Alert and cooperative. Normal mood and affect.    Ransome Helwig L. Tarri Glenn, MD, MPH 03/16/2021, 10:50 AM

## 2021-03-16 NOTE — Telephone Encounter (Signed)
Prior Auth for [place] approved through 07/14/21.

## 2021-03-16 NOTE — Patient Instructions (Addendum)
It was my pleasure to provide care to you today. Based on our discussion, I am providing you with my recommendations below:  RECOMMENDATION(S):   Continue Pepcid 40 mg at bedtime Increase pantoprazole to 40 mg 2 times daily for at least 8 weeks Avoid all NSAIDs I would expect weight loss to improve your reflux Screening colonoscopy at age 45  FOLLOW UP:  I would like for you to follow up with me in 8-10 weeks, earlier if needed. Please call the office at [phone] to schedule your appointment.  BMI:  If you are age 51 or younger, your body mass index should be between 19-25. Your Body mass index is 32.03 kg/m. If this is out of the aformentioned range listed, please consider follow up with your Primary Care Provider.   MY CHART:  The [facility] providers would like to encourage you to use [facility] to communicate with providers for non-urgent requests or questions.  Due to long hold times on the telephone, sending your provider a message by [facility] may be a faster and more efficient way to get a response.  Please allow 48 business hours for a response.  Please remember that this is for non-urgent requests.   Thank you for trusting me with your gastrointestinal care!    Thornton Park, MD, MPH

## 2021-04-15 ENCOUNTER — Encounter: Payer: Self-pay | Admitting: Nurse Practitioner

## 2021-04-16 ENCOUNTER — Other Ambulatory Visit: Payer: Self-pay | Admitting: Nurse Practitioner

## 2021-04-16 DIAGNOSIS — F418 Other specified anxiety disorders: Secondary | ICD-10-CM

## 2021-04-16 MED ORDER — BUPROPION HCL ER (XL) 150 MG PO TB24: 150.0000 mg | ORAL_TABLET | ORAL | 2 refills | Status: DC

## 2021-04-20 ENCOUNTER — Encounter: Payer: 59 | Admitting: Adult Health Nurse Practitioner

## 2021-04-20 NOTE — Progress Notes (Deleted)
Assessment and Plan:  There are no diagnoses linked to this encounter.    Further disposition pending results of labs. Discussed med's effects and SE's.   Over 30 minutes of exam, counseling, chart review, and critical decision making was performed.   Future Appointments  Date Time Provider [place]  04/21/2021  9:30 AM Magda Bernheim, NP [facility] None  05/06/2021 10:00 AM Liane Comber, NP [facility] None    ------------------------------------------------------------------------------------------------------------------   HPI There were no vitals taken for this visit. 46 y.o.female presents for  Past Medical History:  Diagnosis Date   Anxiety    Chronic cholecystitis    with stones   Diverticulosis of colon 02/14/2019   Fatty liver 02/14/2019   Per CT 02/2019   GERD (gastroesophageal reflux disease)    Headache    migraines   History of kidney stones    HSV-2 infection    Hypertension    Mild obstructive sleep apnea    study 07-12-2014 in epic,  mild osa w/ hypopnea AHI 13/hr--- 08-25-2017 per pt uses dental device   Pelvic pain in female    PONV (postoperative nausea and vomiting)    Wears glasses      Allergies  Allergen Reactions   Codeine Nausea And Vomiting   Azithromycin Nausea And Vomiting   Chlorhexidine Gluconate Rash    CHG    Current Outpatient Medications on File Prior to Visit  Medication Sig   ALPRAZolam (XANAX) 0.25 MG tablet TAKE 1/2 TO 1 TABLET ONCE OR TWICE DAILY AS NEEDED FOR ANXIETY ATTACKS (Patient taking differently: Take 0.125-0.25 mg by mouth 2 (two) times daily as needed for anxiety.)   buPROPion (WELLBUTRIN XL) 150 MG 24 hr tablet Take 1 tablet (150 mg total) by mouth every morning.   Cholecalciferol (VITAMIN D) 125 MCG (5000 UT) CAPS Take 5,000 Units by mouth daily.   escitalopram (LEXAPRO) 20 MG tablet Take  1 tablet  Daily for Mood  (Dx: f41.8)   famotidine (PEPCID) 40 MG tablet Take 1 tablet (40 mg total) by mouth at  bedtime.   Liraglutide -Weight Management (SAXENDA) 18 MG/3ML SOPN Inject 3 mg into the skin daily. Inject one pen SQ daily for weight loss   losartan (COZAAR) 25 MG tablet Take 1 tablet (25 mg total) by mouth daily.   pantoprazole (PROTONIX) 40 MG tablet Take 1 tablet (40 mg total) by mouth 2 (two) times daily.   Rimegepant Sulfate (NURTEC) 75 MG TBDP 1 tab at onset of headache, can redose in 1 hour if headache is not resolved no more than 2 in 24 hours   valACYclovir (VALTREX) 500 MG tablet Take 1 tablet (500 mg total) by mouth 2 (two) times daily. (Patient taking differently: Take 500 mg by mouth 2 (two) times daily as needed (outbreaks). As needed)   No current facility-administered medications on file prior to visit.    ROS: all negative except above.   Physical Exam:  There were no vitals taken for this visit.  General Appearance: Well nourished, in no apparent distress. Eyes: PERRLA, EOMs, conjunctiva no swelling or erythema Sinuses: No Frontal/maxillary tenderness ENT/Mouth: Ext aud canals clear, TMs without erythema, bulging. No erythema, swelling, or exudate on post pharynx.  Tonsils not swollen or erythematous. Hearing normal.  Neck: Supple, thyroid normal.  Respiratory: Respiratory effort normal, BS equal bilaterally without rales, rhonchi, wheezing or stridor.  Cardio: RRR with no MRGs. Brisk peripheral pulses without edema.  Abdomen: Soft, + BS.  Non tender, no guarding, rebound,  hernias, masses. Lymphatics: Non tender without lymphadenopathy.  Musculoskeletal: Full ROM, 5/5 strength, normal gait.  Skin: Warm, dry without rashes, lesions, ecchymosis.  Neuro: Cranial nerves intact. Normal muscle tone, no cerebellar symptoms. Sensation intact.  Psych: Awake and oriented X 3, normal affect, Insight and Judgment appropriate.     Magda Bernheim, NP 4:46 PM [facility]

## 2021-04-21 ENCOUNTER — Ambulatory Visit: Payer: 59 | Admitting: Nurse Practitioner

## 2021-04-27 NOTE — Progress Notes (Signed)
Assessment and Plan:  Emily Nolan was seen today for acute visit.  Diagnoses and all orders for this visit:  Labile hypertension D/C Losartan 25mg  and begin Hyzaar 50/12.5mg , monitor BP daily at home, if consistently greater than 130/80 notify the office Keep appointment in 1 week for physical with A. Corbett NP Go to the ER if any chest pain, shortness of breath, nausea, dizziness, severe HA, changes vision/speech -     losartan-hydrochlorothiazide (HYZAAR) 50-12.5 MG tablet; Take 1 tablet by mouth daily.  Obesity (BMI 30.0-34.9) Continue diet and exercise- she is aware of the importance of walking at least 20 minutes 4 days a week as well as increasing protein and limiting carbohydrates and saturated fats Saxenda 3.0mg  did not change appetite, has not lost any weight. Will try Wegovy as semaglutide has a higher weight loss than liraglutide -     Semaglutide-Weight Management (WEGOVY) 1 MG/0.5ML SOAJ; Inject 1 mg into the skin once a week.       Further disposition pending results of labs. Discussed med's effects and SE's.   Over 30 minutes of exam, counseling, chart review, and critical decision making was performed.   Future Appointments  Date Time Provider [place]  05/06/2021 10:00 AM Liane Comber, NP [facility] None    ------------------------------------------------------------------------------------------------------------------   HPI BP (!) 150/94    Pulse 86    Temp 97.7 F (36.5 C)    Wt 184 lb 12.8 oz (83.8 kg)    SpO2 97%    BMI 32.74 kg/m  46 y.o.female presents for elevated blood pressures.  BP meds were switched in November from metoprolol to losartan and BP has remained elevated. She has noticed heart beating in her ears.  Has not been checking her Bp at home  BP Readings from Last 3 Encounters:  04/29/21 (!) 150/94  03/16/21 (!) 130/94  02/09/21 (!) 142/82    BMI is Body mass index is 32.74 kg/m., she has been working on diet and exercise. She took  [place] and did not feel suppression of her appetite even at 3 mg dose. She had Covid during Christmas and she did stop saxenda for short time but once she restarted the med she did not notice any control of her appetite. She is walking more and has join the [employer]. Trying to cut back on carbs and saturated fats.  Wt Readings from Last 3 Encounters:  04/29/21 184 lb 12.8 oz (83.8 kg)  03/16/21 180 lb 12.8 oz (82 kg)  02/09/21 178 lb 9.6 oz (81 kg)     Past Medical History:  Diagnosis Date   Anxiety    Chronic cholecystitis    with stones   Diverticulosis of colon 02/14/2019   Fatty liver 02/14/2019   Per CT 02/2019   GERD (gastroesophageal reflux disease)    Headache    migraines   History of kidney stones    HSV-2 infection    Hypertension    Mild obstructive sleep apnea    study 07-12-2014 in epic,  mild osa w/ hypopnea AHI 13/hr--- 08-25-2017 per pt uses dental device   Pelvic pain in female    PONV (postoperative nausea and vomiting)    Wears glasses      Allergies  Allergen Reactions   Codeine Nausea And Vomiting   Azithromycin Nausea And Vomiting   Chlorhexidine Gluconate Rash    CHG    Current Outpatient Medications on File Prior to Visit  Medication Sig   ALPRAZolam (XANAX) 0.25 MG tablet TAKE 1/2  TO 1 TABLET ONCE OR TWICE DAILY AS NEEDED FOR ANXIETY ATTACKS (Patient taking differently: Take 0.125-0.25 mg by mouth 2 (two) times daily as needed for anxiety.)   buPROPion (WELLBUTRIN XL) 150 MG 24 hr tablet Take 1 tablet (150 mg total) by mouth every morning.   Cholecalciferol (VITAMIN D) 125 MCG (5000 UT) CAPS Take 5,000 Units by mouth daily.   escitalopram (LEXAPRO) 20 MG tablet Take  1 tablet  Daily for Mood  (Dx: f41.8)   famotidine (PEPCID) 40 MG tablet Take 1 tablet (40 mg total) by mouth at bedtime.   losartan (COZAAR) 25 MG tablet Take 1 tablet (25 mg total) by mouth daily.   pantoprazole (PROTONIX) 40 MG tablet Take 1 tablet (40 mg total) by mouth 2 (two) times  daily.   Rimegepant Sulfate (NURTEC) 75 MG TBDP 1 tab at onset of headache, can redose in 1 hour if headache is not resolved no more than 2 in 24 hours   valACYclovir (VALTREX) 500 MG tablet Take 1 tablet (500 mg total) by mouth 2 (two) times daily. (Patient taking differently: Take 500 mg by mouth 2 (two) times daily as needed (outbreaks). As needed)   Liraglutide -Weight Management (SAXENDA) 18 MG/3ML SOPN Inject 3 mg into the skin daily. Inject one pen SQ daily for weight loss (Patient not taking: Reported on 04/29/2021)   No current facility-administered medications on file prior to visit.    ROS: all negative except above.   Physical Exam:  BP (!) 150/94    Pulse 86    Temp 97.7 F (36.5 C)    Wt 184 lb 12.8 oz (83.8 kg)    SpO2 97%    BMI 32.74 kg/m   General Appearance: Well nourished, in no apparent distress. Eyes: PERRLA, EOMs, conjunctiva no swelling or erythema Sinuses: No Frontal/maxillary tenderness ENT/Mouth: Ext aud canals clear, TMs without erythema, bulging. No erythema, swelling, or exudate on post pharynx.  Tonsils not swollen or erythematous. Hearing normal.  Neck: Supple, thyroid normal.  Respiratory: Respiratory effort normal, BS equal bilaterally without rales, rhonchi, wheezing or stridor.  Cardio: RRR with no MRGs. Brisk peripheral pulses without edema.  Abdomen: Soft, + BS.  Non tender, no guarding, rebound, hernias, masses. Lymphatics: Non tender without lymphadenopathy.  Musculoskeletal: Full ROM, 5/5 strength, normal gait.  Skin: Warm, dry without rashes, lesions, ecchymosis.  Neuro: Cranial nerves intact. Normal muscle tone, no cerebellar symptoms. Sensation intact.  Psych: Awake and oriented X 3, normal affect, Insight and Judgment appropriate.     Magda Bernheim, NP 4:07 PM [facility]

## 2021-04-29 ENCOUNTER — Encounter: Payer: Self-pay | Admitting: Nurse Practitioner

## 2021-04-29 ENCOUNTER — Ambulatory Visit (INDEPENDENT_AMBULATORY_CARE_PROVIDER_SITE_OTHER): Payer: 59 | Admitting: Nurse Practitioner

## 2021-04-29 ENCOUNTER — Other Ambulatory Visit: Payer: Self-pay

## 2021-04-29 VITALS — BP 150/94 | HR 86 | Temp 97.7°F | Wt 184.8 lb

## 2021-04-29 DIAGNOSIS — E669 Obesity, unspecified: Secondary | ICD-10-CM

## 2021-04-29 DIAGNOSIS — R0989 Other specified symptoms and signs involving the circulatory and respiratory systems: Secondary | ICD-10-CM | POA: Diagnosis not present

## 2021-04-29 DIAGNOSIS — E66811 Obesity, class 1: Secondary | ICD-10-CM

## 2021-04-29 MED ORDER — WEGOVY 1 MG/0.5ML ~~LOC~~ SOAJ: 1.0000 mg | SUBCUTANEOUS | 2 refills | Status: DC

## 2021-04-29 MED ORDER — LOSARTAN POTASSIUM-HCTZ 50-12.5 MG PO TABS: 1.0000 | ORAL_TABLET | Freq: Every day | ORAL | 11 refills | Status: DC

## 2021-04-29 NOTE — Patient Instructions (Signed)
Semaglutide Injection (Weight Management) What is this medication? SEMAGLUTIDE (SEM a GLOO tide) promotes weight loss. It may also be used to maintain weight loss. It works by decreasing appetite. Changes to diet and exercise are often combined with this medication. This medicine may be used for other purposes; ask your health care provider or pharmacist if you have questions. COMMON BRAND NAME(S): [id number] What should I tell my care team before I take this medication? They need to know if you have any of these conditions: Endocrine tumors (MEN 2) or if someone in your family had these tumors Eye disease, vision problems Gallbladder disease History of depression or mental health disease History of pancreatitis Kidney disease Stomach or intestine problems Suicidal thoughts, plans, or attempt; a previous suicide attempt by you or a family member Thyroid cancer or if someone in your family had thyroid cancer An unusual or allergic reaction to semaglutide, other medications, foods, dyes, or preservatives Pregnant or trying to get pregnant Breast-feeding How should I use this medication? This medication is injected under the skin. You will be taught how to prepare and give it. Take it as directed on the prescription label. It is given once every week (every 7 days). Keep taking it unless your care team tells you to stop. It is important that you put your used needles and pens in a special sharps container. Do not put them in a trash can. If you do not have a sharps container, call your pharmacist or care team to get one. A special MedGuide will be given to you by the pharmacist with each prescription and refill. Be sure to read this information carefully each time. This medication comes with INSTRUCTIONS FOR USE. Ask your pharmacist for directions on how to use this medication. Read the information carefully. Talk to your pharmacist or care team if you have questions. Talk to your care team about  the use of this medication in children. Special care may be needed. Overdosage: If you think you have taken too much of this medicine contact a poison control center or emergency room at once. NOTE: This medicine is only for you. Do not share this medicine with others. What if I miss a dose? If you miss a dose and the next scheduled dose is more than 2 days away, take the missed dose as soon as possible. If you miss a dose and the next scheduled dose is less than 2 days away, do not take the missed dose. Take the next dose at your regular time. Do not take double or extra doses. If you miss your dose for 2 weeks or more, take the next dose at your regular time or call your care team to talk about how to restart this medication. What may interact with this medication? Insulin and other medications for diabetes This list may not describe all possible interactions. Give your health care provider a list of all the medicines, herbs, non-prescription drugs, or dietary supplements you use. Also tell them if you smoke, drink alcohol, or use illegal drugs. Some items may interact with your medicine. What should I watch for while using this medication? Visit your care team for regular checks on your progress. It may be some time before you see the benefit from this medication. Drink plenty of fluids while taking this medication. Check with your care team if you have severe diarrhea, nausea, and vomiting, or if you sweat a lot. The loss of too much body fluid may make it dangerous for  you to take this medication. This medication may affect blood sugar levels. Ask your care team if changes in diet or medications are needed if you have diabetes. If you or your family notice any changes in your behavior, such as new or worsening depression, thoughts of harming yourself, anxiety, other unusual or disturbing thoughts, or memory loss, call your care team right away. Women should inform their care team if they wish to  become pregnant or think they might be pregnant. Losing weight while pregnant is not advised and may cause harm to the unborn child. Talk to your care team for more information. What side effects may I notice from receiving this medication? Side effects that you should report to your care team as soon as possible: Allergic reactions--skin rash, itching, hives, swelling of the face, lips, tongue, or throat Change in vision Dehydration--increased thirst, dry mouth, feeling faint or lightheaded, headache, dark yellow or brown urine Gallbladder problems--severe stomach pain, nausea, vomiting, fever Heart palpitations--rapid, pounding, or irregular heartbeat Kidney injury--decrease in the amount of urine, swelling of the ankles, hands, or feet Pancreatitis--severe stomach pain that spreads to your back or gets worse after eating or when touched, fever, nausea, vomiting Thoughts of suicide or self-harm, worsening mood, feelings of depression Thyroid cancer--new mass or lump in the neck, pain or trouble swallowing, trouble breathing, hoarseness Side effects that usually do not require medical attention (report to your care team if they continue or are bothersome): Diarrhea Loss of appetite Nausea Stomach pain Vomiting This list may not describe all possible side effects. Call your doctor for medical advice about side effects. You may report side effects to FDA at 1-800-FDA-1088. Where should I keep my medication? Keep out of the reach of children and pets. Refrigeration (preferred): Store in the refrigerator. Do not freeze. Keep this medication in the original container until you are ready to take it. Get rid of any unused medication after the expiration date. Room temperature: If needed, prior to cap removal, the pen can be stored at room temperature for up to 28 days. Protect from light. If it is stored at room temperature, get rid of any unused medication after 28 days or after it expires,  whichever is first. It is important to get rid of the medication as soon as you no longer need it or it is expired. You can do this in two ways: Take the medication to a medication take-back program. Check with your pharmacy or law enforcement to find a location. If you cannot return the medication, follow the directions in the [place]. NOTE: This sheet is a summary. It may not cover all possible information. If you have questions about this medicine, talk to your doctor, pharmacist, or health care provider.  2022 Elsevier/Gold Standard (2020-07-03 00:00:00)  Losartan; Hydrochlorothiazide Tablets What is this medication? LOSARTAN; HYDROCHLOROTHIAZIDE (loe SAR tan; hye droe klor oh THYE a zide) treats high blood pressure. It may also be used to prevent a stroke in people with heart disease and high blood pressure. It relaxes your blood vessels and helps your kidneys remove more fluid through the urine, which lowers blood pressure. This medication is a combination of an ARB and diuretic. This medicine may be used for other purposes; ask your health care provider or pharmacist if you have questions. COMMON BRAND NAME(S): Hyzaar What should I tell my care team before I take this medication? They need to know if you have any of these conditions: Decreased urine Diabetes If you are on  a special diet, like a low-salt diet Immune system problems, like lupus Kidney disease Liver disease An unusual or allergic reaction to losartan, hydrochlorothiazide, sulfa drugs, other medications, foods, dyes, or preservatives Pregnant or trying to get pregnant Breast-feeding How should I use this medication? Take this medication by mouth. Take it as directed on the prescription label at the same time every day. You can take it with or without food. If it upsets your stomach, take it with food. Keep taking it unless your care team tells you to stop. Talk to your care team about the use of this drug in children.  Special care may be needed. Overdosage: If you think you have taken too much of this medicine contact a poison control center or emergency room at once. NOTE: This medicine is only for you. Do not share this medicine with others. What if I miss a dose? If you miss a dose, take it as soon as you can. If it is almost time for your next dose, take only that dose. Do not take double or extra doses. What may interact with this medication? Do not take this medication with any of the following: Cidofovir Dofetilide Tranylcypromine This medication may also interact with the following: Barbiturates, like phenobarbital Blood pressure medications Celecoxib Diuretics, especially triamterene, spironolactone or amiloride Fluconazole Lithium Medications for diabetes Medications that relax the muscles for surgery Narcotic medications for pain NSAIDs, medications for pain and inflammation, like ibuprofen or naproxen Potassium salts or potassium supplements Rifampin Some cholesterol-lowering medications like cholestyramine or colestipol Steroid medications like prednisone or cortisone This list may not describe all possible interactions. Give your health care provider a list of all the medicines, herbs, non-prescription drugs, or dietary supplements you use. Also tell them if you smoke, drink alcohol, or use illegal drugs. Some items may interact with your medicine. What should I watch for while using this medication? Check your blood pressure regularly while you are taking this medication. Ask your care team what your blood pressure should be and when you should contact them. When you check your blood pressure, write down the measurements to show your care team. If you are taking this medication for a long time, you must visit your care team for regular checks on your progress. Make sure you schedule appointments on a regular basis. You must not get dehydrated. Ask your care team how much fluid you need  to drink a day. Check with them if you get an attack of severe diarrhea, nausea and vomiting, or if you sweat a lot. The loss of too much body fluid can make it dangerous for you to take this medication. Women should inform their doctor if they wish to become pregnant or think they might be pregnant. There is a potential for serious side effects to an unborn child, particularly in the second or third trimester. Talk to your care team or pharmacist for more information. You may get drowsy or dizzy. Do not drive, use machinery, or do anything that needs mental alertness until you know how this medication affects you. Do not stand or sit up quickly, especially if you are an older patient. This reduces the risk of dizzy or fainting spells. Alcohol can make you more drowsy and dizzy. Avoid alcoholic drinks. This medication may increase blood sugar. Ask your care team if changes in diet or medications are needed if you have diabetes. Talk to your care team about your risk of skin cancer. You may be more at risk for  skin cancer if you take this medication. This medication can make you more sensitive to the sun. Keep out of the sun. If you cannot avoid being in the sun, wear protective clothing and use sunscreen. Do not use sun lamps or tanning beds/booths. Avoid salt substitutes unless you are told otherwise by your care team. Do not treat yourself for coughs, colds, or pain while you are taking this medication without asking your care team for advice. Some ingredients may increase your blood pressure. What side effects may I notice from receiving this medication? Side effects that you should report to your care team as soon as possible: Allergic reactions--skin rash, itching, hives, swelling of the face, lips, tongue, or throat Dehydration--increased thirst, dry mouth, feeling faint or lightheaded, headache, dark yellow or brown urine Gout--severe pain, redness, warmth, or swelling in the joints, such as the  big toe Kidney injury--decrease in the amount of urine, swelling of the ankles, hands, or feet Low blood pressure--dizziness, feeling faint or lightheaded, blurry vision Low potassium level--muscle pain or cramps, unusual weakness, fatigue, fast or irregular heartbeat, constipation Sudden eye pain or change in vision such as blurred vision, seeing halos around lights, vision loss Side effects that usually do not require medical attention (report to your care team if they continue or are bothersome): Change in sex drive or performance Dizziness Headache Runny or stuffy nose Upset stomach This list may not describe all possible side effects. Call your doctor for medical advice about side effects. You may report side effects to FDA at 1-800-FDA-1088. Where should I keep my medication? Keep out of the reach of children and pets. Store at room temperature between 15 and 30 degrees C (59 and 86 degrees F). Protect from light. Keep the container tightly closed. Throw away any unused medication after the expiration date. NOTE: This sheet is a summary. It may not cover all possible information. If you have questions about this medicine, talk to your doctor, pharmacist, or health care provider.  2022 Elsevier/Gold Standard (2020-12-15 00:00:00)

## 2021-04-30 ENCOUNTER — Telehealth: Payer: Self-pay

## 2021-04-30 NOTE — Telephone Encounter (Signed)
Prior Auth for [employer] approved through 11/21/21

## 2021-05-06 ENCOUNTER — Encounter: Payer: 59 | Admitting: Adult Health

## 2021-05-11 NOTE — Progress Notes (Signed)
Complete Physical  Assessment and Plan:  Encounter for general adult medical examination with abnormal findings 1 year  Fatty liver Check labs, avoid tylenol, alcohol, weight loss advised.   Mixed hyperlipidemia -     Lipid Profile check lipids decrease fatty foods increase activity.   Labile hypertension -     Hyzaar 50/12.5mg , well controlled - continue medications, DASH diet, exercise and monitor at home. Call if greater than 130/80.  -     CBC with Diff -     COMPLETE METABOLIC PANEL WITH GFR -     TSH -     Urinalysis, Routine w reflex microscopic -     Microalbumin / Creatinine Urine Ratio  Sleep apnea with hypersomnolence Continue to follow up Dr. Toy Cookey, continue oral device If persistent apnea can refer for another sleep study  Depression with anxiety Start new medication as prescribed; celexa 20 mg tabs per patient preference as has tolerated in the past; 1/2 tab daily x 2 weeks then whole tab daily  Stress management techniques discussed, increase water, good sleep hygiene discussed, increase exercise, and increase veggies.  Follow up 2 month, call the office if any new AE's from medications and we will switch them  Obesity (BMI 30.0-34.9) Begin Topamax 50 mg and Phentermine 37.5 mg daily Recommended diet heavy in fruits and veggies and low in animal meats, cheeses, and dairy products, appropriate calorie intake Follow up 3 month  Medication management -     Magnesium  Vitamin D deficiency -     Vitamin D (25 hydroxy)  Migraine/ daily headache Nurtec as needed Topamax 50 mg daily Stress management reviewed; lifestyle reviewed Has had vision check   Screening for ischemic heart disease -EKG  Screening for diabetes mellitus -     Hemoglobin A1c ([facility])  Screening for thyroid disorder - TSH  Screening for hematuria or proteinuria - Routine UA with reflex microscopic - Microablumin/creatinne urine ratio    Discussed med's effects and SE's.  Screening labs and tests as requested with regular follow-up as recommended. Over 40 minutes of exam, counseling, chart review, and complex, high level critical decision making was performed this visit.   Future Appointments  Date Time Provider [place]  07/29/2021  9:00 AM Magda Bernheim, NP [facility] None    HPI  46 y.o. female  presents for a complete physical and follow up for has Sleep apnea with hypersomnolence; Depression with anxiety; Hyperlipidemia; Labile hypertension; Obesity (BMI 30.0-34.9); Fatty liver; Uterine fibroid; Diverticulosis of colon; Migraines; and Esophageal dysphagia on their problem list.   She is still going through a separation, stress going through holidays, 2 daughters and a younger son. She is currently well controlled on Wellbutrin and Lexapro. She has hx of depression/anxiety been seeing a therapist since 2020, she is taking the xanax AS needed 1/2-1 tablet 2-3 x a week.  She has had a history of hematuria, had cysto 10+ years ago, her last CT scan in 02/14/2019 showed bladder wall thickening suggestive of cystitis, followed up with urology with no changes.   She had gallstones and fatty liver on the CT, [place] abd 04/2019 showed Cholelithiasis, Nonspecific 9 mm hypoechoic hepatic nodule grossly stable in size since 02/14/2019 and had cholecystectomy 08/01/2019, reports has had intermittent unpredictable episodes of diarrhea, trying to track and adjust diet but interested in med to help this that was mentioned by surgeon. She has follow up appointment with Dr. Tarri Glenn  She has hx of frequent headaches; reports unilateral with  vision changes and may have nausea; typical migraine. She has been on topiramate 50 mg with significant improvement, but recently having near daily HA in afternoon, taking ibuprofen. In the past, still has some and just recently restarted and needs prescription refilled  Has sleep apnea, has mouth piece with Dr. Toy Cookey, sleep study  2016.    BMI is Body mass index is 31.78 kg/m., she is working on diet and exercise. She was going to start [facility] but is 1100, she has never tried Phentermine.  [facility] and is doing more exercises.  Wt Readings from Last 3 Encounters:  05/14/21 179 lb 6.4 oz (81.4 kg)  04/29/21 184 lb 12.8 oz (83.8 kg)  03/16/21 180 lb 12.8 oz (82 kg)   Has history of palpitations, normal holter 2014.  First MI in father at 63, died at 51 from MI, was alcoholic.  Her blood pressure has been controlled at home, BP is doing well with an increase in the metoprolol XL to 25 mg a day, today their BP is BP: 118/78 BP Readings from Last 3 Encounters:  05/14/21 118/78  04/29/21 (!) 150/94  03/16/21 (!) 130/94    She does not workout, she does some walking. She denies chest pain, shortness of breath, dizziness.    She is not on cholesterol medication and denies myalgias. Her cholesterol is at goal. The cholesterol last visit was:   Lab Results  Component Value Date   CHOL 166 02/09/2021   HDL 63 02/09/2021   LDLCALC 77 02/09/2021   TRIG 163 (H) 02/09/2021   CHOLHDL 2.6 02/09/2021    Last A1C in the office was:  Lab Results  Component Value Date   HGBA1C 5.1 05/06/2020   Patient is on Vitamin D supplement, taking 5000 IU daily    Lab Results  Component Value Date   VD25OH 31 02/09/2021     Lab Results  Component Value Date   JOINOMVE72 094 05/06/2020   She is s/p endometrial ablation and BSO and she has felt better since that time.  Lab Results  Component Value Date   IRON 109 03/19/2018   TIBC 353 03/19/2018   Lab Results  Component Value Date   ALT 31 (H) 02/09/2021      Current Medications:    Current Outpatient Medications (Cardiovascular):    losartan-hydrochlorothiazide (HYZAAR) 50-12.5 MG tablet, Take 1 tablet by mouth daily.   Current Outpatient Medications (Analgesics):    Rimegepant Sulfate (NURTEC) 75 MG TBDP, 1 tab at onset of headache, can redose in 1  hour if headache is not resolved no more than 2 in 24 hours   Current Outpatient Medications (Other):    ALPRAZolam (XANAX) 0.25 MG tablet, TAKE 1/2 TO 1 TABLET ONCE OR TWICE DAILY AS NEEDED FOR ANXIETY ATTACKS (Patient taking differently: Take 0.125-0.25 mg by mouth 2 (two) times daily as needed for anxiety.)   buPROPion (WELLBUTRIN XL) 150 MG 24 hr tablet, TAKE 1 TABLET BY MOUTH EVERY DAY IN THE MORNING   Cholecalciferol (VITAMIN D) 125 MCG (5000 UT) CAPS, Take 5,000 Units by mouth daily.   escitalopram (LEXAPRO) 20 MG tablet, Take  1 tablet  Daily for Mood  (Dx: f41.8)   famotidine (PEPCID) 40 MG tablet, Take 1 tablet (40 mg total) by mouth at bedtime.   pantoprazole (PROTONIX) 40 MG tablet, Take 1 tablet (40 mg total) by mouth 2 (two) times daily.   Semaglutide-Weight Management (WEGOVY) 1 MG/0.5ML SOAJ, Inject 1 mg into the  skin once a week.   valACYclovir (VALTREX) 500 MG tablet, Take 1 tablet (500 mg total) by mouth 2 (two) times daily. (Patient taking differently: Take 500 mg by mouth 2 (two) times daily as needed (outbreaks). As needed)  Allergies:  Allergies  Allergen Reactions   Codeine Nausea And Vomiting   Azithromycin Nausea And Vomiting   Chlorhexidine Gluconate Rash    CHG   Medical History:  She has Sleep apnea with hypersomnolence; Depression with anxiety; Hyperlipidemia; Labile hypertension; Obesity (BMI 30.0-34.9); Fatty liver; Uterine fibroid; Diverticulosis of colon; Migraines; and Esophageal dysphagia on their problem list.   Health Maintenance:   Immunization History  Administered Date(s) Administered   Influenza-Unspecified 01/12/2015   PPD Test 11/11/2013   Td 05/06/2020   Tdap 04/11/2010   Tetanus: 2022 Pneumovax: n/a Flu vaccine: 2022 at work  Shingrix: discuss age 89 Covid 53: not yet, considering, no questions   No LMP recorded. Pap: 2020, q3 years Dr. Willis Modena MGM: 02/18/21   Colonoscopy: due age 66  Sleep study 2016  Last vision: 2022,  [place] [facility], glasses occasionally  Dental: [place], last 2022, goes q67m  Patient Care Team: Unk Pinto, MD as PCP - General (Internal Medicine) Rozetta Nunnery, MD (Inactive) as Consulting Physician (Otolaryngology) Cheri Fowler, MD as Consulting Physician (Obstetrics and Gynecology) [place], [place] Eduardo Osier., MD as Attending Physician (Urology) Earlie Server, MD as Consulting Physician (Orthopedic Surgery)  Surgical History:  She has a past surgical history that includes Mass excision (Left, 01/14/2016); Dilation and curettage of uterus (age 67); Essure tubal ligation (Bilateral, Jan or Feb 2012   dr meisinger office); Laparoscopic bilateral salpingectomy (Bilateral, 09/01/2017); Endometrial ablation (N/A, 09/01/2017); Wisdom tooth extraction; Cholecystectomy (N/A, 08/01/2019); and Breast cyst excision (Left). Family History:  Herfamily history includes Alcohol abuse in her father; Anxiety disorder in her daughter; Autism in her daughter; Breast cancer in her paternal aunt and paternal aunt; Cancer in her paternal grandmother; Diabetes in her paternal uncle; Heart disease in her father and maternal grandmother; Hyperlipidemia in her father; Hypertension in her father and mother; Stroke in her paternal uncle. Social History:  She reports that she has never smoked. She has never used smokeless tobacco. She reports that she does not currently use alcohol. She reports that she does not use drugs.   Review of Systems: Review of Systems  Constitutional:  Negative for malaise/fatigue and weight loss.  HENT:  Negative for hearing loss and tinnitus.   Eyes:  Negative for blurred vision and double vision.  Respiratory:  Negative for cough, shortness of breath and wheezing.   Cardiovascular:  Negative for chest pain, palpitations, orthopnea, claudication and leg swelling.  Gastrointestinal:  Positive for diarrhea. Negative for abdominal pain, blood in stool, constipation, heartburn,  melena, nausea and vomiting.  Genitourinary: Negative.   Musculoskeletal:  Negative for joint pain and myalgias.  Skin:  Negative for rash.  Neurological:  Positive for headaches (nearly daily, unilateral with vision changes and intermittent nausea). Negative for dizziness, tingling, sensory change and weakness.  Endo/Heme/Allergies:  Negative for polydipsia.  Psychiatric/Behavioral:  Positive for depression. Negative for hallucinations, memory loss, substance abuse and suicidal ideas. The patient is nervous/anxious. The patient does not have insomnia.   All other systems reviewed and are negative.  Physical Exam: Estimated body mass index is 31.78 kg/m as calculated from the following:   Height as of this encounter: 5\' 3"  (1.6 m).   Weight as of this encounter: 179 lb 6.4 oz (81.4 kg). BP 118/78  Pulse 82    Temp 97.9 F (36.6 C)    Resp 16    Ht 5\' 3"  (1.6 m)    Wt 179 lb 6.4 oz (81.4 kg)    SpO2 96%    BMI 31.78 kg/m  General Appearance: Well nourished, in no apparent distress.  Eyes: PERRLA, EOMs, conjunctiva no swelling or erythema Sinuses: No Frontal/maxillary tenderness  ENT/Mouth: Ext aud canals clear, normal light reflex with TMs without erythema, bulging. Good dentition. No erythema, swelling, or exudate on post pharynx. Tonsils not swollen or erythematous. Hearing normal.  Neck: Supple, thyroid normal. No bruits  Respiratory: Respiratory effort normal, BS equal bilaterally without rales, rhonchi, wheezing or stridor.  Cardio: RRR without murmurs, rubs or gallops. Brisk peripheral pulses without edema.  Chest: symmetric, with normal excursions and percussion.  Breasts: defer to GYN  Abdomen: Soft, BS x 4, non-tender no guarding, rebound, hernias, masses, or organomegaly.  Lymphatics: Non tender without lymphadenopathy.  Genitourinary: defer to GYN Musculoskeletal: Full ROM all peripheral extremities,5/5 strength, and normal gait.  Skin: Warm, dry without rashes, lesions,  ecchymosis. Neuro: Cranial nerves intact, reflexes equal bilaterally. Normal muscle tone, no cerebellar symptoms. Sensation intact.  Psych: Awake and oriented X 3, normal affect, Insight and Judgment appropriate.   EKG: NSR no ST changes.  Luis Sami W Irlene Crudup 9:18 AM [facility]

## 2021-05-12 ENCOUNTER — Other Ambulatory Visit: Payer: Self-pay | Admitting: Nurse Practitioner

## 2021-05-12 DIAGNOSIS — F418 Other specified anxiety disorders: Secondary | ICD-10-CM

## 2021-05-14 ENCOUNTER — Ambulatory Visit (INDEPENDENT_AMBULATORY_CARE_PROVIDER_SITE_OTHER): Payer: 59 | Admitting: Nurse Practitioner

## 2021-05-14 ENCOUNTER — Encounter: Payer: Self-pay | Admitting: Nurse Practitioner

## 2021-05-14 ENCOUNTER — Other Ambulatory Visit: Payer: Self-pay

## 2021-05-14 VITALS — BP 118/78 | HR 82 | Temp 97.9°F | Resp 16 | Ht 63.0 in | Wt 179.4 lb

## 2021-05-14 DIAGNOSIS — Z0000 Encounter for general adult medical examination without abnormal findings: Secondary | ICD-10-CM | POA: Diagnosis not present

## 2021-05-14 DIAGNOSIS — K760 Fatty (change of) liver, not elsewhere classified: Secondary | ICD-10-CM

## 2021-05-14 DIAGNOSIS — G4710 Hypersomnia, unspecified: Secondary | ICD-10-CM

## 2021-05-14 DIAGNOSIS — E559 Vitamin D deficiency, unspecified: Secondary | ICD-10-CM

## 2021-05-14 DIAGNOSIS — Z131 Encounter for screening for diabetes mellitus: Secondary | ICD-10-CM

## 2021-05-14 DIAGNOSIS — E669 Obesity, unspecified: Secondary | ICD-10-CM

## 2021-05-14 DIAGNOSIS — Z1389 Encounter for screening for other disorder: Secondary | ICD-10-CM

## 2021-05-14 DIAGNOSIS — Z136 Encounter for screening for cardiovascular disorders: Secondary | ICD-10-CM | POA: Diagnosis not present

## 2021-05-14 DIAGNOSIS — R0989 Other specified symptoms and signs involving the circulatory and respiratory systems: Secondary | ICD-10-CM

## 2021-05-14 DIAGNOSIS — Z79899 Other long term (current) drug therapy: Secondary | ICD-10-CM

## 2021-05-14 DIAGNOSIS — G43909 Migraine, unspecified, not intractable, without status migrainosus: Secondary | ICD-10-CM

## 2021-05-14 DIAGNOSIS — E782 Mixed hyperlipidemia: Secondary | ICD-10-CM

## 2021-05-14 DIAGNOSIS — G4730 Sleep apnea, unspecified: Secondary | ICD-10-CM

## 2021-05-14 DIAGNOSIS — Z1329 Encounter for screening for other suspected endocrine disorder: Secondary | ICD-10-CM

## 2021-05-14 DIAGNOSIS — F418 Other specified anxiety disorders: Secondary | ICD-10-CM

## 2021-05-14 DIAGNOSIS — Z0001 Encounter for general adult medical examination with abnormal findings: Secondary | ICD-10-CM

## 2021-05-14 MED ORDER — PHENTERMINE HCL 37.5 MG PO TABS: ORAL_TABLET | ORAL | 5 refills | Status: DC

## 2021-05-14 MED ORDER — TOPIRAMATE 50 MG PO TABS: 50.0000 mg | ORAL_TABLET | Freq: Two times a day (BID) | ORAL | 2 refills | Status: DC

## 2021-05-14 NOTE — Patient Instructions (Signed)
Phentermine Capsules or Tablets What is this medication? PHENTERMINE (FEN ter meen) promotes weight loss. It works by decreasing appetite. It is often used for a short period of time. Changes to diet and exercise are often combined with this medication. This medicine may be used for other purposes; ask your health care provider or pharmacist if you have questions. COMMON BRAND NAME(S): Adipex-P, Atti-Plex P, Atti-Plex P Spansule, Fastin, Lomaira, Pro-Fast, Pro-Fast HS, Pro-Fast SA, Tara-8 What should I tell my care team before I take this medication? They need to know if you have any of these conditions: Agitation or nervousness Diabetes Glaucoma Heart disease High blood pressure History of drug abuse or addiction History of stroke Kidney disease Lung disease called Primary Pulmonary Hypertension (PPH) Taken an MAOI like Carbex, Eldepryl, Marplan, Nardil, or Parnate in last 14 days Taking stimulant medications for attention disorders, weight loss, or to stay awake Thyroid disease An unusual or allergic reaction to phentermine, other medications, foods, dyes, or preservatives Pregnant or trying to get pregnant Breast-feeding How should I use this medication? Take this medication by mouth with a glass of water. Follow the directions on the prescription label. Take your medication at regular intervals. Do not take it more often than directed. Do not stop taking except on your care team's advice. Talk to your care team about the use of this medication in children. While this medication may be prescribed for children 17 years or older for selected conditions, precautions do apply. Overdosage: If you think you have taken too much of this medicine contact a poison control center or emergency room at once. NOTE: This medicine is only for you. Do not share this medicine with others. What if I miss a dose? If you miss a dose, take it as soon as you can. If it is almost time for your next dose, take  only that dose. Do not take double or extra doses. What may interact with this medication? Do not take this medication with any of the following: MAOIs like Carbex, Eldepryl, Marplan, Nardil, and Parnate This medication may also interact with the following: Alcohol Certain medications for depression, anxiety, or psychotic disorders Certain medications for high blood pressure Linezolid Medications for colds or breathing difficulties like pseudoephedrine or phenylephrine Medications for diabetes Sibutramine Stimulant medications for attention disorders, weight loss, or to stay awake This list may not describe all possible interactions. Give your health care provider a list of all the medicines, herbs, non-prescription drugs, or dietary supplements you use. Also tell them if you smoke, drink alcohol, or use illegal drugs. Some items may interact with your medicine. What should I watch for while using this medication? Visit your care team for regular checks on your progress. Do not stop taking except on your care team's advice. You may develop a severe reaction. Your care team will tell you how much medication to take. Do not take this medication close to bedtime. It may prevent you from sleeping. You may get drowsy or dizzy. Do not drive, use machinery, or do anything that needs mental alertness until you know how this medication affects you. Do not stand or sit up quickly, especially if you are an older patient. This reduces the risk of dizzy or fainting spells. Alcohol may increase dizziness and drowsiness. Avoid alcoholic drinks. This medication may affect blood sugar levels. Ask your care team if changes in diet or medications are needed if you have diabetes. Women should inform their care team if they wish to become  pregnant or think they might be pregnant. Losing weight while pregnant is not advised and may cause harm to the unborn child. Talk to your care team for more information. What side  effects may I notice from receiving this medication? Side effects that you should report to your care team as soon as possible: Allergic reactions--skin rash, itching, hives, swelling of the face, lips, tongue, or throat Heart failure--shortness of breath, swelling of the ankles, feet, or hands, sudden weight gain, unusual weakness or fatigue Pulmonary hypertension--shortness of breath, chest pain, fast or irregular heartbeat, feeling faint or lightheaded, fatigue, swelling of the ankles or feet Side effects that usually do not require medical attention (report to your care team if they continue or are bothersome): Change in taste Diarrhea Dizziness Dry mouth Restlessness Trouble sleeping This list may not describe all possible side effects. Call your doctor for medical advice about side effects. You may report side effects to FDA at 1-800-FDA-1088. Where should I keep my medication? Keep out of the reach of children. This medication can be abused. Keep your medication in a safe place to protect it from theft. Do not share this medication with anyone. Selling or giving away this medication is dangerous and against the law. This medication may cause harm and death if it is taken by other adults, children, or pets. Return medication that has not been used to an official disposal site. Contact the DEA at (724)440-8974 or your city/county government to find a site. If you cannot return the medication, mix any unused medication with a substance like cat litter or coffee grounds. Then throw the medication away in a sealed container like a sealed bag or coffee can with a lid. Do not use the medication after the expiration date. Store at room temperature between 20 and 25 degrees C (68 and 77 degrees F). Keep container tightly closed. NOTE: This sheet is a summary. It may not cover all possible information. If you have questions about this medicine, talk to your doctor, pharmacist, or health care  provider.  2022 Elsevier/Gold Standard (2020-12-15 00:00:00)

## 2021-05-15 ENCOUNTER — Other Ambulatory Visit: Payer: Self-pay | Admitting: Nurse Practitioner

## 2021-05-15 DIAGNOSIS — R7989 Other specified abnormal findings of blood chemistry: Secondary | ICD-10-CM

## 2021-05-15 LAB — COMPLETE METABOLIC PANEL WITH GFR
AG Ratio: 1.4 (calc) (ref 1.0–2.5)
ALT: 51 U/L — ABNORMAL HIGH (ref 6–29)
AST: 32 U/L — ABNORMAL HIGH (ref 10–30)
Albumin: 4.4 g/dL (ref 3.6–5.1)
Alkaline phosphatase (APISO): 51 U/L (ref 31–125)
BUN: 9 mg/dL (ref 7–25)
CO2: 27 mmol/L (ref 20–32)
Calcium: 9.1 mg/dL (ref 8.6–10.2)
Chloride: 102 mmol/L (ref 98–110)
Creat: 0.59 mg/dL (ref 0.50–0.99)
Globulin: 3.1 g/dL (calc) (ref 1.9–3.7)
Glucose, Bld: 90 mg/dL (ref 65–99)
Potassium: 3.4 mmol/L — ABNORMAL LOW (ref 3.5–5.3)
Sodium: 138 mmol/L (ref 135–146)
Total Bilirubin: 0.6 mg/dL (ref 0.2–1.2)
Total Protein: 7.5 g/dL (ref 6.1–8.1)
eGFR: 115 mL/min/{1.73_m2} (ref >=60)

## 2021-05-15 LAB — CBC WITH DIFFERENTIAL/PLATELET
Absolute Monocytes: 402 cells/uL (ref 200–950)
Basophils Absolute: 44 cells/uL (ref 0–200)
Basophils Relative: 0.6 %
Eosinophils Absolute: 212 cells/uL (ref 15–500)
Eosinophils Relative: 2.9 %
HCT: 42.1 % (ref 35.0–45.0)
Hemoglobin: 14.7 g/dL (ref 11.7–15.5)
Lymphs Abs: 1956 cells/uL (ref 850–3900)
MCH: 33 pg (ref 27.0–33.0)
MCHC: 34.9 g/dL (ref 32.0–36.0)
MCV: 94.4 fL (ref 80.0–100.0)
MPV: 10.5 fL (ref 7.5–12.5)
Monocytes Relative: 5.5 %
Neutro Abs: 4687 cells/uL (ref 1500–7800)
Neutrophils Relative %: 64.2 %
Platelets: 261 10*3/uL (ref 140–400)
RBC: 4.46 10*6/uL (ref 3.80–5.10)
RDW: 12.5 % (ref 11.0–15.0)
Total Lymphocyte: 26.8 %
WBC: 7.3 10*3/uL (ref 3.8–10.8)

## 2021-05-15 LAB — VITAMIN D 25 HYDROXY (VIT D DEFICIENCY, FRACTURES): Vit D, 25-Hydroxy: 24 ng/mL — ABNORMAL LOW (ref 30–100)

## 2021-05-15 LAB — MICROALBUMIN / CREATININE URINE RATIO
Creatinine, Urine: 109 mg/dL (ref 20–275)
Microalb Creat Ratio: 13 mcg/mg creat (ref <30)
Microalb, Ur: 1.4 mg/dL

## 2021-05-15 LAB — URINALYSIS, ROUTINE W REFLEX MICROSCOPIC
Bacteria, UA: NONE SEEN /HPF
Bilirubin Urine: NEGATIVE
Glucose, UA: NEGATIVE
Hyaline Cast: NONE SEEN /LPF
Ketones, ur: NEGATIVE
Leukocytes,Ua: NEGATIVE
Nitrite: NEGATIVE
Protein, ur: NEGATIVE
Specific Gravity, Urine: 1.017 (ref 1.001–1.035)
WBC, UA: NONE SEEN /HPF (ref 0–5)
pH: >=8.5 — AB (ref 5.0–8.0)

## 2021-05-15 LAB — HEMOGLOBIN A1C
Hgb A1c MFr Bld: 5.4 % of total Hgb (ref <5.7)
Mean Plasma Glucose: 108 mg/dL
eAG (mmol/L): 6 mmol/L

## 2021-05-15 LAB — LIPID PANEL
Cholesterol: 150 mg/dL (ref <200)
HDL: 62 mg/dL (ref >=50)
LDL Cholesterol (Calc): 69 mg/dL (calc)
Non-HDL Cholesterol (Calc): 88 mg/dL (calc) (ref <130)
Total CHOL/HDL Ratio: 2.4 (calc) (ref <5.0)
Triglycerides: 105 mg/dL (ref <150)

## 2021-05-15 LAB — TSH: TSH: 1.5 mIU/L

## 2021-05-15 LAB — MAGNESIUM: Magnesium: 2.1 mg/dL (ref 1.5–2.5)

## 2021-05-26 ENCOUNTER — Ambulatory Visit
Admission: RE | Admit: 2021-05-26 | Discharge: 2021-05-26 | Disposition: A | Discharge status: Home / Self Care | Payer: 59 | Source: Ambulatory Visit | Attending: Nurse Practitioner | Admitting: Nurse Practitioner

## 2021-05-26 ENCOUNTER — Other Ambulatory Visit: Payer: Self-pay

## 2021-05-26 DIAGNOSIS — R7989 Other specified abnormal findings of blood chemistry: Secondary | ICD-10-CM

## 2021-07-29 ENCOUNTER — Ambulatory Visit: Payer: 59 | Admitting: Nurse Practitioner

## 2021-08-06 ENCOUNTER — Other Ambulatory Visit: Payer: Self-pay | Admitting: Internal Medicine

## 2021-08-06 DIAGNOSIS — F418 Other specified anxiety disorders: Secondary | ICD-10-CM

## 2021-08-18 NOTE — Progress Notes (Signed)
FOLLOW UP ? ?Assessment and Plan:  ? ?Labile hypertension ?-Continue medications,  DASH diet, exercise and monitor at home. Call if greater than 130/80.  ?-     CBC with Differential/Platelet ?-     COMPLETE METABOLIC PANEL WITH GFR ? ?Mixed hyperlipidemia ?check lipids ?decrease fatty foods ?increase activity.  ?-     Lipid panel ? ?Fatty Liver ?Continue to work on diet and exercise ?CMP ? ?Vitamin D deficiency ?Check Vit D level ?Continue Vit D supplementation to maintain level between 60-100 ? ?Anxiety ?Continue counseling ?Continue Lexapro and Wellbutrin, will try taking one in the morning and one in the evening to see if this helps symptoms ?Use Xanax sparingly ? ?Sleep apnea with hypersomnolence ?continue mouth piece ? ?Overweight ?- follow up 3 months for progress monitoring ?- increase veggies, decrease carbs ?- long discussion about weight loss, diet, and exercise ?- Continue Phentermine and Topamax ? ?Migraine ?Currently well controlled with Topamax and Nurtec ? ? ?Continue diet and meds as discussed. Further disposition pending results of labs. ?Over 30 minutes of exam, counseling, chart review, and critical decision making was performed ? ?No future appointments. ? ? ? ?HPI ?46 y.o. female  presents for 3 month follow up on hypertension, cholesterol, prediabetes, and vitamin D deficiency.  ? ?Her migraines have not been controlled with use of Topamax and Nurtec. She is having 4 migraines a month currently, much improved.  ?  ?Her blood pressure has not been controlled at home, has checked it several times at home and at appointments with it elevated, today their BP is BP: 130/86 .  Denies headaches, chest pain, shortness of breath and dizziness. Has occasionally missed a few doses. ?BP Readings from Last 3 Encounters:  ?08/19/21 130/86  ?05/14/21 118/78  ?04/29/21 (!) 150/94  ? ? ?She has a mouth piece for her sleep apnea, now wearing it more.  ?She has anxiety, she will take xanax 1/2 a few times a  week. Lexapro 20 mg is helping with anxiety. She has been noticing that she is feeling more irritated around dinner time and asking if medication could be wearing off.  She is currently taking Wellbutrin and Lexapro together first thing in the morning.  ? ?BMI is Body mass index is 28.87 kg/m?., she is working on diet and exercise. She is down 16 pounds from last visit. She is continuing on Topamax and Phentermine. ?Wt Readings from Last 10 Encounters:  ?08/19/21 163 lb (73.9 kg)  ?05/14/21 179 lb 6.4 oz (81.4 kg)  ?04/29/21 184 lb 12.8 oz (83.8 kg)  ?03/16/21 180 lb 12.8 oz (82 kg)  ?02/09/21 178 lb 9.6 oz (81 kg)  ?01/04/21 174 lb (78.9 kg)  ?12/22/20 174 lb 2 oz (79 kg)  ?11/05/20 170 lb (77.1 kg)  ?08/28/20 165 lb (74.8 kg)  ?07/13/20 167 lb (75.8 kg)  ? ? She does not workout since her surgery, just active with the kids at this time and before the surgery yoga. She denies chest pain, shortness of breath, dizziness. ? ? She  is not  on cholesterol medication and denies myalgias. Her cholesterol is at goal. The cholesterol last visit was:   ?Lab Results  ?Component Value Date  ? CHOL 150 05/14/2021  ? HDL 62 05/14/2021  ? [place] 69 05/14/2021  ? TRIG 105 05/14/2021  ? CHOLHDL 2.4 05/14/2021  ? ?Last A1C in the office was:  ?Lab Results  ?Component Value Date  ? HGBA1C 5.4 05/14/2021  ? ?Patient is on  Vitamin D supplement.   ?Lab Results  ?Component Value Date  ? VD25OH 24 (L) 05/14/2021  ?   ? ?Current Medications:  ?Current Outpatient Medications on File Prior to Visit  ?Medication Sig  ? ALPRAZolam (XANAX) 0.25 MG tablet TAKE 1/2 TO 1 TABLET ONCE OR TWICE DAILY AS NEEDED FOR ANXIETY ATTACKS (Patient taking differently: Take 0.125-0.25 mg by mouth 2 (two) times daily as needed for anxiety.)  ? buPROPion (WELLBUTRIN XL) 150 MG 24 hr tablet TAKE 1 TABLET BY MOUTH EVERY DAY IN THE MORNING  ? Cholecalciferol (VITAMIN D) 125 MCG (5000 UT) CAPS Take 5,000 Units by mouth daily.  ? 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 famotidine (PEPCID) 40 MG tablet Take 1 tablet (40 mg total) by mouth at bedtime.  ? losartan-hydrochlorothiazide (HYZAAR) 50-12.5 MG tablet Take 1 tablet by mouth daily.  ? pantoprazole (PROTONIX) 40 MG tablet Take 1 tablet (40 mg total) by mouth 2 (two) times daily.  ? phentermine (ADIPEX-P) 37.5 MG tablet Take 1/2 to 1 tablet every morning for dieting & weightloss  ? Rimegepant Sulfate (NURTEC) 75 MG TBDP 1 tab at onset of headache, can redose in 1 hour if headache is not resolved no more than 2 in 24 hours  ? valACYclovir (VALTREX) 500 MG tablet Take 1 tablet (500 mg total) by mouth 2 (two) times daily. (Patient taking differently: Take 500 mg by mouth 2 (two) times daily as needed (outbreaks). As needed)  ? ?No current facility-administered medications on file prior to visit.  ? ? ?Medical History:  ?Past Medical History:  ?Diagnosis Date  ? Anxiety   ? Chronic cholecystitis   ? with stones  ? Diverticulosis of colon 02/14/2019  ? Fatty liver 02/14/2019  ? Per CT 02/2019  ? GERD (gastroesophageal reflux disease)   ? Headache   ? migraines  ? History of kidney stones   ? HSV-2 infection   ? Hypertension   ? Mild obstructive sleep apnea   ? study 07-12-2014 in epic,  mild osa w/ hypopnea AHI 13/hr--- 08-25-2017 per pt uses dental device  ? Pelvic pain in female   ? PONV (postoperative nausea and vomiting)   ? Wears glasses   ? ?Allergies:  ?Allergies  ?Allergen Reactions  ? Codeine Nausea And Vomiting  ? Azithromycin Nausea And Vomiting  ? Chlorhexidine Gluconate Rash  ?  CHG  ?  ? ?Review of Systems:  ?Review of Systems  ?Constitutional:  Negative for chills, fever, malaise/fatigue and weight loss.  ?HENT: Negative.  Negative for congestion, hearing loss and tinnitus.   ?Eyes: Negative.  Negative for blurred vision and double vision.  ?Respiratory: Negative.  Negative for cough and shortness of breath.   ?Cardiovascular: Negative.  Negative for chest pain, palpitations,  orthopnea and leg swelling.  ?Gastrointestinal: Negative.  Negative for abdominal pain, constipation, diarrhea, heartburn, nausea and vomiting.  ?Genitourinary: Negative.   ?Musculoskeletal: Negative.  Negative for falls, joint pain and myalgias.  ?Skin: Negative.  Negative for rash.  ?Neurological:  Positive for headaches. Negative for dizziness, tingling, tremors and loss of consciousness.  ?Psychiatric/Behavioral:  Negative for depression, memory loss and suicidal ideas. The patient is nervous/anxious.   ? ? ?Family history- Review and unchanged ?Social history- Review and unchanged ?Physical Exam: ?BP 130/86   Pulse 85   Temp 97.9 ?F (36.6 ?C)   Wt 163 lb (73.9 kg)   SpO2 98%   BMI 28.87 kg/m?  ?Wt  Readings from Last 3 Encounters:  ?08/19/21 163 lb (73.9 kg)  ?05/14/21 179 lb 6.4 oz (81.4 kg)  ?04/29/21 184 lb 12.8 oz (83.8 kg)  ? ?General Appearance: Well nourished, obese female in no apparent distress. ?Eyes: PERRLA, EOMs, conjunctiva no swelling or erythema ?Sinuses: No Frontal/maxillary tenderness ?ENT/Mouth: Ext aud canals clear, TMs without erythema, bulging. No erythema, swelling, or exudate on post pharynx.  Tonsils not swollen or erythematous. Hearing normal.  ?Neck: Supple, thyroid normal.  ?Respiratory: Respiratory effort normal, BS equal bilaterally without rales, rhonchi, wheezing or stridor.  ?Cardio: RRR with no MRGs. Brisk peripheral pulses without edema.  ?Abdomen: Soft, + BS,  Non tender, no guarding, rebound, hernias, masses. ?Lymphatics: Non tender without lymphadenopathy.  ?Musculoskeletal: Full ROM, 5/5 strength, Normal gait ?Skin: Warm, dry without rashes, lesions, ecchymosis.  ?Neuro: Cranial nerves intact. Normal muscle tone, no cerebellar symptoms. ?Psych: Awake and oriented X 3, normal affect, Insight and Judgment appropriate.  ? ? ?Magda Bernheim, NP ?11:02 AM ?[facility] ? ?

## 2021-08-19 ENCOUNTER — Ambulatory Visit (INDEPENDENT_AMBULATORY_CARE_PROVIDER_SITE_OTHER): Payer: 59 | Admitting: Nurse Practitioner

## 2021-08-19 ENCOUNTER — Other Ambulatory Visit: Payer: Self-pay | Admitting: Nurse Practitioner

## 2021-08-19 ENCOUNTER — Encounter: Payer: Self-pay | Admitting: Nurse Practitioner

## 2021-08-19 VITALS — BP 130/86 | HR 85 | Temp 97.9°F | Wt 163.0 lb

## 2021-08-19 DIAGNOSIS — G4730 Sleep apnea, unspecified: Secondary | ICD-10-CM

## 2021-08-19 DIAGNOSIS — F419 Anxiety disorder, unspecified: Secondary | ICD-10-CM

## 2021-08-19 DIAGNOSIS — E782 Mixed hyperlipidemia: Secondary | ICD-10-CM

## 2021-08-19 DIAGNOSIS — G43909 Migraine, unspecified, not intractable, without status migrainosus: Secondary | ICD-10-CM

## 2021-08-19 DIAGNOSIS — E559 Vitamin D deficiency, unspecified: Secondary | ICD-10-CM

## 2021-08-19 DIAGNOSIS — E669 Obesity, unspecified: Secondary | ICD-10-CM

## 2021-08-19 DIAGNOSIS — Z79899 Other long term (current) drug therapy: Secondary | ICD-10-CM

## 2021-08-19 DIAGNOSIS — E66811 Obesity, class 1: Secondary | ICD-10-CM

## 2021-08-19 DIAGNOSIS — R0989 Other specified symptoms and signs involving the circulatory and respiratory systems: Secondary | ICD-10-CM

## 2021-08-19 DIAGNOSIS — K760 Fatty (change of) liver, not elsewhere classified: Secondary | ICD-10-CM | POA: Diagnosis not present

## 2021-08-19 DIAGNOSIS — F418 Other specified anxiety disorders: Secondary | ICD-10-CM

## 2021-08-19 DIAGNOSIS — G4710 Hypersomnia, unspecified: Secondary | ICD-10-CM

## 2021-08-19 MED ORDER — ALPRAZOLAM 0.25 MG PO TABS: ORAL_TABLET | ORAL | 0 refills | Status: DC

## 2021-08-20 LAB — VITAMIN D 25 HYDROXY (VIT D DEFICIENCY, FRACTURES): Vit D, 25-Hydroxy: 23 ng/mL — ABNORMAL LOW (ref 30–100)

## 2021-08-20 LAB — COMPLETE METABOLIC PANEL WITH GFR
AG Ratio: 1.3 (calc) (ref 1.0–2.5)
ALT: 40 U/L — ABNORMAL HIGH (ref 6–29)
AST: 25 U/L (ref 10–30)
Albumin: 4.3 g/dL (ref 3.6–5.1)
Alkaline phosphatase (APISO): 56 U/L (ref 31–125)
BUN: 8 mg/dL (ref 7–25)
CO2: 28 mmol/L (ref 20–32)
Calcium: 9.2 mg/dL (ref 8.6–10.2)
Chloride: 103 mmol/L (ref 98–110)
Creat: 0.59 mg/dL (ref 0.50–0.99)
Globulin: 3.2 g/dL (calc) (ref 1.9–3.7)
Glucose, Bld: 93 mg/dL (ref 65–99)
Potassium: 3.9 mmol/L (ref 3.5–5.3)
Sodium: 139 mmol/L (ref 135–146)
Total Bilirubin: 0.6 mg/dL (ref 0.2–1.2)
Total Protein: 7.5 g/dL (ref 6.1–8.1)
eGFR: 115 mL/min/{1.73_m2} (ref >=60)

## 2021-08-20 LAB — CBC WITH DIFFERENTIAL/PLATELET
Absolute Monocytes: 449 cells/uL (ref 200–950)
Basophils Absolute: 41 cells/uL (ref 0–200)
Basophils Relative: 0.6 %
Eosinophils Absolute: 122 cells/uL (ref 15–500)
Eosinophils Relative: 1.8 %
HCT: 41.9 % (ref 35.0–45.0)
Hemoglobin: 14.8 g/dL (ref 11.7–15.5)
Lymphs Abs: 2122 cells/uL (ref 850–3900)
MCH: 33.9 pg — ABNORMAL HIGH (ref 27.0–33.0)
MCHC: 35.3 g/dL (ref 32.0–36.0)
MCV: 95.9 fL (ref 80.0–100.0)
MPV: 10.3 fL (ref 7.5–12.5)
Monocytes Relative: 6.6 %
Neutro Abs: 4066 cells/uL (ref 1500–7800)
Neutrophils Relative %: 59.8 %
Platelets: 260 10*3/uL (ref 140–400)
RBC: 4.37 10*6/uL (ref 3.80–5.10)
RDW: 12.4 % (ref 11.0–15.0)
Total Lymphocyte: 31.2 %
WBC: 6.8 10*3/uL (ref 3.8–10.8)

## 2021-08-20 LAB — LIPID PANEL
Cholesterol: 157 mg/dL (ref <200)
HDL: 50 mg/dL (ref >=50)
LDL Cholesterol (Calc): 79 mg/dL (calc)
Non-HDL Cholesterol (Calc): 107 mg/dL (calc) (ref <130)
Total CHOL/HDL Ratio: 3.1 (calc) (ref <5.0)
Triglycerides: 190 mg/dL — ABNORMAL HIGH (ref <150)

## 2021-09-02 ENCOUNTER — Other Ambulatory Visit: Payer: Self-pay | Admitting: Nurse Practitioner

## 2021-09-02 ENCOUNTER — Telehealth: Payer: Self-pay | Admitting: Nurse Practitioner

## 2021-09-02 DIAGNOSIS — F419 Anxiety disorder, unspecified: Secondary | ICD-10-CM

## 2021-09-02 DIAGNOSIS — H9313 Tinnitus, bilateral: Secondary | ICD-10-CM

## 2021-09-02 NOTE — Telephone Encounter (Signed)
Patient says that Alprazolam 0.25 mg is on back order at her pharmacy and they don't know when they will get that strength in stock. Can you call in a high dose and she can split it?  --also, has been having ringing in her ears and would like to be ref. To an ENT.

## 2021-09-02 NOTE — Telephone Encounter (Signed)
I would recommend she find another pharmacy to have it filled and let us know.  I will put in an ENT referral

## 2021-09-02 NOTE — Progress Notes (Signed)
Complaining of ringing in ears bilaterally and wants ENT evaluation.

## 2021-09-03 ENCOUNTER — Other Ambulatory Visit: Payer: Self-pay | Admitting: Nurse Practitioner

## 2021-09-03 DIAGNOSIS — F419 Anxiety disorder, unspecified: Secondary | ICD-10-CM

## 2021-09-06 ENCOUNTER — Other Ambulatory Visit: Payer: Self-pay | Admitting: Adult Health

## 2021-09-06 DIAGNOSIS — F418 Other specified anxiety disorders: Secondary | ICD-10-CM

## 2021-09-07 NOTE — Telephone Encounter (Signed)
Please advise patient I can not call in the 0.5.  I would like her to stay on the 0.25 mg dose.  She should call to find if they have the dose at another pharmacy

## 2021-09-14 ENCOUNTER — Other Ambulatory Visit: Payer: Self-pay | Admitting: Nurse Practitioner

## 2021-09-14 DIAGNOSIS — F419 Anxiety disorder, unspecified: Secondary | ICD-10-CM

## 2021-09-14 MED ORDER — ALPRAZOLAM 0.25 MG PO TABS: ORAL_TABLET | ORAL | 0 refills | Status: AC

## 2021-09-14 NOTE — Progress Notes (Signed)
PDMP is reviewed and appropriate  

## 2021-09-16 ENCOUNTER — Ambulatory Visit: Payer: 59 | Admitting: Nurse Practitioner

## 2021-10-20 ENCOUNTER — Encounter: Payer: Self-pay | Admitting: Nurse Practitioner

## 2021-11-02 ENCOUNTER — Other Ambulatory Visit: Payer: Self-pay | Admitting: Nurse Practitioner

## 2021-11-02 DIAGNOSIS — F418 Other specified anxiety disorders: Secondary | ICD-10-CM

## 2021-11-13 ENCOUNTER — Other Ambulatory Visit: Payer: Self-pay | Admitting: Nurse Practitioner

## 2021-11-13 DIAGNOSIS — E669 Obesity, unspecified: Secondary | ICD-10-CM

## 2021-11-27 ENCOUNTER — Other Ambulatory Visit: Payer: Self-pay | Admitting: Nurse Practitioner

## 2021-11-27 DIAGNOSIS — F418 Other specified anxiety disorders: Secondary | ICD-10-CM

## 2021-11-28 ENCOUNTER — Other Ambulatory Visit: Payer: Self-pay | Admitting: Internal Medicine

## 2021-11-28 DIAGNOSIS — F418 Other specified anxiety disorders: Secondary | ICD-10-CM

## 2021-11-28 MED ORDER — BUPROPION HCL ER (XL) 150 MG PO TB24: ORAL_TABLET | ORAL | 3 refills | Status: DC

## 2021-12-01 ENCOUNTER — Other Ambulatory Visit: Payer: Self-pay

## 2021-12-01 DIAGNOSIS — E669 Obesity, unspecified: Secondary | ICD-10-CM

## 2021-12-01 MED ORDER — TOPIRAMATE 50 MG PO TABS: 50.0000 mg | ORAL_TABLET | Freq: Two times a day (BID) | ORAL | 2 refills | Status: DC

## 2021-12-03 NOTE — Progress Notes (Unsigned)
FOLLOW UP  Assessment and Plan:   Essential hypertension -Continue medications,  DASH diet, exercise and monitor at home. Call if greater than 130/80.  Go to the ER if any chest pain, shortness of breath, nausea, dizziness, severe HA, changes vision/speech  -     CBC with Differential/Platelet -     COMPLETE METABOLIC PANEL WITH GFR  Mixed hyperlipidemia check lipids decrease fatty foods increase activity.  -     Lipid panel  Fatty Liver Continue to work on diet and exercise CMP  Vitamin D deficiency Check Vit D level Continue Vit D supplementation to maintain level between 60-100  Anxiety Continue counseling Continue Lexapro and Wellbutrin, will try taking one in the morning and one in the evening to see if this helps symptoms Use Xanax sparingly  Sleep apnea with hypersomnolence continue mouth piece  Overweight - follow up 3 months for progress monitoring - increase veggies, decrease carbs - long discussion about weight loss, diet, and exercise - Continue Phentermine and Topamax, has noticed some facial tics , advised to decrease Phentermine to 1/2 tab in AM and 1/2 tab at lunch- monitor  Migraine Currently well controlled with Topamax and Nurtec   Continue diet and meds as discussed. Further disposition pending results of labs. Over 30 minutes of exam, counseling, chart review, and critical decision making was performed  No future appointments.     HPI 46 y.o. female  presents for 3 month follow up on hypertension, cholesterol, prediabetes, and vitamin D deficiency.   Her migraines have not been controlled with use of Topamax and Nurtec. She is having 4 migraines a month currently, much improved.    Her blood pressure remains elevated in 130's/80-90 at home. Today their BP is BP: 124/78 .  Denies headaches, chest pain, shortness of breath and dizziness. Has occasionally missed a few doses. BP Readings from Last 3 Encounters:  12/06/21 124/78  08/19/21  130/86  05/14/21 118/78    She has a mouth piece for her sleep apnea, now wearing it more.  She has anxiety, she will take xanax 1/2 a few times a week. Lexapro 20 mg is helping with anxiety.    BMI is Body mass index is 28.91 kg/m., she has been working on diet and exercise. She is currently on Phentermine 37.5 mg daily and Topamax 1 tab BID.  Has kept her weight equal since last visit. She has noticed some lip tics in the past several months and is questioning Phentermine Wt Readings from Last 3 Encounters:  12/06/21 163 lb 3.2 oz (74 kg)  08/19/21 163 lb (73.9 kg)  05/14/21 179 lb 6.4 oz (81.4 kg)      She  is not  on cholesterol medication and denies myalgias. Her cholesterol is at goal. The cholesterol last visit was:   Lab Results  Component Value Date   CHOL 157 08/19/2021   HDL 50 08/19/2021   LDLCALC 79 08/19/2021   TRIG 190 (H) 08/19/2021   CHOLHDL 3.1 08/19/2021   Last A1C in the office was:  Lab Results  Component Value Date   HGBA1C 5.4 05/14/2021   Patient is on Vitamin D supplement 5000 units daily- she is not taking her supplement daily Lab Results  Component Value Date   VD25OH 23 (L) 08/19/2021      Current Medications:  Current Outpatient Medications on File Prior to Visit  Medication Sig   ALPRAZolam (XANAX) 0.25 MG tablet TAKE 1/2 TO 1 TABLET ONCE OR TWICE DAILY  AS NEEDED FOR ANXIETY ATTACKS   buPROPion (WELLBUTRIN XL) 150 MG 24 hr tablet Take  1 tablet  every Morning  for Mood, Focus & Concentration       (Dx:   f41.8 )                  /     TAKE                          BY                          MOUTH   Cholecalciferol (VITAMIN D) 125 MCG (5000 UT) CAPS Take 5,000 Units by mouth daily.   escitalopram (LEXAPRO) 20 MG tablet Take  1 tablet  Daily  for Mood  (Dx: f41.8)   famotidine (PEPCID) 40 MG tablet Take 1 tablet (40 mg total) by mouth at bedtime.   losartan-hydrochlorothiazide (HYZAAR) 50-12.5 MG tablet Take 1 tablet by mouth daily.    pantoprazole (PROTONIX) 40 MG tablet Take 1 tablet (40 mg total) by mouth 2 (two) times daily.   phentermine (ADIPEX-P) 37.5 MG tablet TAKE 1/2 TO 1 TABLET EVERY MORNING FOR DIETING & WEIGHTLOSS   Rimegepant Sulfate (NURTEC) 75 MG TBDP 1 tab at onset of headache, can redose in 1 hour if headache is not resolved no more than 2 in 24 hours   topiramate (TOPAMAX) 50 MG tablet Take 1 tablet (50 mg total) by mouth 2 (two) times daily.   valACYclovir (VALTREX) 500 MG tablet Take 1 tablet (500 mg total) by mouth 2 (two) times daily. (Patient taking differently: Take 500 mg by mouth 2 (two) times daily as needed (outbreaks). As needed)   No current facility-administered medications on file prior to visit.    Medical History:  Past Medical History:  Diagnosis Date   Anxiety    Chronic cholecystitis    with stones   Diverticulosis of colon 02/14/2019   Fatty liver 02/14/2019   Per CT 02/2019   GERD (gastroesophageal reflux disease)    Headache    migraines   History of kidney stones    HSV-2 infection    Hypertension    Mild obstructive sleep apnea    study 07-12-2014 in epic,  mild osa w/ hypopnea AHI 13/hr--- 08-25-2017 per pt uses dental device   Pelvic pain in female    PONV (postoperative nausea and vomiting)    Wears glasses    Allergies:  Allergies  Allergen Reactions   Codeine Nausea And Vomiting   Azithromycin Nausea And Vomiting   Chlorhexidine Gluconate Rash    CHG     Review of Systems:  Review of Systems  Constitutional:  Negative for chills, fever, malaise/fatigue and weight loss.  HENT: Negative.  Negative for congestion, hearing loss and tinnitus.   Eyes: Negative.  Negative for blurred vision and double vision.  Respiratory: Negative.  Negative for cough and shortness of breath.   Cardiovascular: Negative.  Negative for chest pain, palpitations, orthopnea and leg swelling.  Gastrointestinal: Negative.  Negative for abdominal pain, constipation, diarrhea, heartburn,  nausea and vomiting.  Genitourinary: Negative.   Musculoskeletal: Negative.  Negative for falls, joint pain and myalgias.  Skin: Negative.  Negative for rash.  Neurological:  Positive for headaches. Negative for dizziness, tingling, tremors and loss of consciousness.  Psychiatric/Behavioral:  Negative for depression, memory loss and suicidal ideas. The patient is nervous/anxious.  Lip tics     Family history- Review and unchanged Social history- Review and unchanged Physical Exam: BP 124/78   Pulse 81   Temp 97.7 F (36.5 C)   Ht '5\' 3"'$  (1.6 m)   Wt 163 lb 3.2 oz (74 kg)   SpO2 97%   BMI 28.91 kg/m  Wt Readings from Last 3 Encounters:  12/06/21 163 lb 3.2 oz (74 kg)  08/19/21 163 lb (73.9 kg)  05/14/21 179 lb 6.4 oz (81.4 kg)   General Appearance: Well nourished, obese female in no apparent distress. Eyes: PERRLA, EOMs, conjunctiva no swelling or erythema Sinuses: No Frontal/maxillary tenderness ENT/Mouth: Ext aud canals clear, TMs without erythema, bulging. No erythema, swelling, or exudate on post pharynx.  Tonsils not swollen or erythematous. Hearing normal.  Neck: Supple, thyroid normal.  Respiratory: Respiratory effort normal, BS equal bilaterally without rales, rhonchi, wheezing or stridor.  Cardio: RRR with no MRGs. Brisk peripheral pulses without edema.  Abdomen: Soft, + BS,  Non tender, no guarding, rebound, hernias, masses. Lymphatics: Non tender without lymphadenopathy.  Musculoskeletal: Full ROM, 5/5 strength, Normal gait Skin: Warm, dry without rashes, lesions, ecchymosis.  Neuro: Cranial nerves intact. Normal muscle tone, no cerebellar symptoms. Psych: Awake and oriented X 3, normal affect, Insight and Judgment appropriate.    Alycia Rossetti, NP 12:27 PM [facility]

## 2021-12-06 ENCOUNTER — Encounter: Payer: Self-pay | Admitting: Nurse Practitioner

## 2021-12-06 ENCOUNTER — Ambulatory Visit: Payer: 59 | Admitting: Nurse Practitioner

## 2021-12-06 ENCOUNTER — Ambulatory Visit (INDEPENDENT_AMBULATORY_CARE_PROVIDER_SITE_OTHER): Payer: 59 | Admitting: Nurse Practitioner

## 2021-12-06 VITALS — BP 124/78 | HR 81 | Temp 97.7°F | Ht 63.0 in | Wt 163.2 lb

## 2021-12-06 DIAGNOSIS — E559 Vitamin D deficiency, unspecified: Secondary | ICD-10-CM | POA: Diagnosis not present

## 2021-12-06 DIAGNOSIS — F419 Anxiety disorder, unspecified: Secondary | ICD-10-CM

## 2021-12-06 DIAGNOSIS — K760 Fatty (change of) liver, not elsewhere classified: Secondary | ICD-10-CM | POA: Diagnosis not present

## 2021-12-06 DIAGNOSIS — G43909 Migraine, unspecified, not intractable, without status migrainosus: Secondary | ICD-10-CM

## 2021-12-06 DIAGNOSIS — G4710 Hypersomnia, unspecified: Secondary | ICD-10-CM

## 2021-12-06 DIAGNOSIS — I10 Essential (primary) hypertension: Secondary | ICD-10-CM

## 2021-12-06 DIAGNOSIS — E782 Mixed hyperlipidemia: Secondary | ICD-10-CM

## 2021-12-06 DIAGNOSIS — G4730 Sleep apnea, unspecified: Secondary | ICD-10-CM

## 2021-12-06 DIAGNOSIS — Z79899 Other long term (current) drug therapy: Secondary | ICD-10-CM

## 2021-12-06 DIAGNOSIS — E663 Overweight: Secondary | ICD-10-CM

## 2021-12-07 ENCOUNTER — Encounter: Payer: Self-pay | Admitting: Nurse Practitioner

## 2021-12-07 ENCOUNTER — Other Ambulatory Visit: Payer: Self-pay | Admitting: Nurse Practitioner

## 2021-12-07 DIAGNOSIS — K760 Fatty (change of) liver, not elsewhere classified: Secondary | ICD-10-CM

## 2021-12-07 LAB — COMPLETE METABOLIC PANEL WITH GFR
AG Ratio: 1.4 (calc) (ref 1.0–2.5)
ALT: 43 U/L — ABNORMAL HIGH (ref 6–29)
AST: 28 U/L (ref 10–30)
Albumin: 4.3 g/dL (ref 3.6–5.1)
Alkaline phosphatase (APISO): 54 U/L (ref 31–125)
BUN: 9 mg/dL (ref 7–25)
CO2: 26 mmol/L (ref 20–32)
Calcium: 9.3 mg/dL (ref 8.6–10.2)
Chloride: 101 mmol/L (ref 98–110)
Creat: 0.58 mg/dL (ref 0.50–0.99)
Globulin: 3.1 g/dL (calc) (ref 1.9–3.7)
Glucose, Bld: 80 mg/dL (ref 65–99)
Potassium: 3.4 mmol/L — ABNORMAL LOW (ref 3.5–5.3)
Sodium: 138 mmol/L (ref 135–146)
Total Bilirubin: 0.6 mg/dL (ref 0.2–1.2)
Total Protein: 7.4 g/dL (ref 6.1–8.1)
eGFR: 115 mL/min/{1.73_m2} (ref >=60)

## 2021-12-07 LAB — CBC WITH DIFFERENTIAL/PLATELET
Absolute Monocytes: 571 cells/uL (ref 200–950)
Basophils Absolute: 42 cells/uL (ref 0–200)
Basophils Relative: 0.5 %
Eosinophils Absolute: 118 cells/uL (ref 15–500)
Eosinophils Relative: 1.4 %
HCT: 40.8 % (ref 35.0–45.0)
Hemoglobin: 14.4 g/dL (ref 11.7–15.5)
Lymphs Abs: 2335 cells/uL (ref 850–3900)
MCH: 33.3 pg — ABNORMAL HIGH (ref 27.0–33.0)
MCHC: 35.3 g/dL (ref 32.0–36.0)
MCV: 94.2 fL (ref 80.0–100.0)
MPV: 9.7 fL (ref 7.5–12.5)
Monocytes Relative: 6.8 %
Neutro Abs: 5334 cells/uL (ref 1500–7800)
Neutrophils Relative %: 63.5 %
Platelets: 268 10*3/uL (ref 140–400)
RBC: 4.33 10*6/uL (ref 3.80–5.10)
RDW: 12 % (ref 11.0–15.0)
Total Lymphocyte: 27.8 %
WBC: 8.4 10*3/uL (ref 3.8–10.8)

## 2021-12-07 LAB — VITAMIN D 25 HYDROXY (VIT D DEFICIENCY, FRACTURES): Vit D, 25-Hydroxy: 35 ng/mL (ref 30–100)

## 2021-12-07 LAB — LIPID PANEL
Cholesterol: 169 mg/dL (ref <200)
HDL: 62 mg/dL (ref >=50)
LDL Cholesterol (Calc): 82 mg/dL (calc)
Non-HDL Cholesterol (Calc): 107 mg/dL (calc) (ref <130)
Total CHOL/HDL Ratio: 2.7 (calc) (ref <5.0)
Triglycerides: 150 mg/dL — ABNORMAL HIGH (ref <150)

## 2021-12-16 ENCOUNTER — Ambulatory Visit
Admission: RE | Admit: 2021-12-16 | Discharge: 2021-12-16 | Disposition: A | Discharge status: Home / Self Care | Payer: 59 | Source: Ambulatory Visit | Attending: Nurse Practitioner | Admitting: Nurse Practitioner

## 2021-12-16 ENCOUNTER — Other Ambulatory Visit: Payer: Self-pay | Admitting: Nurse Practitioner

## 2021-12-16 DIAGNOSIS — K769 Liver disease, unspecified: Secondary | ICD-10-CM

## 2021-12-16 DIAGNOSIS — K760 Fatty (change of) liver, not elsewhere classified: Secondary | ICD-10-CM

## 2021-12-19 ENCOUNTER — Other Ambulatory Visit: Payer: Self-pay | Admitting: Nurse Practitioner

## 2021-12-19 DIAGNOSIS — E669 Obesity, unspecified: Secondary | ICD-10-CM

## 2022-01-12 ENCOUNTER — Other Ambulatory Visit: Payer: Self-pay | Admitting: Internal Medicine

## 2022-01-12 DIAGNOSIS — Z1231 Encounter for screening mammogram for malignant neoplasm of breast: Secondary | ICD-10-CM

## 2022-01-13 ENCOUNTER — Telehealth: Payer: Self-pay

## 2022-01-13 NOTE — Telephone Encounter (Signed)
Nurtec prior auth approved

## 2022-01-19 NOTE — Telephone Encounter (Signed)
Prior auth approved through 01/13/23.

## 2022-02-01 ENCOUNTER — Other Ambulatory Visit: Payer: Self-pay | Admitting: Nurse Practitioner

## 2022-02-01 DIAGNOSIS — E66811 Obesity, class 1: Secondary | ICD-10-CM

## 2022-02-01 DIAGNOSIS — E669 Obesity, unspecified: Secondary | ICD-10-CM

## 2022-02-21 ENCOUNTER — Ambulatory Visit
Admission: RE | Admit: 2022-02-21 | Discharge: 2022-02-21 | Disposition: A | Discharge status: Home / Self Care | Payer: 59 | Source: Ambulatory Visit | Attending: Internal Medicine | Admitting: Internal Medicine

## 2022-02-21 DIAGNOSIS — Z1231 Encounter for screening mammogram for malignant neoplasm of breast: Secondary | ICD-10-CM

## 2022-03-13 ENCOUNTER — Other Ambulatory Visit: Payer: Self-pay | Admitting: Nurse Practitioner

## 2022-03-13 DIAGNOSIS — E669 Obesity, unspecified: Secondary | ICD-10-CM

## 2022-03-13 DIAGNOSIS — E66811 Obesity, class 1: Secondary | ICD-10-CM

## 2022-03-18 ENCOUNTER — Other Ambulatory Visit: Payer: Self-pay | Admitting: Nurse Practitioner

## 2022-03-18 DIAGNOSIS — E669 Obesity, unspecified: Secondary | ICD-10-CM

## 2022-04-15 ENCOUNTER — Other Ambulatory Visit: Payer: Self-pay | Admitting: Gastroenterology

## 2022-04-15 DIAGNOSIS — R1310 Dysphagia, unspecified: Secondary | ICD-10-CM

## 2022-05-04 ENCOUNTER — Other Ambulatory Visit: Payer: Self-pay | Admitting: Nurse Practitioner

## 2022-05-04 DIAGNOSIS — E669 Obesity, unspecified: Secondary | ICD-10-CM

## 2022-05-04 DIAGNOSIS — R0989 Other specified symptoms and signs involving the circulatory and respiratory systems: Secondary | ICD-10-CM

## 2022-05-06 ENCOUNTER — Encounter: Payer: 59 | Admitting: Adult Health

## 2022-05-13 ENCOUNTER — Ambulatory Visit: Payer: 59 | Admitting: Nurse Practitioner

## 2022-05-25 NOTE — Progress Notes (Deleted)
Complete Physical  Assessment and Plan:  Encounter for general adult medical examination with abnormal findings 1 year  Fatty liver Check labs, avoid tylenol, alcohol, weight loss advised.   Mixed hyperlipidemia -     Lipid Profile check lipids decrease fatty foods increase activity.   Essential hypertension -     Hyzaar 50/12.32m, well controlled - continue medications, DASH diet, exercise and monitor at home. Call if greater than 130/80.  -     CBC with Diff -     COMPLETE METABOLIC PANEL WITH GFR -     TSH -     Urinalysis, Routine w reflex microscopic -     Microalbumin / Creatinine Urine Ratio  Sleep apnea with hypersomnolence Continue to follow up Dr. FToy Cookey continue oral device If persistent apnea can refer for another sleep study  Depression with anxiety Start new medication as prescribed; celexa 20 mg tabs per patient preference as has tolerated in the past; 1/2 tab daily x 2 weeks then whole tab daily  Stress management techniques discussed, increase water, good sleep hygiene discussed, increase exercise, and increase veggies.  Follow up 2 month, call the office if any new AE's from medications and we will switch them  Obesity (BMI 30.0-34.9) Begin Topamax 50 mg and Phentermine 37.5 mg daily Recommended diet heavy in fruits and veggies and low in animal meats, cheeses, and dairy products, appropriate calorie intake Follow up 3 month  Medication management -     Magnesium  Vitamin D deficiency -     Vitamin D (25 hydroxy)  Migraine/ daily headache Nurtec as needed Topamax 50 mg daily Stress management reviewed; lifestyle reviewed Has had vision check   Screening for ischemic heart disease -EKG  Abnormal Glucose Continue diet and exercise -     Hemoglobin A1c ([facility])  Screening for thyroid disorder - TSH  Screening for hematuria or proteinuria - Routine UA with reflex microscopic - Microablumin/creatinne urine ratio    Discussed med's  effects and SE's. Screening labs and tests as requested with regular follow-up as recommended. Over 40 minutes of exam, counseling, chart review, and complex, high level critical decision making was performed this visit.   Future Appointments  Date Time Provider [place] 05/26/2022  2:00 PM WAlycia Rossetti NP [facility] None  05/30/2023  2:00 PM WAlycia Rossetti NP [facility] None    HPI  47y.o. female  presents for a complete physical and follow up for has Sleep apnea with hypersomnolence; Depression with anxiety; Hyperlipidemia; Labile hypertension; Obesity (BMI 30.0-34.9); Fatty liver; Uterine fibroid; Diverticulosis of colon; Migraines; and Esophageal dysphagia on their problem list.   She is still going through a separation, stress going through holidays, 2 daughters and a younger son. She is currently well controlled on Wellbutrin and Lexapro. She has hx of depression/anxiety been seeing a therapist since 2020, she is taking the xanax AS needed 1/2-1 tablet 2-3 x a week.  She has had a history of hematuria, had cysto 10+ years ago, her last CT scan in 02/14/2019 showed bladder wall thickening suggestive of cystitis, followed up with urology with no changes.   She had gallstones and fatty liver on the CT, [place] 04[place] showed Cholelithiasis, Nonspecific 9 mm hypoechoic hepatic nodule grossly stable in size since 02/14/2019 and had cholecystectomy 08/01/2019, reports has had intermittent unpredictable episodes of diarrhea, trying to track and adjust diet but interested in med to help this that was mentioned by surgeon. She has follow up appointment  with Dr. Tarri Glenn  She has hx of frequent headaches; reports unilateral with vision changes and may have nausea; typical migraine. She has been on topiramate 50 mg with significant improvement, but recently having near daily HA in afternoon, taking ibuprofen. In the past, still has some and just recently restarted and needs prescription  refilled  Has sleep apnea, has mouth piece with Dr. Toy Cookey, sleep study 2016.    BMI is There is no height or weight on file to calculate BMI., she is working on diet and exercise. She was going to start [facility] but is 1100, she has never tried Phentermine.  [facility] and is doing more exercises.  Wt Readings from Last 3 Encounters:  12/06/21 163 lb 3.2 oz (74 kg)  08/19/21 163 lb (73.9 kg)  05/14/21 179 lb 6.4 oz (81.4 kg)   Has history of palpitations, normal holter 2014.  First MI in father at 83, died at 48 from MI, was alcoholic.  Her blood pressure has been controlled at home, BP is doing well with an increase in the metoprolol XL to 25 mg a day, today their BP is   BP Readings from Last 3 Encounters:  12/06/21 124/78  08/19/21 130/86  05/14/21 118/78    She does not workout, she does some walking. She denies chest pain, shortness of breath, dizziness.    She is not on cholesterol medication and denies myalgias. Her cholesterol is at goal. The cholesterol last visit was:   Lab Results  Component Value Date   CHOL 169 12/06/2021   HDL 62 12/06/2021   LDLCALC 82 12/06/2021   TRIG 150 (H) 12/06/2021   CHOLHDL 2.7 12/06/2021    Last A1C in the office was:  Lab Results  Component Value Date   HGBA1C 5.4 05/14/2021   Patient is on Vitamin D supplement, taking 5000 IU daily    Lab Results  Component Value Date   VD25OH 35 12/06/2021     Lab Results  Component Value Date   [id number] 05/06/2020   She is s/p endometrial ablation and BSO and she has felt better since that time.  Lab Results  Component Value Date   IRON 109 03/19/2018   TIBC 353 03/19/2018   Lab Results  Component Value Date   ALT 43 (H) 12/06/2021      Current Medications:    Current Outpatient Medications (Cardiovascular):    losartan-hydrochlorothiazide (HYZAAR) 50-12.5 MG tablet, TAKE 1 TABLET BY MOUTH EVERY DAY   Current Outpatient Medications (Analgesics):    Rimegepant  Sulfate (NURTEC) 75 MG TBDP, 1 tab at onset of headache, can redose in 1 hour if headache is not resolved no more than 2 in 24 hours   Current Outpatient Medications (Other):    ALPRAZolam (XANAX) 0.25 MG tablet, TAKE 1/2 TO 1 TABLET ONCE OR TWICE DAILY AS NEEDED FOR ANXIETY ATTACKS   buPROPion (WELLBUTRIN XL) 150 MG 24 hr tablet, Take  1 tablet  every Morning  for Mood, Focus & Concentration       (Dx:   f41.8 )                  /     TAKE                          BY  MOUTH   Cholecalciferol (VITAMIN D) 125 MCG (5000 UT) CAPS, Take 5,000 Units by mouth daily.   escitalopram (LEXAPRO) 20 MG tablet, Take  1 tablet  Daily  for Mood  (Dx: f41.8)   famotidine (PEPCID) 40 MG tablet, Take 1 tablet (40 mg total) by mouth at bedtime.   pantoprazole (PROTONIX) 40 MG tablet, TAKE 1 TABLET BY MOUTH TWICE A DAY   phentermine (ADIPEX-P) 37.5 MG tablet, TAKE 0.5 TO 1 TABLET BY MOUTH EVERY MORNING FOR DIETING AND WEIGHT LOSS   topiramate (TOPAMAX) 50 MG tablet, TAKE 1 TABLET BY MOUTH TWICE A DAY   valACYclovir (VALTREX) 500 MG tablet, Take 1 tablet (500 mg total) by mouth 2 (two) times daily. (Patient taking differently: Take 500 mg by mouth 2 (two) times daily as needed (outbreaks). As needed)  Allergies:  Allergies  Allergen Reactions   Codeine Nausea And Vomiting   Azithromycin Nausea And Vomiting   Chlorhexidine Gluconate Rash    CHG   Medical History:  She has Sleep apnea with hypersomnolence; Depression with anxiety; Hyperlipidemia; Labile hypertension; Obesity (BMI 30.0-34.9); Fatty liver; Uterine fibroid; Diverticulosis of colon; Migraines; and Esophageal dysphagia on their problem list.   Health Maintenance:   Immunization History  Administered Date(s) Administered   Influenza-Unspecified 01/12/2015   PPD Test 11/11/2013   Td 05/06/2020   Tdap 04/11/2010   Tetanus: 2022 Pneumovax: n/a Flu vaccine: 2022 at work  Shingrix: discuss age 7 Covid 20: not yet,  considering, no questions   No LMP recorded. Pap: 2020, q3 years Dr. Willis Modena MGM: 02/18/21   Colonoscopy: due age 76  Sleep study 2016  Last vision: 2022, [place] [facility], glasses occasionally  Dental: [place], last 2022, goes q56m Patient Care Team: MUnk Pinto MD as PCP - General (Internal Medicine) NRozetta Nunnery MD (Inactive) as Consulting Physician (Otolaryngology) MCheri Fowler MD as Consulting Physician (Obstetrics and Gynecology) [place]get H[place]MEduardo Osier, MD as Attending Physician (Urology) CEarlie Server MD as Consulting Physician (Orthopedic Surgery)  Surgical History:  She has a past surgical history that includes Mass excision (Left, 01/14/2016); Dilation and curettage of uterus (age 47; Essure tubal ligation (Bilateral, Jan or Feb 2012   dr meisinger office); Laparoscopic bilateral salpingectomy (Bilateral, 09/01/2017); Endometrial ablation (N/A, 09/01/2017); Wisdom tooth extraction; Cholecystectomy (N/A, 08/01/2019); and Breast cyst excision (Left). Family History:  Herfamily history includes Alcohol abuse in her father; Anxiety disorder in her daughter; Autism in her daughter; Breast cancer in her paternal aunt and paternal aunt; Cancer in her paternal grandmother; Diabetes in her paternal uncle; Heart disease in her father and maternal grandmother; Hyperlipidemia in her father; Hypertension in her father and mother; Stroke in her paternal uncle. Social History:  She reports that she has never smoked. She has never used smokeless tobacco. She reports that she does not currently use alcohol. She reports that she does not use drugs.   Review of Systems: Review of Systems  Constitutional:  Negative for malaise/fatigue and weight loss.  HENT:  Negative for hearing loss and tinnitus.   Eyes:  Negative for blurred vision and double vision.  Respiratory:  Negative for cough, shortness of breath and wheezing.   Cardiovascular:  Negative for chest pain,  palpitations, orthopnea, claudication and leg swelling.  Gastrointestinal:  Positive for diarrhea. Negative for abdominal pain, blood in stool, constipation, heartburn, melena, nausea and vomiting.  Genitourinary: Negative.   Musculoskeletal:  Negative for joint pain and myalgias.  Skin:  Negative for rash.  Neurological:  Positive for  headaches (nearly daily, unilateral with vision changes and intermittent nausea). Negative for dizziness, tingling, sensory change and weakness.  Endo/Heme/Allergies:  Negative for polydipsia.  Psychiatric/Behavioral:  Positive for depression. Negative for hallucinations, memory loss, substance abuse and suicidal ideas. The patient is nervous/anxious. The patient does not have insomnia.   All other systems reviewed and are negative.   Physical Exam: Estimated body mass index is 28.91 kg/m as calculated from the following:   Height as of 12/06/21: 5' 3"$  (1.6 m).   Weight as of 12/06/21: 163 lb 3.2 oz (74 kg). There were no vitals taken for this visit. General Appearance: Well nourished, in no apparent distress.  Eyes: PERRLA, EOMs, conjunctiva no swelling or erythema Sinuses: No Frontal/maxillary tenderness  ENT/Mouth: Ext aud canals clear, normal light reflex with TMs without erythema, bulging. Good dentition. No erythema, swelling, or exudate on post pharynx. Tonsils not swollen or erythematous. Hearing normal.  Neck: Supple, thyroid normal. No bruits  Respiratory: Respiratory effort normal, BS equal bilaterally without rales, rhonchi, wheezing or stridor.  Cardio: RRR without murmurs, rubs or gallops. Brisk peripheral pulses without edema.  Chest: symmetric, with normal excursions and percussion.  Breasts: defer to GYN  Abdomen: Soft, BS x 4, non-tender no guarding, rebound, hernias, masses, or organomegaly.  Lymphatics: Non tender without lymphadenopathy.  Genitourinary: defer to GYN Musculoskeletal: Full ROM all peripheral extremities,5/5 strength, and  normal gait.  Skin: Warm, dry without rashes, lesions, ecchymosis. Neuro: Cranial nerves intact, reflexes equal bilaterally. Normal muscle tone, no cerebellar symptoms. Sensation intact.  Psych: Awake and oriented X 3, normal affect, Insight and Judgment appropriate.   EKG: NSR no ST changes.  Charman Blasco E  12:54 PM [facility]

## 2022-05-26 ENCOUNTER — Encounter: Payer: 59 | Admitting: Nurse Practitioner

## 2022-05-26 DIAGNOSIS — R7309 Other abnormal glucose: Secondary | ICD-10-CM

## 2022-05-26 DIAGNOSIS — I10 Essential (primary) hypertension: Secondary | ICD-10-CM

## 2022-05-26 DIAGNOSIS — Z79899 Other long term (current) drug therapy: Secondary | ICD-10-CM

## 2022-05-26 DIAGNOSIS — R7989 Other specified abnormal findings of blood chemistry: Secondary | ICD-10-CM

## 2022-05-26 DIAGNOSIS — Z136 Encounter for screening for cardiovascular disorders: Secondary | ICD-10-CM

## 2022-05-26 DIAGNOSIS — Z0001 Encounter for general adult medical examination with abnormal findings: Secondary | ICD-10-CM

## 2022-05-26 DIAGNOSIS — Z1389 Encounter for screening for other disorder: Secondary | ICD-10-CM

## 2022-05-26 DIAGNOSIS — K760 Fatty (change of) liver, not elsewhere classified: Secondary | ICD-10-CM

## 2022-05-26 DIAGNOSIS — G4710 Hypersomnia, unspecified: Secondary | ICD-10-CM

## 2022-05-26 DIAGNOSIS — E663 Overweight: Secondary | ICD-10-CM

## 2022-05-26 DIAGNOSIS — G43909 Migraine, unspecified, not intractable, without status migrainosus: Secondary | ICD-10-CM

## 2022-05-26 DIAGNOSIS — Z1329 Encounter for screening for other suspected endocrine disorder: Secondary | ICD-10-CM

## 2022-05-26 DIAGNOSIS — F418 Other specified anxiety disorders: Secondary | ICD-10-CM

## 2022-05-26 DIAGNOSIS — E559 Vitamin D deficiency, unspecified: Secondary | ICD-10-CM

## 2022-05-26 DIAGNOSIS — E782 Mixed hyperlipidemia: Secondary | ICD-10-CM

## 2022-06-07 NOTE — Progress Notes (Deleted)
Complete Physical  Assessment and Plan:  Encounter for general adult medical examination with abnormal findings 1 year  Fatty liver Check labs, avoid tylenol, alcohol, weight loss advised.   Mixed hyperlipidemia -     Lipid Profile check lipids decrease fatty foods increase activity.   Essential hypertension -     Hyzaar 50/12.'5mg'$ , well controlled - continue medications, DASH diet, exercise and monitor at home. Call if greater than 130/80.  -     CBC with Diff -     COMPLETE METABOLIC PANEL WITH GFR -     TSH -     Urinalysis, Routine w reflex microscopic -     Microalbumin / Creatinine Urine Ratio  Sleep apnea with hypersomnolence Continue to follow up Dr. Toy Cookey, continue oral device If persistent apnea can refer for another sleep study  Depression with anxiety Start new medication as prescribed; celexa 20 mg tabs per patient preference as has tolerated in the past; 1/2 tab daily x 2 weeks then whole tab daily  Stress management techniques discussed, increase water, good sleep hygiene discussed, increase exercise, and increase veggies.  Follow up 2 month, call the office if any new AE's from medications and we will switch them  Obesity (BMI 30.0-34.9) Begin Topamax 50 mg and Phentermine 37.5 mg daily Recommended diet heavy in fruits and veggies and low in animal meats, cheeses, and dairy products, appropriate calorie intake Follow up 3 month  Medication management -     Magnesium  Vitamin D deficiency -     Vitamin D (25 hydroxy)  Migraine/ daily headache Nurtec as needed Topamax 50 mg daily Stress management reviewed; lifestyle reviewed Has had vision check   Screening for ischemic heart disease -EKG  Abnormal Glucose Continue diet and exercise -     Hemoglobin A1c ([facility])  Screening for thyroid disorder - TSH  Screening for hematuria or proteinuria - Routine UA with reflex microscopic - Microablumin/creatinne urine ratio    Discussed med's  effects and SE's. Screening labs and tests as requested with regular follow-up as recommended. Over 40 minutes of exam, counseling, chart review, and complex, high level critical decision making was performed this visit.   Future Appointments  Date Time Provider [place]  06/08/2022  3:00 PM Alycia Rossetti, NP [facility] None  05/30/2023  2:00 PM Alycia Rossetti, NP [facility] None    HPI  47 y.o. female  presents for a complete physical and follow up for has Sleep apnea with hypersomnolence; Depression with anxiety; Hyperlipidemia; Labile hypertension; Obesity (BMI 30.0-34.9); Fatty liver; Uterine fibroid; Diverticulosis of colon; Migraines; and Esophageal dysphagia on their problem list.   She is still going through a separation, stress going through holidays, 2 daughters and a younger son. She is currently well controlled on Wellbutrin and Lexapro. She has hx of depression/anxiety been seeing a therapist since 2020, she is taking the xanax AS needed 1/2-1 tablet 2-3 x a week.  She has had a history of hematuria, had cysto 10+ years ago, her last CT scan in 02/14/2019 showed bladder wall thickening suggestive of cystitis, followed up with urology with no changes.   She had gallstones and fatty liver on the CT, [place] ab[place] 04/2019 showed Cholelithiasis, Nonspecific 9 mm hypoechoic hepatic nodule grossly stable in size since 02/14/2019 and had cholecystectomy 08/01/2019, reports has had intermittent unpredictable episodes of diarrhea, trying to track and adjust diet but interested in med to help this that was mentioned by surgeon. She has follow up appointment  with Dr. Tarri Glenn  She has hx of frequent headaches; reports unilateral with vision changes and may have nausea; typical migraine. She has been on topiramate 50 mg with significant improvement, but recently having near daily HA in afternoon, taking ibuprofen. In the past, still has some and just recently restarted and needs prescription  refilled  Has sleep apnea, has mouth piece with Dr. Toy Cookey, sleep study 2016.    BMI is There is no height or weight on file to calculate BMI., she is working on diet and exercise. She was going to start [facility] but is 1100, she has never tried Phentermine.  [facility] and is [facility].  Wt Readings from Last 3 Encounters:  12/06/21 163 lb 3.2 oz (74 kg)  08/19/21 163 lb (73.9 kg)  05/14/21 179 lb 6.4 oz (81.4 kg)   Has history of palpitations, normal holter 2014.  First MI in father at 35, died at 71 from MI, was alcoholic.  Her blood pressure has been controlled at home, BP is doing well with an increase in the metoprolol XL to 25 mg a day, today their BP is   BP Readings from Last 3 Encounters:  12/06/21 124/78  08/19/21 130/86  05/14/21 118/78    She does not workout, she does some walking. She denies chest pain, shortness of breath, dizziness.    She is not on cholesterol medication and denies myalgias. Her cholesterol is at goal. The cholesterol last visit was:   Lab Results  Component Value Date   CHOL 169 12/06/2021   HDL 62 12/06/2021   LDLCALC 82 12/06/2021   TRIG 150 (H) 12/06/2021   CHOLHDL 2.7 12/06/2021    Last A1C in the office was:  Lab Results  Component Value Date   HGBA1C 5.4 05/14/2021   Patient is on Vitamin D supplement, taking 5000 IU daily    Lab Results  Component Value Date   VD25OH 35 12/06/2021     Lab Results  Component Value Date   [id number] 05/06/2020   She is s/p endometrial ablation and BSO and she has felt better since that time.  Lab Results  Component Value Date   IRON 109 03/19/2018   TIBC 353 03/19/2018   Lab Results  Component Value Date   ALT 43 (H) 12/06/2021      Current Medications:    Current Outpatient Medications (Cardiovascular):    losartan-hydrochlorothiazide (HYZAAR) 50-12.5 MG tablet, TAKE 1 TABLET BY MOUTH EVERY DAY   Current Outpatient Medications (Analgesics):    Rimegepant  Sulfate (NURTEC) 75 MG TBDP, 1 tab at onset of headache, can redose in 1 hour if headache is not resolved no more than 2 in 24 hours   Current Outpatient Medications (Other):    ALPRAZolam (XANAX) 0.25 MG tablet, TAKE 1/2 TO 1 TABLET ONCE OR TWICE DAILY AS NEEDED FOR ANXIETY ATTACKS   buPROPion (WELLBUTRIN XL) 150 MG 24 hr tablet, Take  1 tablet  every Morning  for Mood, Focus & Concentration       (Dx:   f41.8 )                  /     TAKE                          BY  MOUTH   Cholecalciferol (VITAMIN D) 125 MCG (5000 UT) CAPS, Take 5,000 Units by mouth daily.   escitalopram (LEXAPRO) 20 MG tablet, Take  1 tablet  Daily  for Mood  (Dx: f41.8)   famotidine (PEPCID) 40 MG tablet, Take 1 tablet (40 mg total) by mouth at bedtime.   pantoprazole (PROTONIX) 40 MG tablet, TAKE 1 TABLET BY MOUTH TWICE A DAY   phentermine (ADIPEX-P) 37.5 MG tablet, TAKE 0.5 TO 1 TABLET BY MOUTH EVERY MORNING FOR DIETING AND WEIGHT LOSS   topiramate (TOPAMAX) 50 MG tablet, TAKE 1 TABLET BY MOUTH TWICE A DAY   valACYclovir (VALTREX) 500 MG tablet, Take 1 tablet (500 mg total) by mouth 2 (two) times daily. (Patient taking differently: Take 500 mg by mouth 2 (two) times daily as needed (outbreaks). As needed)  Allergies:  Allergies  Allergen Reactions   Codeine Nausea And Vomiting   Azithromycin Nausea And Vomiting   Chlorhexidine Gluconate Rash    CHG   Medical History:  She has Sleep apnea with hypersomnolence; Depression with anxiety; Hyperlipidemia; Labile hypertension; Obesity (BMI 30.0-34.9); Fatty liver; Uterine fibroid; Diverticulosis of colon; Migraines; and Esophageal dysphagia on their problem list.   Health Maintenance:   Immunization History  Administered Date(s) Administered   Influenza-Unspecified 01/12/2015   PPD Test 11/11/2013   Td 05/06/2020   Tdap 04/11/2010   Tetanus: 2022 Pneumovax: n/a Flu vaccine: 2022 at work  Shingrix: discuss age 85 Covid 38: not yet,  considering, no questions   No LMP recorded. Pap: 2020, q3 years Dr. Willis Modena MGM: 02/18/21   Colonoscopy: due age 29  Sleep study 2016  Last vision: 2022, [place] [facility] care, glasses occasionally  Dental: [place], last 2022, goes q22m Patient Care Team: MUnk Pinto MD as PCP - General (Internal Medicine) NRozetta Nunnery MD (Inactive) as Consulting Physician (Otolaryngology) MCheri Fowler MD as Consulting Physician (Obstetrics and Gynecology) [place] H[place]MEduardo Osier, MD as Attending Physician (Urology) CEarlie Server MD as Consulting Physician (Orthopedic Surgery)  Surgical History:  She has a past surgical history that includes Mass excision (Left, 01/14/2016); Dilation and curettage of uterus (age 47; Essure tubal ligation (Bilateral, Jan or Feb 2012   dr meisinger office); Laparoscopic bilateral salpingectomy (Bilateral, 09/01/2017); Endometrial ablation (N/A, 09/01/2017); Wisdom tooth extraction; Cholecystectomy (N/A, 08/01/2019); and Breast cyst excision (Left). Family History:  Herfamily history includes Alcohol abuse in her father; Anxiety disorder in her daughter; Autism in her daughter; Breast cancer in her paternal aunt and paternal aunt; Cancer in her paternal grandmother; Diabetes in her paternal uncle; Heart disease in her father and maternal grandmother; Hyperlipidemia in her father; Hypertension in her father and mother; Stroke in her paternal uncle. Social History:  She reports that she has never smoked. She has never used smokeless tobacco. She reports that she does not currently use alcohol. She reports that she does not use drugs.   Review of Systems: Review of Systems  Constitutional:  Negative for malaise/fatigue and weight loss.  HENT:  Negative for hearing loss and tinnitus.   Eyes:  Negative for blurred vision and double vision.  Respiratory:  Negative for cough, shortness of breath and wheezing.   Cardiovascular:  Negative for chest pain,  palpitations, orthopnea, claudication and leg swelling.  Gastrointestinal:  Positive for diarrhea. Negative for abdominal pain, blood in stool, constipation, heartburn, melena, nausea and vomiting.  Genitourinary: Negative.   Musculoskeletal:  Negative for joint pain and myalgias.  Skin:  Negative for rash.  Neurological:  Positive for  headaches (nearly daily, unilateral with vision changes and intermittent nausea). Negative for dizziness, tingling, sensory change and weakness.  Endo/Heme/Allergies:  Negative for polydipsia.  Psychiatric/Behavioral:  Positive for depression. Negative for hallucinations, memory loss, substance abuse and suicidal ideas. The patient is nervous/anxious. The patient does not have insomnia.   All other systems reviewed and are negative.   Physical Exam: Estimated body mass index is 28.91 kg/m as calculated from the following:   Height as of 12/06/21: '5\' 3"'$  (1.6 m).   Weight as of 12/06/21: 163 lb 3.2 oz (74 kg). There were no vitals taken for this visit. General Appearance: Well nourished, in no apparent distress.  Eyes: PERRLA, EOMs, conjunctiva no swelling or erythema Sinuses: No Frontal/maxillary tenderness  ENT/Mouth: Ext aud canals clear, normal light reflex with TMs without erythema, bulging. Good dentition. No erythema, swelling, or exudate on post pharynx. Tonsils not swollen or erythematous. Hearing normal.  Neck: Supple, thyroid normal. No bruits  Respiratory: Respiratory effort normal, BS equal bilaterally without rales, rhonchi, wheezing or stridor.  Cardio: RRR without murmurs, rubs or gallops. Brisk peripheral pulses without edema.  Chest: symmetric, with normal excursions and percussion.  Breasts: defer to GYN  Abdomen: Soft, BS x 4, non-tender no guarding, rebound, hernias, masses, or organomegaly.  Lymphatics: Non tender without lymphadenopathy.  Genitourinary: defer to GYN Musculoskeletal: Full ROM all peripheral extremities,5/5 strength, and  normal gait.  Skin: Warm, dry without rashes, lesions, ecchymosis. Neuro: Cranial nerves intact, reflexes equal bilaterally. Normal muscle tone, no cerebellar symptoms. Sensation intact.  Psych: Awake and oriented X 3, normal affect, Insight and Judgment appropriate.   EKG: NSR no ST changes.  Alycia Rossetti 9:39 AM [facility]

## 2022-06-08 ENCOUNTER — Encounter: Payer: Self-pay | Admitting: Nurse Practitioner

## 2022-06-08 ENCOUNTER — Encounter: Payer: 59 | Admitting: Nurse Practitioner

## 2022-06-08 DIAGNOSIS — G4710 Hypersomnia, unspecified: Secondary | ICD-10-CM

## 2022-06-08 DIAGNOSIS — I10 Essential (primary) hypertension: Secondary | ICD-10-CM

## 2022-06-08 DIAGNOSIS — E559 Vitamin D deficiency, unspecified: Secondary | ICD-10-CM

## 2022-06-08 DIAGNOSIS — Z79899 Other long term (current) drug therapy: Secondary | ICD-10-CM

## 2022-06-08 DIAGNOSIS — F418 Other specified anxiety disorders: Secondary | ICD-10-CM

## 2022-06-08 DIAGNOSIS — K760 Fatty (change of) liver, not elsewhere classified: Secondary | ICD-10-CM

## 2022-06-08 DIAGNOSIS — Z1329 Encounter for screening for other suspected endocrine disorder: Secondary | ICD-10-CM

## 2022-06-08 DIAGNOSIS — Z0001 Encounter for general adult medical examination with abnormal findings: Secondary | ICD-10-CM

## 2022-06-08 DIAGNOSIS — E782 Mixed hyperlipidemia: Secondary | ICD-10-CM

## 2022-06-08 DIAGNOSIS — G43909 Migraine, unspecified, not intractable, without status migrainosus: Secondary | ICD-10-CM

## 2022-06-08 DIAGNOSIS — Z136 Encounter for screening for cardiovascular disorders: Secondary | ICD-10-CM

## 2022-06-08 DIAGNOSIS — R7309 Other abnormal glucose: Secondary | ICD-10-CM

## 2022-06-08 DIAGNOSIS — Z1389 Encounter for screening for other disorder: Secondary | ICD-10-CM

## 2022-06-08 DIAGNOSIS — E669 Obesity, unspecified: Secondary | ICD-10-CM

## 2022-06-09 ENCOUNTER — Other Ambulatory Visit: Payer: Self-pay | Admitting: Nurse Practitioner

## 2022-06-09 DIAGNOSIS — E669 Obesity, unspecified: Secondary | ICD-10-CM

## 2022-07-11 ENCOUNTER — Encounter: Payer: 59 | Admitting: Nurse Practitioner

## 2022-07-20 ENCOUNTER — Ambulatory Visit (INDEPENDENT_AMBULATORY_CARE_PROVIDER_SITE_OTHER): Payer: 59 | Admitting: Nurse Practitioner

## 2022-07-20 ENCOUNTER — Encounter: Payer: Self-pay | Admitting: Nurse Practitioner

## 2022-07-20 VITALS — BP 138/88 | HR 98 | Temp 97.9°F | Ht 63.0 in | Wt 173.4 lb

## 2022-07-20 DIAGNOSIS — Z136 Encounter for screening for cardiovascular disorders: Secondary | ICD-10-CM | POA: Diagnosis not present

## 2022-07-20 DIAGNOSIS — K591 Functional diarrhea: Secondary | ICD-10-CM

## 2022-07-20 DIAGNOSIS — Z131 Encounter for screening for diabetes mellitus: Secondary | ICD-10-CM

## 2022-07-20 DIAGNOSIS — R0989 Other specified symptoms and signs involving the circulatory and respiratory systems: Secondary | ICD-10-CM

## 2022-07-20 DIAGNOSIS — G43909 Migraine, unspecified, not intractable, without status migrainosus: Secondary | ICD-10-CM

## 2022-07-20 DIAGNOSIS — Z0000 Encounter for general adult medical examination without abnormal findings: Secondary | ICD-10-CM

## 2022-07-20 DIAGNOSIS — E66811 Obesity, class 1: Secondary | ICD-10-CM

## 2022-07-20 DIAGNOSIS — G4710 Hypersomnia, unspecified: Secondary | ICD-10-CM

## 2022-07-20 DIAGNOSIS — E782 Mixed hyperlipidemia: Secondary | ICD-10-CM

## 2022-07-20 DIAGNOSIS — E559 Vitamin D deficiency, unspecified: Secondary | ICD-10-CM

## 2022-07-20 DIAGNOSIS — F418 Other specified anxiety disorders: Secondary | ICD-10-CM

## 2022-07-20 DIAGNOSIS — K760 Fatty (change of) liver, not elsewhere classified: Secondary | ICD-10-CM

## 2022-07-20 DIAGNOSIS — E669 Obesity, unspecified: Secondary | ICD-10-CM

## 2022-07-20 DIAGNOSIS — Z1389 Encounter for screening for other disorder: Secondary | ICD-10-CM

## 2022-07-20 DIAGNOSIS — Z0001 Encounter for general adult medical examination with abnormal findings: Secondary | ICD-10-CM

## 2022-07-20 DIAGNOSIS — Z79899 Other long term (current) drug therapy: Secondary | ICD-10-CM

## 2022-07-20 DIAGNOSIS — Z1329 Encounter for screening for other suspected endocrine disorder: Secondary | ICD-10-CM

## 2022-07-20 DIAGNOSIS — R599 Enlarged lymph nodes, unspecified: Secondary | ICD-10-CM

## 2022-07-20 NOTE — Patient Instructions (Addendum)
Probiotic Lactobacillus or Acidophilus   Hidradenitis Suppurativa Hidradenitis suppurativa is a long-term (chronic) skin disease. It is similar to a severe form of acne, but it affects areas of the body where acne would be unusual, especially areas of the body where skin rubs against skin and becomes moist. These include: Underarms. Groin. Genital area. Buttocks. Upper thighs. Breasts. Hidradenitis suppurativa may start out as small lumps or pimples caused by blocked skin pores, sweat glands, or hair follicles. Pimples may develop into deep sores that break open (rupture) and drain pus. Over time, affected areas of skin may thicken and become scarred. This condition is rare and does not spread from person to person (non-contagious). What are the causes? The exact cause of this condition is not known. It may be related to: Female and female hormones. An overactive disease-fighting system (immune system). The immune system may over-react to blocked hair follicles or sweat glands and cause swelling and pus-filled sores. What increases the risk? You are more likely to develop this condition if you: Are female. Are 70-53 years old. Have a family history of hidradenitis suppurativa. Have a personal history of acne. Are overweight. Smoke. Take the medicine lithium. What are the signs or symptoms? The first symptoms are usually painful bumps in the skin, similar to pimples. The condition may get worse over time (progress), or it may only cause mild symptoms. If the disease progresses, symptoms may include: Skin bumps getting bigger and growing deeper into the skin. Bumps rupturing and draining pus. Itchy, infected skin. Skin getting thicker and scarred. Tunnels under the skin (fistulas) where pus drains from a bump. Pain during daily activities, such as pain during walking if your groin area is affected. Emotional problems, such as stress or depression. This condition may affect your appearance  and your ability or willingness to wear certain clothes or do certain activities. How is this diagnosed? This condition is diagnosed by a health care provider who specializes in skin conditions (dermatologist). You may be diagnosed based on: Your symptoms and medical history. A physical exam. Testing a pus sample for infection. Blood tests. How is this treated? Your treatment will depend on how severe your symptoms are. The same treatment will not work for everybody with this condition. You may need to try several treatments to find what works best for you. Treatment may include: Cleaning and bandaging (dressing) your wounds as needed. Lifestyle changes, such as new skin care routines. Taking medicines, such as: Antibiotics. Acne medicines. Medicines to reduce the activity of the immune system. A diabetes medicine (metformin). Birth control pills, for women. Steroids to reduce swelling and pain. Working with a mental health care provider, if you experience emotional distress due to this condition. If you have severe symptoms that do not get better with medicine, you may need surgery. Surgery may involve: Using a laser to clear the skin and remove hair follicles. Opening and draining deep sores. Removing the areas of skin that are diseased and scarred. Follow these instructions at home: Medicines  Take over-the-counter and prescription medicines only as told by your health care provider. If you were prescribed antibiotics, take them as told by your health care provider. Do not stop using the antibiotic even if your condition improves. Skin care If you have open wounds, cover them with a clean dressing as told by your health care provider. Keep wounds clean by washing them gently with soap and water when you bathe. Do not shave the areas where you get hidradenitis suppurativa.  Wear loose-fitting clothes. Try to avoid getting overheated or sweaty. If you get sweaty or wet, change into  clean, dry clothes as soon as you can. To help relieve pain and itchiness, cover sore areas with a warm, clean washcloth (warm compress) for 5-10 minutes as often as needed. Your healthcare provider may recommend an antiperspirant deodorant that may be gentle on your skin. A daily antiseptic wash to cleanse affected areas may be suggested by your healthcare provider. General instructions Learn as much as you can about your disease so that you have an active role in your treatment. Work closely with your health care provider to find treatments that work for you. If you are overweight, work with your health care provider to lose weight as recommended. Do not use any products that contain nicotine or tobacco. These products include cigarettes, chewing tobacco, and vaping devices, such as e-cigarettes. If you need help quitting, ask your health care provider. If you struggle with living with this condition, talk with your health care provider or work with a mental health care provider as recommended. Keep all follow-up visits. Where to find more information Hidradenitis Suppurativa Foundation, Inc.: www.hs-foundation.org American Academy of Dermatology: InfoExam.si Contact a health care provider if: You have a flare-up of hidradenitis suppurativa. You have a fever or chills. You have trouble controlling your symptoms at home. You have trouble doing your daily activities because of your symptoms. You have trouble dealing with emotional problems related to your condition. Summary Hidradenitis suppurativa is a long-term (chronic) skin disease. It is similar to a severe form of acne, but it affects areas of the body where acne would be unusual. The first symptoms are usually painful bumps in the skin, similar to pimples. The condition may only cause mild symptoms, or it may get worse over time (progress). If you have open wounds, cover them with a clean dressing as told by your health care provider.  Keep wounds clean by washing them gently with soap and water when you bathe. Besides skin care, treatment may include medicines, laser treatment, and surgery. This information is not intended to replace advice given to you by your health care provider. Make sure you discuss any questions you have with your health care provider. Document Revised: 05/19/2021 Document Reviewed: 05/19/2021 Elsevier Patient Education  2023 [employer].

## 2022-07-20 NOTE — Progress Notes (Signed)
Complete Physical  Assessment and Plan:  Encounter for general adult medical examination with abnormal findings Due annually Health maintenance reviewed Healthy lifestyle goals set  Fatty liver Check labs, avoid tylenol, alcohol, weight loss advised.   Mixed hyperlipidemia Discussed lifestyle modifications. Recommended diet heavy in fruits and veggies, omega 3's. Decrease consumption of animal meats, cheeses, and dairy products. Remain active and exercise as tolerated. Continue to monitor. Check lipids/TSH  Labile hypertension Continue Losartan Potassium-HCTZ Discussed DASH (Dietary Approaches to Stop Hypertension) DASH diet is lower in sodium than a typical American diet. Cut back on foods that are high in saturated fat, cholesterol, and trans fats. Eat more whole-grain foods, fish, poultry, and nuts Remain active and exercise as tolerated daily.  Monitor BP at home-Call if greater than 130/80.  Check CMP/CBC  Sleep apnea with hypersomnolence Uncontrolled Followed dentist Dr. Toni Arthurs - last sleep study 2016.  Has oral device no longer effective. Refer for new sleep study.  Depression with anxiety Continue medications Reviewed relaxation techniques.  Sleep hygiene. Recommended mindfulness meditation and exercise.   Encouraged personality growth wand development through coping techniques and problem-solving skills. Limit/Decrease/Monitor drug/alcohol intake.     Obesity (BMI 30.0-34.9) Continue Topamax 50 mg and Phentermine 37.5 mg daily Confirm weight loss coverage through insurance - possible switch to [facility]. Discussed appropriate BMI Goal of losing 1 lb per month. Diet modification. Physical activity. Encouraged/praised to build confidence.  Medication management All medications discussed and reviewed in full. All questions and concerns regarding medications addressed.    Vitamin D deficiency Continue supplement for goal of 60-100 Monitor Vitamin D  levels  Migraine/ daily headache Nurtec as needed Topamax 50 mg daily Stay well hydurated Discussed benefit of magnesium supplement Continue to monitor  Screening for ischemic heart disease -EKG  Screening for diabetes mellitus -     Hemoglobin A1c ([facility])  Screening for thyroid disorder - TSH  Screening for hematuria or proteinuria - Routine UA with reflex microscopic - Microablumin/creatinne urine ratio  Posterior cervical lymph node edema (left side) Complete Ultrasound to assess for lymphadenopathy Discussed  HS versus lipomas Continue to monitor  Functional diarrhea Discussed post cholecystectomy diarrhea Suggest daily probiotic OTC such as acidophilus or lactobacillus Plan for Cologuard 02/2023 Stay well hydrated  Orders Placed This Encounter  Procedures   US THYROID    Standing Status:   Future    Standing Expiration Date:   07/20/2023    Order Specific Question:   Reason for Exam (SYMPTOM  OR DIAGNOSIS REQUIRED)    Answer:   Posterior cervical edema (left side)    Order Specific Question:   Preferred imaging location?    Answer:   [facility]   CBC with Differential/Platelet   COMPLETE METABOLIC PANEL WITH GFR   Lipid panel   TSH   Hemoglobin A1c   Insulin, random   VITAMIN D 25 Hydroxy (Vit-D Deficiency, Fractures)   Urinalysis, Routine w reflex microscopic   Microalbumin / creatinine urine ratio   Ambulatory referral to Sleep Studies    Referral Priority:   Routine    Referral Type:   Consultation    Referral Reason:   Specialty Services Required    Number of Visits Requested:   1   EKG 12-Lead    Notify office for further evaluation and treatment, questions or concerns if any reported s/s fail to improve.   The patient was advised to call back or seek an in-person evaluation if any symptoms worsen or if the condition  fails to improve as anticipated.   Further disposition pending results of labs. Discussed med's effects and SE's.    I  discussed the assessment and treatment plan with the patient. The patient was provided an opportunity to ask questions and all were answered. The patient agreed with the plan and demonstrated an understanding of the instructions.  Discussed med's effects and SE's. Screening labs and tests as requested with regular follow-up as recommended.  I provided 40 minutes of face-to-face time during this encounter including counseling, chart review, and critical decision making was preformed.  Today's Plan of Care is based on a patient-centered health care approach known as shared decision making - the decisions, tests and treatments allow for patient preferences and values to be balanced with clinical evidence.      Future Appointments  Date Time Provider Department Center  10/27/2022  9:30 AM Adela Glimpse, NP [facility] None  07/20/2023 10:00 AM Adela Glimpse, NP [facility] None    HPI  47 y.o. female  presents for a complete physical and follow up for has Sleep apnea with hypersomnolence; Depression with anxiety; Hyperlipidemia; Labile hypertension; Obesity (BMI 30.0-34.9); Fatty liver; Uterine fibroid; Diverticulosis of colon; Migraines; and Esophageal dysphagia on their problem list.   She is concerned for a nodule on the pack of her neck that has been present for approximately 3 years.  She does not feel as though it has increased in size.  Does not notice pain, however, concern for continues presence of nodule.  No hx of mono.  She does note another nodule located on left anterior thigh, no change in size for about the same amount of size.  Both nodules are moveable.  Non painful.  States that she had a incident 3 weeks ago where the nodules were present underneath the left arm, but went away.  These nodules were painful.    She has hx of depression/anxiety and continues to see a therapist since 2020, she is taking the xanax AS needed 1/2-1 tablet 2-3 x a week.  She also takes Wellbutrin,  Lexapro and Topamax. Feels mood is currently stable.   She has had a history of hematuria, had cysto 10+ years ago, her last CT scan in 02/14/2019 showed bladder wall thickening suggestive of cystitis, followed up with urology with no changes.   She had gallstones and fatty liver on the CT, [place] abd 04/2019 showed Cholelithiasis, Nonspecific 9 mm hypoechoic hepatic nodule grossly stable in size since 02/14/2019 and had cholecystectomy 08/01/2019, reports has had intermittent unpredictable episodes of diarrhea. Does follow with GI.  Does not take a daily probiotic.   She has hx of frequent headaches; reports unilateral with vision changes and may have nausea; typical migraine. She has been on topiramate 50 mg with significant improvement, but recently having near daily HA in afternoon, taking ibuprofen. Also has Topamax.    Has sleep apnea, has mouth piece with Dr. Toni Arthurs, sleep study 2016.  She feels as though this is no longer effective. She is waking up  more throughout the night.  Family is telling her that she is snoring loudly.      BMI is Body mass index is 30.72 kg/m., she is working on diet and exercise. She was going to start [facility] but is 1100, Currently on phentermine and Topamax but no longer worokingShe has joined [facility] and is doing more exercises.  Wt Readings from Last 3 Encounters:  07/20/22 173 lb 6.4 oz (78.7 kg)  12/06/21 163 lb 3.2  oz (74 kg)  08/19/21 163 lb (73.9 kg)   Has history of palpitations, normal holter 2014.  First MI in father at 1538, died at 3062 from MI, was alcoholic.  Her blood pressure has been controlled at home, BP is doing well with Losartan Potassium HCTZ daily, today their BP is BP: 138/88 BP Readings from Last 3 Encounters:  07/20/22 138/88  12/06/21 124/78  08/19/21 130/86    She does not workout, she does some walking. She denies chest pain, shortness of breath, dizziness.    She is not on cholesterol medication and denies myalgias. Her cholesterol is  at goal. The cholesterol last visit was:   Lab Results  Component Value Date   CHOL 169 12/06/2021   HDL 62 12/06/2021   LDLCALC 82 12/06/2021   TRIG 150 (H) 12/06/2021   CHOLHDL 2.7 12/06/2021    Last A1C in the office was:  Lab Results  Component Value Date   HGBA1C 5.4 05/14/2021   Patient is on Vitamin D supplement, taking 5000 IU daily    Lab Results  Component Value Date   VD25OH 35 12/06/2021     Lab Results  Component Value Date   VITAMINB12 343 05/06/2020   She is s/p endometrial ablation and BSO and she has felt better since that time.  Lab Results  Component Value Date   IRON 109 03/19/2018   TIBC 353 03/19/2018   Lab Results  Component Value Date   ALT 43 (H) 12/06/2021      Current Medications:    Current Outpatient Medications (Cardiovascular):    losartan-hydrochlorothiazide (HYZAAR) 50-12.5 MG tablet, TAKE 1 TABLET BY MOUTH EVERY DAY   Current Outpatient Medications (Analgesics):    Rimegepant Sulfate (NURTEC) 75 MG TBDP, 1 tab at onset of headache, can redose in 1 hour if headache is not resolved no more than 2 in 24 hours   Current Outpatient Medications (Other):    ALPRAZolam (XANAX) 0.25 MG tablet, TAKE 1/2 TO 1 TABLET ONCE OR TWICE DAILY AS NEEDED FOR ANXIETY ATTACKS   buPROPion (WELLBUTRIN XL) 150 MG 24 hr tablet, Take  1 tablet  every Morning  for Mood, Focus & Concentration       (Dx:   f41.8 )                  /     TAKE                          BY                          MOUTH   Cholecalciferol (VITAMIN D) 125 MCG (5000 UT) CAPS, Take 5,000 Units by mouth daily.   escitalopram (LEXAPRO) 20 MG tablet, Take  1 tablet  Daily  for Mood  (Dx: f41.8)   famotidine (PEPCID) 40 MG tablet, Take 1 tablet (40 mg total) by mouth at bedtime.   pantoprazole (PROTONIX) 40 MG tablet, TAKE 1 TABLET BY MOUTH TWICE A DAY   phentermine (ADIPEX-P) 37.5 MG tablet, TAKE 0.5 TO 1 TABLET BY MOUTH EVERY MORNING FOR DIETING AND WEIGHT LOSS   topiramate (TOPAMAX)  50 MG tablet, TAKE 1 TABLET BY MOUTH TWICE A DAY   valACYclovir (VALTREX) 500 MG tablet, Take 1 tablet (500 mg total) by mouth 2 (two) times daily. (Patient taking differently: Take 500 mg by mouth 2 (two) times daily as needed (outbreaks). As  needed)  Allergies:  Allergies  Allergen Reactions   Codeine Nausea And Vomiting   Azithromycin Nausea And Vomiting   Chlorhexidine Gluconate Rash    CHG   Medical History:  She has Sleep apnea with hypersomnolence; Depression with anxiety; Hyperlipidemia; Labile hypertension; Obesity (BMI 30.0-34.9); Fatty liver; Uterine fibroid; Diverticulosis of colon; Migraines; and Esophageal dysphagia on their problem list.   Health Maintenance:   Immunization History  Administered Date(s) Administered   Influenza-Unspecified 01/12/2015   PPD Test 11/11/2013   Td 05/06/2020   Tdap 04/11/2010   Tetanus: 2022 Pneumovax: n/a Flu vaccine: 2023 at work  Shingrix: discuss age 34 Covid 42: not yet, considering, no questions   No LMP recorded. 06/10/2022 Regular Pap: 2020, q3 years Dr. Jackelyn Knife MGM: 02/18/22   Colonoscopy: due age 64 - plans for cologuard this year 02/2023 Sleep study 2016  Last vision: 2023, [place] [facility], glasses occasionally  Dental: [place], last 2023, goes q85m  Patient Care Team: Lucky Cowboy, MD as PCP - General (Internal Medicine) Drema Halon, MD (Inactive) as Consulting Physician (Otolaryngology) Lavina Hamman, MD as Consulting Physician (Obstetrics and Gynecology) [place], [place] Lowella Bandy., MD as Attending Physician (Urology) Frederico Hamman, MD as Consulting Physician (Orthopedic Surgery)  Surgical History:  She has a past surgical history that includes Mass excision (Left, 01/14/2016); Dilation and curettage of uterus (age 12); Essure tubal ligation (Bilateral, Jan or Feb 2012   dr meisinger office); Laparoscopic bilateral salpingectomy (Bilateral, 09/01/2017); Endometrial ablation (N/A, 09/01/2017); Wisdom  tooth extraction; Cholecystectomy (N/A, 08/01/2019); and Breast cyst excision (Left). Family History:  Herfamily history includes Alcohol abuse in her father; Anxiety disorder in her daughter; Autism in her daughter; Breast cancer in her paternal aunt and paternal aunt; Cancer in her paternal grandmother; Diabetes in her paternal uncle; Heart disease in her father and maternal grandmother; Hyperlipidemia in her father; Hypertension in her father and mother; Stroke in her paternal uncle. Social History:  She reports that she has never smoked. She has never used smokeless tobacco. She reports that she does not currently use alcohol. She reports that she does not use drugs.   Review of Systems: Review of Systems  Constitutional:  Negative for malaise/fatigue and weight loss.  HENT:  Negative for hearing loss and tinnitus.   Eyes:  Negative for blurred vision and double vision.  Respiratory:  Negative for cough, shortness of breath and wheezing.   Cardiovascular:  Negative for chest pain, palpitations, orthopnea, claudication and leg swelling.  Gastrointestinal:  Positive for diarrhea. Negative for abdominal pain, blood in stool, constipation, heartburn, melena, nausea and vomiting.  Genitourinary: Negative.   Musculoskeletal:  Negative for joint pain and myalgias.  Skin:  Negative for rash.  Neurological:  Positive for headaches (nearly daily, unilateral with vision changes and intermittent nausea). Negative for dizziness, tingling, sensory change and weakness.  Endo/Heme/Allergies:  Negative for polydipsia.  Psychiatric/Behavioral:  Positive for depression. Negative for hallucinations, memory loss, substance abuse and suicidal ideas. The patient is nervous/anxious. The patient does not have insomnia.   All other systems reviewed and are negative.   Physical Exam: Estimated body mass index is 30.72 kg/m as calculated from the following:   Height as of this encounter: 5\' 3"  (1.6 m).   Weight  as of this encounter: 173 lb 6.4 oz (78.7 kg). BP 138/88   Pulse 98   Temp 97.9 F (36.6 C)   Ht 5\' 3"  (1.6 m)   Wt 173 lb 6.4 oz (78.7 kg)   SpO2  99%   BMI 30.72 kg/m   General Appearance: Well nourished, in no apparent distress.  Eyes: PERRLA, EOMs, conjunctiva no swelling or erythema Sinuses: No Frontal/maxillary tenderness  ENT/Mouth: Ext aud canals clear, normal light reflex with TMs without erythema, bulging. Good dentition. No erythema, swelling, or exudate on post pharynx. Tonsils not swollen or erythematous. Hearing normal.  Neck: Supple, thyroid normal. No bruits. Left posterior cervical area with small round approximately 0.5 mm moveable nodule.  Respiratory: Respiratory effort normal, BS equal bilaterally without rales, rhonchi, wheezing or stridor.  Cardio: RRR without murmurs, rubs or gallops. Brisk peripheral pulses without edema.  Chest: symmetric, with normal excursions and percussion.  Breasts: defer to GYN  Abdomen: Soft, BS x 4, non-tender no guarding, rebound, hernias, masses, or organomegaly.  Lymphatics: Non tender with left posterior cervical area with small round approximately 0.5 mm moveable nodule.  Genitourinary: defer to GYN Musculoskeletal: Full ROM all peripheral extremities,5/5 strength, and normal gait.  Skin: Warm, dry without rashes, lesions, ecchymosis. Left anterior thigh with approximately 0.5 mm round moveable nodule. Non painful  Neuro: Cranial nerves intact, reflexes equal bilaterally. Normal muscle tone, no cerebellar symptoms. Sensation intact.  Psych: Awake and oriented X 3, normal affect, Insight and Judgment appropriate.   EKG: NSR no ST changes.  Aveion Nguyen 1:28 PM [facility]

## 2022-07-21 LAB — LIPID PANEL
Cholesterol: 171 mg/dL (ref <200)
HDL: 56 mg/dL (ref >=50)
LDL Cholesterol (Calc): 87 mg/dL (calc)
Non-HDL Cholesterol (Calc): 115 mg/dL (calc) (ref <130)
Total CHOL/HDL Ratio: 3.1 (calc) (ref <5.0)
Triglycerides: 180 mg/dL — ABNORMAL HIGH (ref <150)

## 2022-07-21 LAB — URINALYSIS, ROUTINE W REFLEX MICROSCOPIC
Bilirubin Urine: NEGATIVE
Glucose, UA: NEGATIVE
Hyaline Cast: NONE SEEN /LPF
Ketones, ur: NEGATIVE
Leukocytes,Ua: NEGATIVE
Nitrite: NEGATIVE
Protein, ur: NEGATIVE
RBC / HPF: NONE SEEN /HPF (ref 0–2)
Specific Gravity, Urine: 1.013 (ref 1.001–1.035)
WBC, UA: NONE SEEN /HPF (ref 0–5)
pH: 6 (ref 5.0–8.0)

## 2022-07-21 LAB — CBC WITH DIFFERENTIAL/PLATELET
Absolute Monocytes: 462 cells/uL (ref 200–950)
Basophils Absolute: 43 cells/uL (ref 0–200)
Basophils Relative: 0.6 %
Eosinophils Absolute: 220 cells/uL (ref 15–500)
Eosinophils Relative: 3.1 %
HCT: 43.3 % (ref 35.0–45.0)
Hemoglobin: 14.9 g/dL (ref 11.7–15.5)
Lymphs Abs: 2364 cells/uL (ref 850–3900)
MCH: 32.3 pg (ref 27.0–33.0)
MCHC: 34.4 g/dL (ref 32.0–36.0)
MCV: 93.9 fL (ref 80.0–100.0)
MPV: 10 fL (ref 7.5–12.5)
Monocytes Relative: 6.5 %
Neutro Abs: 4012 cells/uL (ref 1500–7800)
Neutrophils Relative %: 56.5 %
Platelets: 283 10*3/uL (ref 140–400)
RBC: 4.61 10*6/uL (ref 3.80–5.10)
RDW: 12.7 % (ref 11.0–15.0)
Total Lymphocyte: 33.3 %
WBC: 7.1 10*3/uL (ref 3.8–10.8)

## 2022-07-21 LAB — COMPLETE METABOLIC PANEL WITH GFR
AG Ratio: 1.3 (calc) (ref 1.0–2.5)
ALT: 36 U/L — ABNORMAL HIGH (ref 6–29)
AST: 20 U/L (ref 10–30)
Albumin: 4.4 g/dL (ref 3.6–5.1)
Alkaline phosphatase (APISO): 57 U/L (ref 31–125)
BUN: 10 mg/dL (ref 7–25)
CO2: 30 mmol/L (ref 20–32)
Calcium: 9.4 mg/dL (ref 8.6–10.2)
Chloride: 100 mmol/L (ref 98–110)
Creat: 0.52 mg/dL (ref 0.50–0.99)
Globulin: 3.4 g/dL (calc) (ref 1.9–3.7)
Glucose, Bld: 70 mg/dL (ref 65–99)
Potassium: 3.5 mmol/L (ref 3.5–5.3)
Sodium: 140 mmol/L (ref 135–146)
Total Bilirubin: 0.4 mg/dL (ref 0.2–1.2)
Total Protein: 7.8 g/dL (ref 6.1–8.1)
eGFR: 117 mL/min/{1.73_m2} (ref >=60)

## 2022-07-21 LAB — VITAMIN D 25 HYDROXY (VIT D DEFICIENCY, FRACTURES): Vit D, 25-Hydroxy: 40 ng/mL (ref 30–100)

## 2022-07-21 LAB — MICROALBUMIN / CREATININE URINE RATIO
Creatinine, Urine: 43 mg/dL (ref 20–275)
Microalb Creat Ratio: 14 mg/g creat (ref <30)
Microalb, Ur: 0.6 mg/dL

## 2022-07-21 LAB — HEMOGLOBIN A1C
Hgb A1c MFr Bld: 5.5 % of total Hgb (ref <5.7)
Mean Plasma Glucose: 111 mg/dL
eAG (mmol/L): 6.2 mmol/L

## 2022-07-21 LAB — INSULIN, RANDOM: Insulin: 32.1 u[IU]/mL — ABNORMAL HIGH

## 2022-07-21 LAB — MICROSCOPIC MESSAGE

## 2022-07-21 LAB — TSH: TSH: 1.44 mIU/L

## 2022-07-24 ENCOUNTER — Other Ambulatory Visit: Payer: Self-pay | Admitting: Nurse Practitioner

## 2022-07-24 DIAGNOSIS — E669 Obesity, unspecified: Secondary | ICD-10-CM

## 2022-07-24 DIAGNOSIS — E66811 Obesity, class 1: Secondary | ICD-10-CM

## 2022-07-30 ENCOUNTER — Encounter: Payer: Self-pay | Admitting: Nurse Practitioner

## 2022-07-30 DIAGNOSIS — E669 Obesity, unspecified: Secondary | ICD-10-CM

## 2022-08-01 ENCOUNTER — Telehealth: Payer: Self-pay

## 2022-08-01 DIAGNOSIS — E669 Obesity, unspecified: Secondary | ICD-10-CM

## 2022-08-01 MED ORDER — WEGOVY 0.25 MG/0.5ML ~~LOC~~ SOAJ
0.2500 mg | SUBCUTANEOUS | 2 refills | Status: DC
Start: 2022-08-01 — End: 2022-10-04

## 2022-08-01 MED ORDER — WEGOVY 0.25 MG/0.5ML ~~LOC~~ SOAJ
0.2500 mg | SUBCUTANEOUS | 2 refills | Status: DC
Start: 2022-08-01 — End: 2022-08-01

## 2022-08-01 NOTE — Telephone Encounter (Signed)
PA completed and approved for [facility]

## 2022-08-12 ENCOUNTER — Other Ambulatory Visit: Payer: Self-pay | Admitting: Nurse Practitioner

## 2022-08-12 ENCOUNTER — Ambulatory Visit
Admission: RE | Admit: 2022-08-12 | Discharge: 2022-08-12 | Disposition: A | Discharge status: Home / Self Care | Payer: 59 | Source: Ambulatory Visit | Attending: Nurse Practitioner | Admitting: Nurse Practitioner

## 2022-08-12 DIAGNOSIS — R599 Enlarged lymph nodes, unspecified: Secondary | ICD-10-CM

## 2022-08-12 DIAGNOSIS — G4730 Sleep apnea, unspecified: Secondary | ICD-10-CM

## 2022-08-12 DIAGNOSIS — R0989 Other specified symptoms and signs involving the circulatory and respiratory systems: Secondary | ICD-10-CM

## 2022-08-12 DIAGNOSIS — E559 Vitamin D deficiency, unspecified: Secondary | ICD-10-CM

## 2022-08-12 DIAGNOSIS — E669 Obesity, unspecified: Secondary | ICD-10-CM

## 2022-08-12 DIAGNOSIS — K760 Fatty (change of) liver, not elsewhere classified: Secondary | ICD-10-CM

## 2022-08-12 DIAGNOSIS — G43909 Migraine, unspecified, not intractable, without status migrainosus: Secondary | ICD-10-CM

## 2022-08-12 DIAGNOSIS — E782 Mixed hyperlipidemia: Secondary | ICD-10-CM

## 2022-08-12 DIAGNOSIS — Z1329 Encounter for screening for other suspected endocrine disorder: Secondary | ICD-10-CM

## 2022-08-12 DIAGNOSIS — Z1389 Encounter for screening for other disorder: Secondary | ICD-10-CM

## 2022-08-12 DIAGNOSIS — K591 Functional diarrhea: Secondary | ICD-10-CM

## 2022-08-12 DIAGNOSIS — Z131 Encounter for screening for diabetes mellitus: Secondary | ICD-10-CM

## 2022-08-12 DIAGNOSIS — Z0001 Encounter for general adult medical examination with abnormal findings: Secondary | ICD-10-CM

## 2022-08-12 DIAGNOSIS — Z136 Encounter for screening for cardiovascular disorders: Secondary | ICD-10-CM

## 2022-08-12 DIAGNOSIS — Z79899 Other long term (current) drug therapy: Secondary | ICD-10-CM

## 2022-08-12 DIAGNOSIS — F418 Other specified anxiety disorders: Secondary | ICD-10-CM

## 2022-08-17 ENCOUNTER — Encounter: Payer: Self-pay | Admitting: Nurse Practitioner

## 2022-08-24 ENCOUNTER — Ambulatory Visit (INDEPENDENT_AMBULATORY_CARE_PROVIDER_SITE_OTHER): Payer: 59 | Admitting: Neurology

## 2022-08-24 ENCOUNTER — Encounter: Payer: Self-pay | Admitting: Neurology

## 2022-08-24 VITALS — BP 142/92 | HR 85 | Ht 63.0 in | Wt 172.4 lb

## 2022-08-24 DIAGNOSIS — G478 Other sleep disorders: Secondary | ICD-10-CM

## 2022-08-24 DIAGNOSIS — R0683 Snoring: Secondary | ICD-10-CM | POA: Diagnosis not present

## 2022-08-24 DIAGNOSIS — R519 Headache, unspecified: Secondary | ICD-10-CM | POA: Diagnosis not present

## 2022-08-24 DIAGNOSIS — F519 Sleep disorder not due to a substance or known physiological condition, unspecified: Secondary | ICD-10-CM | POA: Insufficient documentation

## 2022-08-24 NOTE — Patient Instructions (Signed)
Screening for Sleep Apnea  Sleep apnea is a condition in which breathing pauses or becomes shallow during sleep. Sleep apnea screening is a test to determine if you are at risk for sleep apnea. The test includes a series of questions. It will only takes a few minutes. Your health care provider may ask you to have this test in preparation for surgery or as part of a physical exam. What are the symptoms of sleep apnea? Common symptoms of sleep apnea include: Snoring. Waking up often at night. Daytime sleepiness. Pauses in breathing. Choking or gasping during sleep. Irritability. Forgetfulness. Trouble thinking clearly. Depression. Personality changes. Most people with sleep apnea do not know that they have it. What are the advantages of sleep apnea screening? Getting screened for sleep apnea can help: Ensure your safety. It is important for your health care providers to know whether or not you have sleep apnea, especially if you are having surgery or have other long-term (chronic) health conditions. Improve your health and allow you to get a better night's rest. Restful sleep can help you: Have more energy. Lose weight. Improve high blood pressure. Improve diabetes management. Prevent stroke. Prevent car accidents. What happens during the screening? Screening usually includes being asked a list of questions about your sleep quality. Some questions you may be asked include: Do you snore? Is your sleep restless? Do you have daytime sleepiness? Has a partner or spouse told you that you stop breathing during sleep? Have you had trouble concentrating or memory loss? What is your age? What is your neck circumference? To measure your neck, keep your back straight and gently wrap the tape measure around your neck. Put the tape measure at the middle of your neck, between your chin and collarbone. What is your sex assigned at birth? Do you have or are you being treated for high blood  pressure? If your screening test is positive, you are at risk for the condition. Further testing may be needed to confirm a diagnosis of sleep apnea. Where to find more information You can find screening tools online or at your health care clinic. For more information about sleep apnea screening and healthy sleep, visit these websites: Centers for Disease Control and Prevention: FootballExhibition.com.br American Sleep Apnea Association: www.sleepapnea.org Contact a health care provider if: You think that you may have sleep apnea. Summary Sleep apnea screening can help determine if you are at risk for sleep apnea. It is important for your health care providers to know whether or not you have sleep apnea, especially if you are having surgery or have other chronic health conditions. You may be asked to take a screening test for sleep apnea in preparation for surgery or as part of a physical exam. This information is not intended to replace advice given to you by your health care provider. Make sure you discuss any questions you have with your health care provider. Document Revised: 03/06/2020 Document Reviewed: 03/06/2020 Elsevier Patient Education  2023 Elsevier Inc. Living With Sleep Apnea Sleep apnea is a condition in which breathing pauses or becomes shallow during sleep. Sleep apnea is most commonly caused by a collapsed or blocked airway. People with sleep apnea usually snore loudly. They may have times when they gasp and stop breathing for 10 seconds or more during sleep. This may happen many times during the night. The breaks in breathing also interrupt the deep sleep that you need to feel rested. Even if you do not completely wake up from the gaps in  breathing, your sleep may not be restful and you feel tired during the day. You may also have a headache in the morning and low energy during the day, and you may feel anxious or depressed. How can sleep apnea affect me? Sleep apnea increases your chances  of extreme tiredness during the day (daytime fatigue). It can also increase your risk for health conditions, such as: Heart attack. Stroke. Obesity. Type 2 diabetes. Heart failure. Irregular heartbeat. High blood pressure. If you have daytime fatigue as a result of sleep apnea, you may be more likely to: Perform poorly at school or work. Fall asleep while driving. Have difficulty with attention. Develop depression or anxiety. Have sexual dysfunction. What actions can I take to manage sleep apnea? Sleep apnea treatment  If you were given a device to open your airway while you sleep, use it only as told by your health care provider. You may be given: An oral appliance. This is a custom-made mouthpiece that shifts your lower jaw forward. A continuous positive airway pressure (CPAP) device. This device blows air through a mask when you breathe out (exhale). A nasal expiratory positive airway pressure (EPAP) device. This device has valves that you put into each nostril. A bi-level positive airway pressure (BIPAP) device. This device blows air through a mask when you breathe in (inhale) and breathe out (exhale). You may need surgery if other treatments do not work for you. Sleep habits Go to sleep and wake up at the same time every day. This helps set your internal clock (circadian rhythm) for sleeping. If you stay up later than usual, such as on weekends, try to get up in the morning within 2 hours of your normal wake time. Try to get at least 7-9 hours of sleep each night. Stop using a computer, tablet, and mobile phone a few hours before bedtime. Do not take long naps during the day. If you nap, limit it to 30 minutes. Have a relaxing bedtime routine. Reading or listening to music may relax you and help you sleep. Use your bedroom only for sleep. Keep your television and computer out of your bedroom. Keep your bedroom cool, dark, and quiet. Use a supportive mattress and pillows. Follow  your health care provider's instructions for other changes to sleep habits. Nutrition Do not eat heavy meals in the evening. Do not have caffeine in the later part of the day. The effects of caffeine can last for more than 5 hours. Follow your health care provider's or dietitian's instructions for any diet changes. Lifestyle     Do not drink alcohol before bedtime. Alcohol can cause you to fall asleep at first, but then it can cause you to wake up in the middle of the night and have trouble getting back to sleep. Do not use any products that contain nicotine or tobacco. These products include cigarettes, chewing tobacco, and vaping devices, such as e-cigarettes. If you need help quitting, ask your health care provider. Medicines Take over-the-counter and prescription medicines only as told by your health care provider. Do not use over-the-counter sleep medicine. You can become dependent on this medicine, and it can make sleep apnea worse. Do not use medicines, such as sedatives and narcotics, unless told by your health care provider. Activity Exercise on most days, but avoid exercising in the evening. Exercising near bedtime can interfere with sleeping. If possible, spend time outside every day. Natural light helps regulate your circadian rhythm. General information Lose weight if you need to,  and maintain a healthy weight. Keep all follow-up visits. This is important. If you are having surgery, make sure to tell your health care provider that you have sleep apnea. You may need to bring your device with you. Where to find more information Learn more about sleep apnea and daytime fatigue from: American Sleep Association: sleepassociation.org National Sleep Foundation: sleepfoundation.org National Heart, Lung, and Blood Institute: [employer]DryCleaner.gl Summary Sleep apnea is a condition in which breathing pauses or becomes shallow during sleep. Sleep apnea can cause daytime fatigue and other  serious health conditions. You may need to wear a device while sleeping to help keep your airway open. If you are having surgery, make sure to tell your health care provider that you have sleep apnea. You may need to bring your device with you. Making changes to sleep habits, diet, lifestyle, and activity can help you manage sleep apnea. This information is not intended to replace advice given to you by your health care provider. Make sure you discuss any questions you have with your health care provider. Document Revised: 11/04/2020 Document Reviewed: 03/06/2020 Elsevier Patient Education  2023 [employer].

## 2022-08-24 NOTE — Progress Notes (Signed)
SLEEP MEDICINE CLINIC    Provider:  Melvyn Novas, MD  Primary Care Physician:  Lucky Cowboy, MD [address] [place] [address]     Referring Provider: Adela Glimpse, Np [address] [place],  [place] [address]          Chief Complaint according to patient   Patient presents with:     New Patient (Initial Visit)           HISTORY OF PRESENT ILLNESS:  Emily Nolan is a 47 y.o. female patient who is seen upon referral by PCP on 08/24/2022  for a new evaluation of sleep apnea. .  Chief concern according to patient :  I had a sleep study in 2016 and was dx with OSA and couldn't get used to CPAP . I saw Dr Toni Arthurs  who did a HST through her office and for the last 4-5 years I have used her mouthguard. I have now seen ENT , first ENT MD was seen in 2017,  he recommended nasal septal deviation adenoid and tonsillectomy, UPPP- the second ENT surgeon recommended tongue surgery after endoscopy with Dr Jenne Pane, MD"     I have the pleasure of seeing Emily Nolan 08/24/22 a right-handed female with a known sleep disorder, who wants to start over with apnea treatment.    The patient had the first sleep study in the year 2016  with a result of an AHI ( Apnea Hypopnea index) of 13/ h.    Sleep relevant medical history: history of GERD, Nocturia 2-3,  no deviated septum repair, no UPPP. Migraines, obesity -BMI30.  Has been on phentermine,  had Covid 2 times.     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 The patient currently works regular office hours from home 3 d/ week  Tobacco use: none .  ETOH use ; 2-3 drinks a week, Caffeine intake in form of Coffee( 1 a day) Soda( /) Tea ( or a tea- ) , no energy drinks Exercise - not regular .   Hobbies : none  physical    Sleep habits are as follows: The patient's dinner time is between 6 PM. The patient goes to bed at 10-11 PM and continues to sleep for 7 hours, wakes from choking snoring- not as often from  bathroom breaks,    The preferred sleep position is laterally, but wakes up supine , with the support of 1-2 pillows. Dreams are reportedly frequent/ not vivid. The patient wakes up spontaneously before an alarm, at 6.30 . 6.45  AM is the usual rise time.  She reports not feeling refreshed or restored in AM, with symptoms such as dry mouth, morning headaches, and residual fatigue. She had some cluster headaches - sharp pains , migraines. Naps are taken infrequently, lasting from 20 to 40 minutes and are  refreshing .    Review of Systems: Out of a complete 14 system review, the patient complains of only the following symptoms, and all other reviewed systems are negative.:   She felt  energized on phentermine.   Fatigue, sleepiness , snoring, fragmented sleep, Insomnia, Nocturia    How likely are you to doze in the following situations: 0 = not likely, 1 = slight chance, 2 = moderate  chance, 3 = high chance   Sitting and Reading? Watching Television? Sitting inactive in a public place (theater or meeting)? As a passenger in a car for an hour without a break? Lying down in the afternoon when circumstances permit? Sitting and talking to someone? Sitting quietly after lunch without alcohol? In a car, while stopped for a few minutes in traffic?   Total = 10 / 24 points   FSS endorsed at 35/ 63 points.   Social History   Socioeconomic History   Marital status: Married    Spouse name: Not on file   Number of children: 3   Years of education: Not on file   Highest education level: Not on file  Occupational History   Not on file  Tobacco Use   Smoking status: Never   Smokeless tobacco: Never  Vaping Use   Vaping Use: Never used  Substance and Sexual Activity   Alcohol use: Not  Currently    Comment: rare   Drug use: No   Sexual activity: Not Currently    Partners: Male    Birth control/protection: None  Other Topics Concern   Not on file  Social History Narrative   Not on file   Social Determinants of Health   Financial Resource Strain: Not on file  Food Insecurity: Not on file  Transportation Needs: Not on file  Physical Activity: Not on file  Stress: Not on file  Social Connections: Not on file    Family History  Problem Relation Age of Onset   Hypertension Mother    Hypertension Father    Heart disease Father    Alcohol abuse Father    Hyperlipidemia Father    Stroke Paternal Uncle    Diabetes Paternal Uncle    Heart disease Maternal Grandmother    Breast cancer Paternal Aunt    Breast cancer Paternal Aunt    Cancer Paternal Grandmother        abdominal - unsure what    Autism Daughter    Anxiety disorder Daughter     Past Medical History:  Diagnosis Date   Anxiety    Chronic cholecystitis    with stones   Diverticulosis of colon 02/14/2019   Fatty liver 02/14/2019   Per CT 02/2019   GERD (gastroesophageal reflux disease)    Headache    migraines   History of kidney stones    HSV-2 infection    Hypertension    Mild obstructive sleep apnea    study 07-12-2014 in epic,  mild osa w/ hypopnea AHI 13/hr--- 08-25-2017 per pt uses dental device   Pelvic pain in female    PONV (postoperative nausea and vomiting)    Wears glasses     Past Surgical History:  Procedure Laterality Date   BREAST CYST EXCISION Left    2016?   CHOLECYSTECTOMY N/A 08/01/2019   Procedure: LAPAROSCOPIC CHOLECYSTECTOMY;  Surgeon: Almond Lint, MD;  Location: [facility] OR;  Service: General;  Laterality: N/A;   DILATION AND CURETTAGE OF UTERUS  age 89   w/ suction for miscarriage   ENDOMETRIAL ABLATION N/A 09/01/2017   Procedure: ENDOMETRIAL ABLATION;  Surgeon: Lavina Hamman, MD;  Location: [facility];  Service: Gynecology;  Laterality: N/A;   USING MINERVA   ESSURE TUBAL LIGATION Bilateral Jan or Feb 2012   dr meisinger office   LAPAROSCOPIC BILATERAL SALPINGECTOMY Bilateral 09/01/2017   Procedure: LAPAROSCOPIC BILATERAL SALPINGECTOMY;  Surgeon: Lavina Hamman, MD;  Location: [facility];  Service: Gynecology;  Laterality: Bilateral;  DR request RNFA   MASS EXCISION Left 01/14/2016   Procedure: EXCISION OF LEFT BREAST MASS;  Surgeon: Almond Lint, MD;  Location: [facility] OR;  Service: General;  Laterality: Left;   WISDOM TOOTH EXTRACTION       Current Outpatient Medications on File Prior to Visit  Medication Sig Dispense Refill   buPROPion (WELLBUTRIN XL) 150 MG 24 hr tablet Take  1 tablet  every Morning  for Mood, Focus & Concentration       (Dx:   f41.8 )                  /     TAKE                          BY                          MOUTH 90 tablet 3   Cholecalciferol (VITAMIN D) 125 MCG (5000 UT) CAPS Take 5,000 Units by mouth daily.     escitalopram (LEXAPRO) 20 MG tablet Take  1 tablet  Daily  for Mood  (Dx: f41.8) 90 tablet 3   famotidine (PEPCID) 40 MG tablet Take 1 tablet (40 mg total) by mouth at bedtime. 90 tablet 3   losartan-hydrochlorothiazide (HYZAAR) 50-12.5 MG tablet TAKE 1 TABLET BY MOUTH EVERY DAY 30 tablet 11   pantoprazole (PROTONIX) 40 MG tablet TAKE 1 TABLET BY MOUTH TWICE A DAY 180 tablet 3   Rimegepant Sulfate (NURTEC) 75 MG TBDP 1 tab at onset of headache, can redose in 1 hour if headache is not resolved no more than 2 in 24 hours 8 tablet 2   Semaglutide-Weight Management (WEGOVY) 0.25 MG/0.5ML SOAJ Inject 0.25 mg into the skin once a week. 2 mL 2   topiramate (TOPAMAX) 50 MG tablet TAKE 1 TABLET BY MOUTH TWICE A DAY 60 tablet 2   valACYclovir (VALTREX) 500 MG tablet Take 1 tablet (500 mg total) by mouth 2 (two) times daily. (Patient taking differently: Take 500 mg by mouth 2 (two) times daily as needed (outbreaks). As needed) 60 tablet 1   ALPRAZolam (XANAX) 0.25 MG tablet TAKE 1/2 TO 1 TABLET  ONCE OR TWICE DAILY AS NEEDED FOR ANXIETY ATTACKS (Patient not taking: Reported on 08/24/2022) 60 tablet 0   phentermine (ADIPEX-P) 37.5 MG tablet TAKE 0.5 TO 1 TABLET BY MOUTH EVERY MORNING FOR DIETING AND WEIGHT LOSS (Patient not taking: Reported on 08/24/2022) 30 tablet 0   No current facility-administered medications on file prior to visit.    Allergies  Allergen Reactions   Codeine Nausea And Vomiting   Azithromycin Nausea And Vomiting   Chlorhexidine Gluconate Rash    CHG     DIAGNOSTIC DATA (LABS, IMAGING, TESTING) - I reviewed patient records, labs, notes, testing and imaging myself where available.  Lab Results  Component Value Date   WBC 7.1 07/20/2022   HGB 14.9 07/20/2022   HCT 43.3 07/20/2022   MCV 93.9 07/20/2022   PLT 283 07/20/2022      Component Value Date/Time   NA 140 07/20/2022 1114   K 3.5 07/20/2022 1114   CL 100 07/20/2022 1114   CO2 30 07/20/2022 1114   GLUCOSE 70 07/20/2022 1114   BUN 10 07/20/2022 1114   CREATININE 0.52 07/20/2022 1114   CALCIUM 9.4 07/20/2022 1114   PROT 7.8 07/20/2022 1114   ALBUMIN 3.8 07/29/2019 1347  AST 20 07/20/2022 1114   ALT 36 (H) 07/20/2022 1114   ALKPHOS 47 07/29/2019 1347   BILITOT 0.4 07/20/2022 1114   GFRNONAA 111 07/13/2020 1614   GFRAA 128 07/13/2020 1614   Lab Results  Component Value Date   CHOL 171 07/20/2022   HDL 56 07/20/2022   LDLCALC 87 07/20/2022   TRIG 180 (H) 07/20/2022   CHOLHDL 3.1 07/20/2022   Lab Results  Component Value Date   HGBA1C 5.5 07/20/2022   Lab Results  Component Value Date   VITAMINB12 343 05/06/2020   Lab Results  Component Value Date   TSH 1.44 07/20/2022    PHYSICAL EXAM:  Today's Vitals   08/24/22 0844  BP: (!) 142/92  Pulse: 85  Weight: 172 lb 6.4 oz (78.2 kg)  Height: 5\' 3"  (1.6 m)   Body mass index is 30.54 kg/m.   Wt Readings from Last 3 Encounters:  08/24/22 172 lb 6.4 oz (78.2 kg)  07/20/22 173 lb 6.4 oz (78.7 kg)  12/06/21 163 lb 3.2 oz (74  kg)     Ht Readings from Last 3 Encounters:  08/24/22 5\' 3"  (1.6 m)  07/20/22 5\' 3"  (1.6 m)  12/06/21 5\' 3"  (1.6 m)      General: The patient is awake, alert and appears not in acute distress. The patient is well groomed. Head: Normocephalic, atraumatic. Neck is supple. Mallampati 3,   neck circumference:15 inches.  Nasal airflow is reduced  patency.   Retrognathia is not seen.  Dental status:  crowded.  Narrow.  Cardiovascular:  Regular rate and cardiac rhythm by pulse,  without distended neck veins. Respiratory: Lungs are clear to auscultation.  Skin:  Without evidence of ankle edema, or rash. Trunk: The patient's posture is erect.   NEUROLOGIC EXAM: The patient is awake and alert, oriented to place and time.   Memory subjective described as intact.  Attention span & concentration ability appears normal.  Speech is fluent,  without  dysarthria, dysphonia or aphasia.  Mood and affect are appropriate.   Cranial nerves: no loss of smell or taste reported  Pupils are equal and briskly reactive to light. Funduscopic exam deferred.  Reports dry eyes.  Extraocular movements in vertical and horizontal planes were intact and without nystagmus. No Diplopia. Visual fields by finger perimetry are intact. Hearing was intact to soft voice and finger rubbing.    Facial sensation intact to fine touch.   Facial motor strength is symmetric and tongue and uvula move midline.  Neck ROM : rotation, tilt and flexion extension were normal for age and shoulder shrug was symmetrical.    Motor exam:  Symmetric bulk, tone and ROM.   Normal tone without cog- wheeling, symmetric grip strength .   Sensory:  Fine touch, pinprick and vibration were tested  and  normal.  Proprioception tested in the upper extremities was normal.   Coordination: Rapid alternating movements in the fingers/hands were of normal speed.  The Finger-to-nose maneuver was intact without evidence of ataxia, dysmetria or tremor.    Gait and station: Patient could rise unassisted from a seated position, walked without assistive device.  Stance is of normal width/ base and the patient turned with 3 steps.  Toe and heel walk were deferred.  Deep tendon reflexes: in the  upper and lower extremities are symmetric 1 plus and intact.  Babinski response was deferred.    ASSESSMENT AND PLAN 47 y.o. year old female  here with:    1)  History  of MILD OSA, first diagnosed in 2016, by dr Maple Hudson, and again by [facility] 2018. Currently still snoring on dental device, easily falling asleep.  2)   Apnea events in supine , while on dental device.  The risk factors for Mrs. Shepherd condition of obstructive's sleep apnea include mild to moderate nasal congestion, a low hanging uvula with lateral restriction of the upper airway, a small and crowded dentition allowing the tongue not to relax other than falling backwards.  This also fits her symptom of loud snoring and choking in sleep should she end up in a supine position.    HST : The patient's insurance will certainly insist on a home sleep test, and we will order this first and discuss the findings I would recommend likely to have another attempt at positive airway pressure therapy either with CPAP or BiPAP and certainly with various masks or interfaces that the patient should try while reclined.  Comfort is more important here than complete alleviation of apnea.  The baseline apnea in 2016 was mild and even a reduction by 50 or 60% would actually be placing this patient in a normal category.  It may also affect her morning headaches which can be a sign of sleep hypoxia.  The home sleep test will also answer questions if this part of sleep disordered breathing is present or not.  I plan to follow up either personally or through our NP within 2-4 months.   I would like to thank Lucky Cowboy, MD and Adela Glimpse, Np [address] [place],  [place] [address] for allowing me to  meet with and to take care of this pleasant patient.   CC: I will share my notes with Dr Toni Arthurs , DDS.  After spending a total time of  45  minutes face to face and additional time for physical and neurologic examination, review of laboratory studies,  personal review of imaging studies, reports and results of other testing and review of referral information / records as far as provided in visit,   Electronically signed by: Melvyn Novas, MD 08/24/2022 8:57 AM  [facility] Board certified by The [employer] of Sleep Medicine and Diplomate of the [employer] of Sleep Medicine. Board certified In Neurology through the ABPN, Fellow of the [employer] of Neurology. Medical Director of [facility].

## 2022-08-30 ENCOUNTER — Telehealth: Payer: Self-pay | Admitting: Neurology

## 2022-08-30 NOTE — Telephone Encounter (Signed)
5/21:lvm-mla 08/24/22 [employer] no auth req EE

## 2022-09-13 ENCOUNTER — Ambulatory Visit: Payer: 59 | Admitting: Neurology

## 2022-09-13 DIAGNOSIS — G4733 Obstructive sleep apnea (adult) (pediatric): Secondary | ICD-10-CM | POA: Diagnosis not present

## 2022-09-13 DIAGNOSIS — R519 Headache, unspecified: Secondary | ICD-10-CM

## 2022-09-13 DIAGNOSIS — R0683 Snoring: Secondary | ICD-10-CM

## 2022-09-13 DIAGNOSIS — F519 Sleep disorder not due to a substance or known physiological condition, unspecified: Secondary | ICD-10-CM

## 2022-09-13 DIAGNOSIS — G478 Other sleep disorders: Secondary | ICD-10-CM

## 2022-09-14 ENCOUNTER — Encounter: Payer: Self-pay | Admitting: Nurse Practitioner

## 2022-09-19 NOTE — Progress Notes (Signed)
[facility] [name] 47 year old female 06/21/1975   HOME SLEEP TEST REPORT ( MAIL out-by Watch PAT)   STUDY DATE:  09-19-2022   ORDERING CLINICIAN: Melvyn Novas, MD  REFERRING CLINICIAN: Dr Oneta Rack, MD and Adela Glimpse, NP    CLINICAL INFORMATION/HISTORY: 08-24-2022:  Emily Nolan is a 47 y.o. female patient who is seen upon referral by PCP on 08/24/2022  for a new evaluation of sleep apnea. The patient had the first sleep study in the year 2016  with a result of an AHI ( Apnea Hypopnea index) of 13/ h. .  Chief concern according to patient :  I ordered  a HST through her office and for the last 4-5 years I have used her mouthguard. I have now seen ENT again , first ENT MD was seen in 2017,  he recommended nasal septal deviation adenoid and tonsillectomy, UPPP- the second ENT surgeon recommended tongue surgery after endoscopy with Dr Jenne Pane, MD". She reports not feeling refreshed or restored in AM, with symptoms such as dry mouth, morning headaches, and residual fatigue. She had some cluster headaches - sharp pains , migraines.    I have the pleasure of seeing Emily Nolan 08/24/22 a right-handed female with a known sleep disorder, who wants to start over with apnea treatment.    Epworth sleepiness score: 10/24. FSS at 35/ 63 points.    BMI:30.5  kg/m   Neck Circumference:    FINDINGS:   Sleep Summary:   Total Recording Time (hours, min):   8 hours 38 minutes  Total Sleep Time (hours, min): 7 hours 12 minutes               Percent REM (%): 25.5%                                      Respiratory Indices by AASM criteria:   Calculated pAHI (per hour):    10/h                         REM pAHI:    17.5/h                                             NREM pAHI:   7.4/h    This home sleep test device calculated no central sleep apnea to be present.                         Positional AHI:   There were 220 minutes of supine sleep recorded  associated with an AHI of 12.6/h versus a nonsupine sleep time of 211 minutes with an AHI of 7.3/h.  Snoring:   Mean volume was 40 dB which is close to threshold.  There was nearly no snoring recorded.  Only 6.8% of total sleep time were associated with an activation of the vibration sensor on the chest wall.                                             Oxygen Saturation Statistics:  O2 Saturation Range (%):   Between the nadir at 86% of maximum saturation of 99% and the mean saturation at 95%.  There were very brief desaturations noted.                                    O2 Saturation (minutes) <89%: Only 0.8 minutes         Pulse Rate Statistics:   Pulse Mean (bpm):   83 bpm              Pulse Range:   Between a minimum heart rate at 65 and a maximum at 109 bpm.              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  I would recommend weight loss, avoiding supine sleep and a trial of auto titration CPAP.  I find the overall apnea-hypopnea index too low to justify an implantation device.  And I would consider CPAP optional.   RECOMMENDATION: Lets give CPAP another trial: Therapy will be by Auto- titration CPAP device by [employer] , starting at 5 cm water pressure with a maximum pressure of 14 cm of water, 2 cm EPR, a heated humidifier and a mask of patient's choice and comfort to be fitted by the DME. All problems with CPAP use in the first 30 days have to be addressed with the durable medical equipment company directly.  This involves any need of we Please inform the patient that she needs to try 4 hours each day of CPAP use and it would be beneficial if she could exceed those 4 hours.  For the first visit in the sleep clinic after CPAP is initiated she should bring the machine and all attachments with her.    INTERPRETING PHYSICIAN:   Melvyn Novas, MD    [facility].

## 2022-09-29 ENCOUNTER — Other Ambulatory Visit: Payer: Self-pay | Admitting: Internal Medicine

## 2022-09-29 DIAGNOSIS — F418 Other specified anxiety disorders: Secondary | ICD-10-CM

## 2022-10-01 ENCOUNTER — Encounter: Payer: Self-pay | Admitting: Neurology

## 2022-10-02 NOTE — Addendum Note (Signed)
Addended by: Melvyn Novas on: 10/02/2022 03:08 PM   Modules accepted: Orders

## 2022-10-02 NOTE — Procedures (Signed)
       [facility] [place] [name] 47 year old female 01/25/1976   HOME SLEEP TEST REPORT ( MAIL out-by Watch PAT)   STUDY DATE:  09-19-2022   ORDERING CLINICIAN: Linzi Ohlinger, MD  REFERRING CLINICIAN: Dr McKeown, MD and Tonya Cranford, NP    CLINICAL INFORMATION/HISTORY: 08-24-2022:  Emily Nolan is a 47 y.o. female patient who is seen upon referral by PCP on 08/24/2022  for a new evaluation of sleep apnea. The patient had the first sleep study in the year 2016  with a result of an AHI ( Apnea Hypopnea index) of 13/ h. .  Chief concern according to patient :  I ordered  a HST through her office and for the last 4-5 years I have used her mouthguard. I have now seen ENT again , first ENT MD was seen in 2017,  he recommended nasal septal deviation adenoid and tonsillectomy, UPPP- the second ENT surgeon recommended tongue surgery after endoscopy with Dr Bates, MD". She reports not feeling refreshed or restored in AM, with symptoms such as dry mouth, morning headaches, and residual fatigue. She had some cluster headaches - sharp pains , migraines.    I have the pleasure of seeing [name] 08/24/22 a right-handed female with a known sleep disorder, who wants to start over with apnea treatment.    Epworth sleepiness score: 10/24. FSS at 35/ 63 points.    BMI:30.5  kg/m   Neck Circumference:    FINDINGS:   Sleep Summary:   Total Recording Time (hours, min):   8 hours 38 minutes  Total Sleep Time (hours, min): 7 hours 12 minutes               Percent REM (%): 25.5%                                      Respiratory Indices by AASM criteria:   Calculated pAHI (per hour):    10/h                         REM pAHI:    17.5/h                                             NREM pAHI:   7.4/h    This home sleep test device calculated no central sleep apnea to be present.                         Positional AHI:   There were 220 minutes of supine sleep recorded  associated with an AHI of 12.6/h versus a nonsupine sleep time of 211 minutes with an AHI of 7.3/h.  Snoring:   Mean volume was 40 dB which is close to threshold.  There was nearly no snoring recorded.  Only 6.8% of total sleep time were associated with an activation of the vibration sensor on the chest wall.                                             Oxygen Saturation Statistics:      O2 Saturation Range (%):   Between the nadir at 86% of maximum saturation of 99% and the mean saturation at 95%.  There were very brief desaturations noted.                                    O2 Saturation (minutes) <89%: Only 0.8 minutes         Pulse Rate Statistics:   Pulse Mean (bpm):   83 bpm              Pulse Range:   Between a minimum heart rate at 65 and a maximum at 109 bpm.              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  I would recommend weight loss, avoiding supine sleep and a trial of auto titration CPAP.  I find the overall apnea-hypopnea index too low to justify an implantation device.  And I would consider CPAP optional.   RECOMMENDATION: Lets give CPAP another trial: Therapy will be by Auto- titration CPAP device by [employer] , starting at 5 cm water pressure with a maximum pressure of 14 cm of water, 2 cm EPR, a heated humidifier and a mask of patient's choice and comfort to be fitted by the DME. All problems with CPAP use in the first 30 days have to be addressed with the durable medical equipment company directly.  This involves any need of we Please inform the patient that she needs to try 4 hours each day of CPAP use and it would be beneficial if she could exceed those 4 hours.  For the first visit in the sleep clinic after CPAP is initiated she should bring the machine and all attachments with her.    INTERPRETING PHYSICIAN:   Cloria Ciresi, MD    [facility].                         

## 2022-10-04 ENCOUNTER — Other Ambulatory Visit: Payer: Self-pay | Admitting: Nurse Practitioner

## 2022-10-04 DIAGNOSIS — E669 Obesity, unspecified: Secondary | ICD-10-CM

## 2022-10-04 DIAGNOSIS — E782 Mixed hyperlipidemia: Secondary | ICD-10-CM

## 2022-10-04 DIAGNOSIS — R0989 Other specified symptoms and signs involving the circulatory and respiratory systems: Secondary | ICD-10-CM

## 2022-10-04 MED ORDER — WEGOVY 1 MG/0.5ML ~~LOC~~ SOAJ
1.0000 mg | SUBCUTANEOUS | 2 refills | Status: DC
Start: 2022-10-04 — End: 2022-10-10

## 2022-10-05 ENCOUNTER — Other Ambulatory Visit: Payer: Self-pay | Admitting: Nurse Practitioner

## 2022-10-05 DIAGNOSIS — R0989 Other specified symptoms and signs involving the circulatory and respiratory systems: Secondary | ICD-10-CM

## 2022-10-05 DIAGNOSIS — E669 Obesity, unspecified: Secondary | ICD-10-CM

## 2022-10-05 DIAGNOSIS — E782 Mixed hyperlipidemia: Secondary | ICD-10-CM

## 2022-10-23 ENCOUNTER — Other Ambulatory Visit: Payer: Self-pay | Admitting: Nurse Practitioner

## 2022-10-23 DIAGNOSIS — F418 Other specified anxiety disorders: Secondary | ICD-10-CM

## 2022-10-27 ENCOUNTER — Ambulatory Visit: Payer: 59 | Admitting: Nurse Practitioner

## 2022-11-03 ENCOUNTER — Ambulatory Visit: Payer: 59 | Admitting: Nurse Practitioner

## 2022-11-10 ENCOUNTER — Encounter: Payer: Self-pay | Admitting: Nurse Practitioner

## 2022-11-10 ENCOUNTER — Other Ambulatory Visit (HOSPITAL_COMMUNITY): Payer: Self-pay

## 2022-11-10 ENCOUNTER — Ambulatory Visit (INDEPENDENT_AMBULATORY_CARE_PROVIDER_SITE_OTHER): Payer: 59 | Admitting: Nurse Practitioner

## 2022-11-10 VITALS — BP 140/90 | HR 83 | Temp 98.1°F | Ht 63.0 in | Wt 179.8 lb

## 2022-11-10 DIAGNOSIS — Z79899 Other long term (current) drug therapy: Secondary | ICD-10-CM

## 2022-11-10 DIAGNOSIS — E66811 Obesity, class 1: Secondary | ICD-10-CM

## 2022-11-10 DIAGNOSIS — K760 Fatty (change of) liver, not elsewhere classified: Secondary | ICD-10-CM

## 2022-11-10 DIAGNOSIS — E669 Obesity, unspecified: Secondary | ICD-10-CM

## 2022-11-10 DIAGNOSIS — R599 Enlarged lymph nodes, unspecified: Secondary | ICD-10-CM

## 2022-11-10 DIAGNOSIS — E782 Mixed hyperlipidemia: Secondary | ICD-10-CM

## 2022-11-10 DIAGNOSIS — E559 Vitamin D deficiency, unspecified: Secondary | ICD-10-CM

## 2022-11-10 DIAGNOSIS — G4730 Sleep apnea, unspecified: Secondary | ICD-10-CM

## 2022-11-10 DIAGNOSIS — R0989 Other specified symptoms and signs involving the circulatory and respiratory systems: Secondary | ICD-10-CM

## 2022-11-10 DIAGNOSIS — K591 Functional diarrhea: Secondary | ICD-10-CM

## 2022-11-10 DIAGNOSIS — G4710 Hypersomnia, unspecified: Secondary | ICD-10-CM

## 2022-11-10 DIAGNOSIS — F418 Other specified anxiety disorders: Secondary | ICD-10-CM

## 2022-11-10 DIAGNOSIS — G43909 Migraine, unspecified, not intractable, without status migrainosus: Secondary | ICD-10-CM

## 2022-11-10 MED ORDER — WEGOVY 0.5 MG/0.5ML ~~LOC~~ SOAJ
0.5000 mg | SUBCUTANEOUS | 2 refills | Status: AC
Start: 2022-11-10 — End: ?
  Filled 2022-11-10: qty 2, 28d supply, fill #0
  Filled 2022-12-17: qty 2, 28d supply, fill #1

## 2022-11-10 NOTE — Patient Instructions (Signed)
Semaglutide Injection (Weight Management) What is this medication? SEMAGLUTIDE (SEM a GLOO tide) promotes weight loss. It may also be used to maintain weight loss.  It works by decreasing appetite. It can be used to lower the risk of heart attack and stroke in people affected by excess weight. Changes to diet and exercise are often combined with this medication. This medicine may be used for other purposes; ask your health care provider or pharmacist if you have questions. COMMON BRAND NAME(S): [id number] What should I tell my care team before I take this medication? They need to know if you have any of these conditions: Diabetes Eye disease caused by diabetes History of depression or other mental health conditions Kidney disease Pancreatic disease Personal or family history of MEN 2, a condition that causes endocrine gland tumors Personal or family history of thyroid cancer Suicidal thoughts, plans, or attempt by you or a family member An unusual or allergic reaction to semaglutide, other medications, foods, dyes, or preservatives Pregnant or trying to get pregnant Breastfeeding How should I use this medication? This medication is injected under the skin. You will be taught how to prepare and give it. Take it as directed on the prescription label. It is given once every week (every 7 days). Keep taking it unless your care team tells you to stop. It is important that you put your used needles and pens in a special sharps container. Do not put them in a trash can. If you do not have a sharps container, call your pharmacist or care team to get one. A special MedGuide will be given to you by the pharmacist with each prescription and refill. Be sure to read this information carefully each time. This medication comes with INSTRUCTIONS FOR USE. Ask your pharmacist for directions on how to use this medication. Read the information carefully. Talk to your pharmacist or care team if you have  questions. Talk to your care team about the use of this medication in children. While it may be prescribed for children as young as 12 years for selected conditions, precautions do apply. Overdosage: If you think you have taken too much of this medicine contact a poison control center or emergency room at once. NOTE: This medicine is only for you. Do not share this medicine with others. What if I miss a dose? If you miss a dose and the next scheduled dose is more than 2 days away, take the missed dose as soon as possible. If you miss a dose and the next scheduled dose is less than 2 days away, do not take the missed dose. Take the next dose at your regular time. Do not take double or extra doses. If you miss your dose for 2 weeks or more, take the next dose at your regular time or call your care team to talk about how to restart this medication. What may interact with this medication? Insulin and other medications for diabetes This list may not describe all possible interactions. Give your health care provider a list of all the medicines, herbs, non-prescription drugs, or dietary supplements you use. Also tell them if you smoke, drink alcohol, or use illegal drugs. Some items may interact with your medicine. What should I watch for while using this medication? Visit your care team for regular checks on your progress. It may be some time before you see the benefit from this medication. Drink plenty of fluids while taking this medication. Check with your care team if you have severe  diarrhea, nausea, and vomiting, or if you sweat a lot. The loss of too much body fluid may make it dangerous for you to take this medication. This medication may affect blood sugar levels. Ask your care team if changes in diet or medications are needed if you have diabetes. Talk to your care team if you may be pregnant. Losing weight while pregnant is not advised and may cause harm to the fetus. Talk to your care team for more  information. What side effects may I notice from receiving this medication? Side effects that you should report to your care team as soon as possible: Allergic reactions--skin rash, itching, hives, swelling of the face, lips, tongue, or throat Change in vision Dehydration--increased thirst, dry mouth, feeling faint or lightheaded, headache, dark yellow or brown urine Gallbladder problems--severe stomach pain, nausea, vomiting, fever Heart palpitations--rapid, pounding, or irregular heartbeat Kidney injury--decrease in the amount of urine, swelling of the ankles, hands, or feet Pancreatitis--severe stomach pain that spreads to your back or gets worse after eating or when touched, fever, nausea, vomiting Thoughts of suicide or self-harm, worsening mood, feelings of depression Thyroid cancer--new mass or lump in the neck, pain or trouble swallowing, trouble breathing, hoarseness Side effects that usually do not require medical attention (report these to your care team if they continue or are bothersome): Diarrhea Loss of appetite Nausea Upset stomach This list may not describe all possible side effects. Call your doctor for medical advice about side effects. You may report side effects to FDA at 1-800-FDA-1088. Where should I keep my medication? Keep out of the reach of children and pets. Refrigeration (preferred): Store in the refrigerator. Do not freeze. Keep this medication in the original container until you are ready to take it. Get rid of any unused medication after the expiration date. Room temperature: If needed, prior to cap removal, the pen can be stored at room temperature for up to 28 days. Protect from light. If it is stored at room temperature, get rid of any unused medication after 28 days or after it expires, whichever is first. It is important to get rid of the medication as soon as you no longer need it or it is expired. You can do this in two ways: Take the medication to a  medication take-back program. Check with your pharmacy or law enforcement to find a location. If you cannot return the medication, follow the directions in the MedGuide. NOTE: This sheet is a summary. It may not cover all possible information. If you have questions about this medicine, talk to your doctor, pharmacist, or health care provider.  2024 Elsevier/Gold Standard (2022-06-29 00:00:00)

## 2022-11-10 NOTE — Progress Notes (Signed)
Follow Up  Assessment and Plan:  Fatty liver Check labs, avoid tylenol, alcohol, weight loss advised.   Mixed hyperlipidemia Discussed lifestyle modifications. Recommended diet heavy in fruits and veggies, omega 3's. Decrease consumption of animal meats, cheeses, and dairy products. Remain active and exercise as tolerated. Continue to monitor. Check lipids/TSH  Labile hypertension Continue Losartan Potassium-HCTZ Discussed DASH (Dietary Approaches to Stop Hypertension) DASH diet is lower in sodium than a typical American diet. Cut back on foods that are high in saturated fat, cholesterol, and trans fats. Eat more whole-grain foods, fish, poultry, and nuts Remain active and exercise as tolerated daily.  Monitor BP at home-Call if greater than 130/80 - discussed medication is BP does not remain WNL. Check CMP/CBC  Sleep apnea with hypersomnolence Awaiting CPAP Following with Dr. Vickey Huger  Depression with anxiety Continue medications Reviewed relaxation techniques.  Sleep hygiene. Recommended mindfulness meditation and exercise.   Encouraged personality growth wand development through coping techniques and problem-solving skills. Limit/Decrease/Monitor drug/alcohol intake.    Obesity (BMI 30.0-34.9) Continue Wegovy Discussed appropriate BMI Diet modification. Physical activity. Encouraged/praised to build confidence.  Medication management All medications discussed and reviewed in full. All questions and concerns regarding medications addressed.    Vitamin D deficiency Continue supplement for goal of 60-100 Monitor Vitamin D levels  Migraine/ daily headache Nurtec as needed Topamax 50 mg daily Stay well hydrated Discussed benefit of magnesium supplement Continue to monitor  Posterior cervical lymph node edema (left side) Benign via [place] Continue to monitor  Functional diarrhea Discussed post cholecystectomy diarrhea Suggest daily probiotic OTC such as  acidophilus or lactobacillus Plan for Cologuard 02/2023 Stay well hydrated  Orders Placed This Encounter  Procedures   CBC with Differential/Platelet   COMPLETE METABOLIC PANEL WITH GFR   Lipid panel   Meds ordered this encounter  Medications   Semaglutide-Weight Management (WEGOVY) 0.5 MG/0.5ML SOAJ    Sig: Inject 0.5 mg into the skin once a week.    Dispense:  2 mL    Refill:  2    Order Specific Question:   Supervising Provider    Answer:   Lucky Cowboy [phone]   Notify office for further evaluation and treatment, questions or concerns if any reported s/s fail to improve.   The patient was advised to call back or seek an in-person evaluation if any symptoms worsen or if the condition fails to improve as anticipated.   Further disposition pending results of labs. Discussed med's effects and SE's.    I discussed the assessment and treatment plan with the patient. The patient was provided an opportunity to ask questions and all were answered. The patient agreed with the plan and demonstrated an understanding of the instructions.  Discussed med's effects and SE's. Screening labs and tests as requested with regular follow-up as recommended.  I provided 40 minutes of face-to-face time during this encounter including counseling, chart review, and critical decision making was preformed.  Today's Plan of Care is based on a patient-centered health care approach known as shared decision making - the decisions, tests and treatments allow for patient preferences and values to be balanced with clinical evidence.      Future Appointments  Date Time Provider Department Center  07/20/2023 10:00 AM Adela Glimpse, NP [facility] None    HPI  47 y.o. female  presents for a general follow up has Sleep apnea with hypersomnolence; Depression with anxiety; Hyperlipidemia; Labile hypertension; Obesity (BMI 30.0-34.9); Fatty liver; Uterine fibroid; Diverticulosis of colon; Migraines; Esophageal  dysphagia;  Sleep choking syndrome; Snoring; Morning headache; and Non-restorative sleep on their problem list.   Overall she reports feeling well today.    She has followed with Dr. Vickey Huger for updated sleep study.  Home test completed and revealed mild OSA.  She is currently awaiting evaluation for a CPAP machine through [facility].  Of note, had mouth piece with Dr. Toni Arthurs, sleep study 2016 which was no longer effective.   She was concerned for a nodule on the back of her neck that had been present for approximately 3 years.  She completed an [place] 08/12/2022 that was negative.  No follow up recommended. painful.    She has hx of depression/anxiety and continues to see a therapist since 2020, she is taking the xanax AS needed 1/2-1 tablet 2-3 x a week.  She also takes Wellbutrin, Lexapro and Topamax. Feels mood is currently stable.   She has had a history of hematuria, had cysto 10+ years ago, her last CT scan in 02/14/2019 showed bladder wall thickening suggestive of cystitis, followed up with urology with no changes.   She had gallstones and fatty liver on the CT, [place] abd 04/2019 showed Cholelithiasis, Nonspecific 9 mm hypoechoic hepatic nodule grossly stable in size since 02/14/2019 and had cholecystectomy 08/01/2019, reports has had intermittent unpredictable episodes of diarrhea. Does follow with GI.  Does not take a daily probiotic.   She has hx of frequent headaches; reports unilateral with vision changes and may have nausea; typical migraine. She has been on topiramate 50 mg with significant improvement, but recently having near daily HA in afternoon, taking ibuprofen. Also has Topamax.        BMI is Body mass index is 31.85 kg/m., she is working on diet and exercise. Successful with [facility] however hard to obtain due to supply issues.  She has been without for 3 weeks.  Wt Readings from Last 3 Encounters:  11/10/22 179 lb 12.8 oz (81.6 kg)  08/24/22 172 lb 6.4 oz (78.2 kg)  07/20/22 173 lb 6.4  oz (78.7 kg)   Has history of palpitations, normal holter 2014.  First MI in father at 49, died at 93 from MI, was alcoholic.  Her blood pressure has been controlled at home, BP is doing well with Losartan Potassium HCTZ daily, today their BP is BP: (!) 140/90 BP Readings from Last 3 Encounters:  11/10/22 (!) 140/90  08/24/22 (!) 142/92  07/20/22 138/88    She does not workout, she does some walking. She denies chest pain, shortness of breath, dizziness.    She is not on cholesterol medication and denies myalgias. Her cholesterol is at goal. The cholesterol last visit was:   Lab Results  Component Value Date   CHOL 171 07/20/2022   HDL 56 07/20/2022   LDLCALC 87 07/20/2022   TRIG 180 (H) 07/20/2022   CHOLHDL 3.1 07/20/2022    Last A1C in the office was:  Lab Results  Component Value Date   HGBA1C 5.5 07/20/2022   Patient is on Vitamin D supplement, taking 5000 IU daily    Lab Results  Component Value Date   VD25OH 40 07/20/2022     Lab Results  Component Value Date   VITAMINB12 343 05/06/2020   She is s/p endometrial ablation and BSO and she has felt better since that time.  Lab Results  Component Value Date   IRON 109 03/19/2018   TIBC 353 03/19/2018   Lab Results  Component Value Date   ALT 36 (H) 07/20/2022  Current Medications:    Current Outpatient Medications (Cardiovascular):    losartan-hydrochlorothiazide (HYZAAR) 50-12.5 MG tablet, TAKE 1 TABLET BY MOUTH EVERY DAY   Current Outpatient Medications (Analgesics):    Rimegepant Sulfate (NURTEC) 75 MG TBDP, 1 tab at onset of headache, can redose in 1 hour if headache is not resolved no more than 2 in 24 hours   Current Outpatient Medications (Other):    ALPRAZolam (XANAX) 0.25 MG tablet, TAKE 1/2 TO 1 TABLET ONCE OR TWICE DAILY AS NEEDED FOR ANXIETY ATTACKS   buPROPion (WELLBUTRIN XL) 150 MG 24 hr tablet, Take  1 tablet  every Morning  for Mood, Focus & Concentration       (Dx:   f41.8 )                   /     TAKE                          BY                          MOUTH   Cholecalciferol (VITAMIN D) 125 MCG (5000 UT) CAPS, Take 5,000 Units by mouth daily.   escitalopram (LEXAPRO) 20 MG tablet, TAKE 1 TABLET DAILY FOR MOOD (DX: F41.8)   famotidine (PEPCID) 40 MG tablet, Take 1 tablet (40 mg total) by mouth at bedtime.   pantoprazole (PROTONIX) 40 MG tablet, TAKE 1 TABLET BY MOUTH TWICE A DAY   topiramate (TOPAMAX) 50 MG tablet, TAKE 1 TABLET BY MOUTH TWICE A DAY   Semaglutide-Weight Management (WEGOVY) 1 MG/0.5ML SOAJ, INJECT 1MG  INTO THE SKIN ONCE A WEEK (Patient not taking: Reported on 11/10/2022)  Allergies:  Allergies  Allergen Reactions   Codeine Nausea And Vomiting   Azithromycin Nausea And Vomiting   Chlorhexidine Gluconate Rash    CHG   Medical History:  She has Sleep apnea with hypersomnolence; Depression with anxiety; Hyperlipidemia; Labile hypertension; Obesity (BMI 30.0-34.9); Fatty liver; Uterine fibroid; Diverticulosis of colon; Migraines; Esophageal dysphagia; Sleep choking syndrome; Snoring; Morning headache; and Non-restorative sleep on their problem list.   Health Maintenance:   Immunization History  Administered Date(s) Administered   Influenza-Unspecified 01/12/2015   PPD Test 11/11/2013   Td 05/06/2020   Tdap 04/11/2010   Patient Care Team: Lucky Cowboy, MD as PCP - General (Internal Medicine) Drema Halon, MD (Inactive) as Consulting Physician (Otolaryngology) Lavina Hamman, MD as Consulting Physician (Obstetrics and Gynecology) [place], [place] Lowella Bandy., MD as Attending Physician (Urology) Frederico Hamman, MD as Consulting Physician (Orthopedic Surgery)  Surgical History:  She has a past surgical history that includes Mass excision (Left, 01/14/2016); Dilation and curettage of uterus (age 10); Essure tubal ligation (Bilateral, Jan or Feb 2012   dr meisinger office); Laparoscopic bilateral salpingectomy (Bilateral, 09/01/2017); Endometrial  ablation (N/A, 09/01/2017); Wisdom tooth extraction; Cholecystectomy (N/A, 08/01/2019); and Breast cyst excision (Left). Family History:  Herfamily history includes Alcohol abuse in her father; Anxiety disorder in her daughter; Autism in her daughter; Breast cancer in her paternal aunt and paternal aunt; Cancer in her paternal grandmother; Diabetes in her paternal uncle; Heart disease in her father and maternal grandmother; Hyperlipidemia in her father; Hypertension in her father and mother; Stroke in her paternal uncle. Social History:  She reports that she has never smoked. She has never used smokeless tobacco. She reports that she does not currently use alcohol. She reports that she does not use  drugs.   Review of Systems: Review of Systems  Constitutional:  Negative for malaise/fatigue and weight loss.  HENT:  Negative for hearing loss and tinnitus.   Eyes:  Negative for blurred vision and double vision.  Respiratory:  Negative for cough, shortness of breath and wheezing.   Cardiovascular:  Negative for chest pain, palpitations, orthopnea, claudication and leg swelling.  Gastrointestinal:  Positive for diarrhea. Negative for abdominal pain, blood in stool, constipation, heartburn, melena, nausea and vomiting.  Genitourinary: Negative.   Musculoskeletal:  Negative for joint pain and myalgias.  Skin:  Negative for rash.  Neurological:  Positive for headaches (nearly daily, unilateral with vision changes and intermittent nausea). Negative for dizziness, tingling, sensory change and weakness.  Endo/Heme/Allergies:  Negative for polydipsia.  Psychiatric/Behavioral:  Positive for depression. Negative for hallucinations, memory loss, substance abuse and suicidal ideas. The patient is nervous/anxious. The patient does not have insomnia.   All other systems reviewed and are negative.   Physical Exam: Estimated body mass index is 31.85 kg/m as calculated from the following:   Height as of this  encounter: 5\' 3"  (1.6 m).   Weight as of this encounter: 179 lb 12.8 oz (81.6 kg). BP (!) 140/90   Pulse 83   Temp 98.1 F (36.7 C)   Ht 5\' 3"  (1.6 m)   Wt 179 lb 12.8 oz (81.6 kg)   SpO2 99%   BMI 31.85 kg/m   General Appearance: Well nourished, in no apparent distress.  Eyes: PERRLA, EOMs, conjunctiva no swelling or erythema Sinuses: No Frontal/maxillary tenderness  ENT/Mouth: Ext aud canals clear, normal light reflex with TMs without erythema, bulging. Good dentition. No erythema, swelling, or exudate on post pharynx. Tonsils not swollen or erythematous. Hearing normal.  Neck: Supple, FROM.  Respiratory: Respiratory effort normal, BS equal bilaterally without rales, rhonchi, wheezing or stridor.  Cardio: RRR without murmurs, rubs or gallops. Brisk peripheral pulses without edema.  Chest: symmetric, with normal excursions and percussion.  Breasts: defer to GYN  Abdomen: Soft, BS x 4, non-tender no guarding, rebound, hernias, masses, or organomegaly.  Lymphatics: Non tender Genitourinary: defer to GYN Musculoskeletal: Full ROM all peripheral extremities,5/5 strength, and normal gait.  Skin: Warm, dry without rashes, lesions, ecchymosis.  Neuro: Cranial nerves intact, reflexes equal bilaterally. Normal muscle tone, no cerebellar symptoms. Sensation intact.  Psych: Awake and oriented X 3, normal affect, Insight and Judgment appropriate.    Delene Morais 9:53 AM [facility]

## 2022-11-15 NOTE — Telephone Encounter (Signed)
Received communication from [facility] regarding pt. They made several attempts to contact her to get set up, they were unable to reach her. [facility] has mailed an unable to contact Letter.

## 2022-11-21 ENCOUNTER — Other Ambulatory Visit (HOSPITAL_COMMUNITY): Payer: Self-pay

## 2022-11-23 ENCOUNTER — Ambulatory Visit: Payer: 59 | Admitting: Family Medicine

## 2022-12-04 ENCOUNTER — Encounter: Payer: Self-pay | Admitting: Nurse Practitioner

## 2022-12-05 MED ORDER — AMLODIPINE BESYLATE 5 MG PO TABS: 5.0000 mg | ORAL_TABLET | Freq: Every day | ORAL | 11 refills | Status: AC

## 2022-12-18 ENCOUNTER — Other Ambulatory Visit (HOSPITAL_COMMUNITY): Payer: Self-pay

## 2022-12-19 ENCOUNTER — Telehealth: Payer: Self-pay

## 2022-12-19 ENCOUNTER — Other Ambulatory Visit: Payer: Self-pay

## 2022-12-19 NOTE — Telephone Encounter (Signed)
PA for nurtec completed.

## 2022-12-27 ENCOUNTER — Telehealth: Payer: Self-pay | Admitting: Neurology

## 2022-12-27 NOTE — Telephone Encounter (Signed)
Called pt and LVM stating that she is needing to schedule her Initial Cpap visit. DME in pt's SnapShot, the between dates are: 01/15/23-03/16/23.

## 2023-01-05 ENCOUNTER — Other Ambulatory Visit: Payer: Self-pay | Admitting: Internal Medicine

## 2023-01-05 DIAGNOSIS — F418 Other specified anxiety disorders: Secondary | ICD-10-CM

## 2023-01-10 ENCOUNTER — Other Ambulatory Visit: Payer: Self-pay | Admitting: Internal Medicine

## 2023-01-10 DIAGNOSIS — Z1231 Encounter for screening mammogram for malignant neoplasm of breast: Secondary | ICD-10-CM

## 2023-01-31 ENCOUNTER — Other Ambulatory Visit: Payer: Self-pay | Admitting: Nurse Practitioner

## 2023-01-31 DIAGNOSIS — F418 Other specified anxiety disorders: Secondary | ICD-10-CM

## 2023-01-31 IMAGING — CT CT ABD-PELV W/ CM
1 of 3 series · 13 of 32 positions shown, 19 images · IV contrast (APPLIED)
Comparison: 02/14/2019

CLINICAL DATA: Right lower quadrant abdominal pain

EXAM:
CT ABDOMEN AND PELVIS WITH CONTRAST
TECHNIQUE: Multidetector CT imaging of the abdomen and pelvis was performed
using the standard protocol following bolus administration of
intravenous contrast.
CONTRAST:  100mL [id number] IOPAMIDOL (P1IPZX-J77) INJECTION 61%

[Series 2: abd/pelvis w/cm · axial · 0.79mm/px · z∈[-444,-24]mm · 13 of 98 slices shown, 19 images]
[im 7/98  soft-tissue]
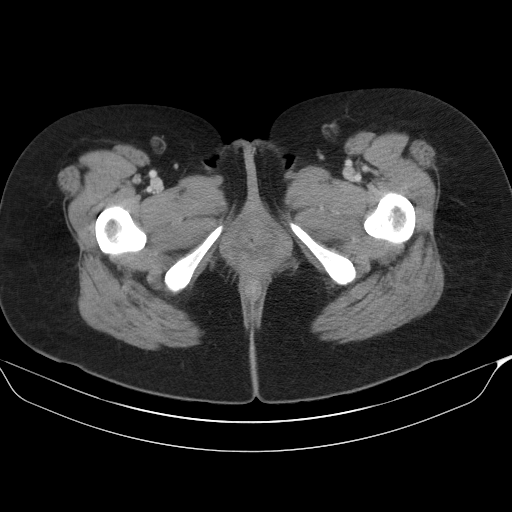
[im 7/98  bone]
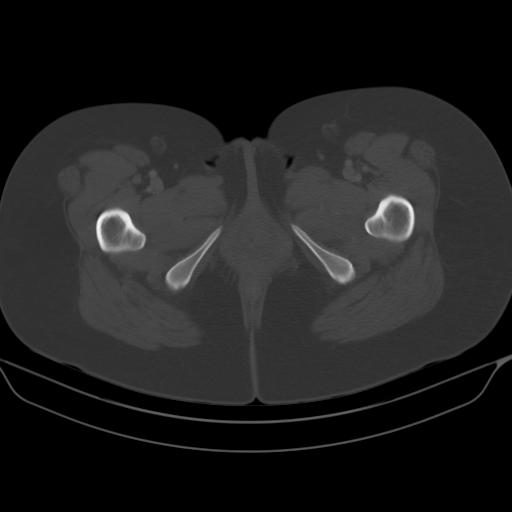
[im 14/98  soft-tissue]
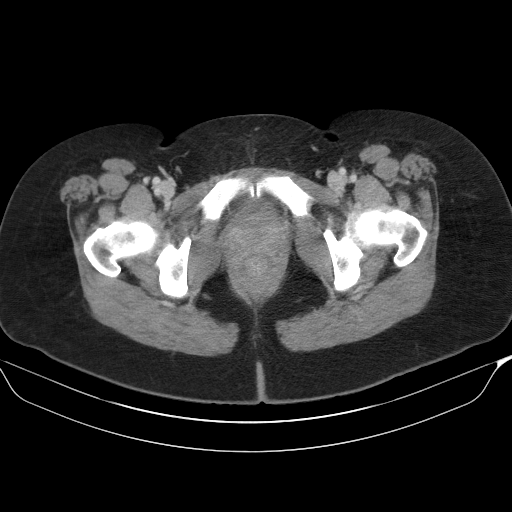
[im 21/98  soft-tissue]
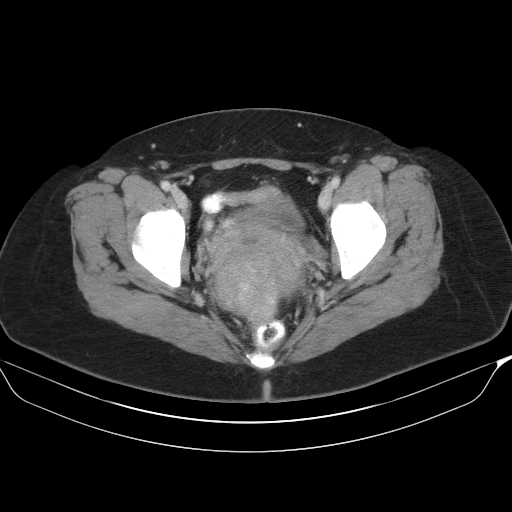
[im 28/98  soft-tissue]
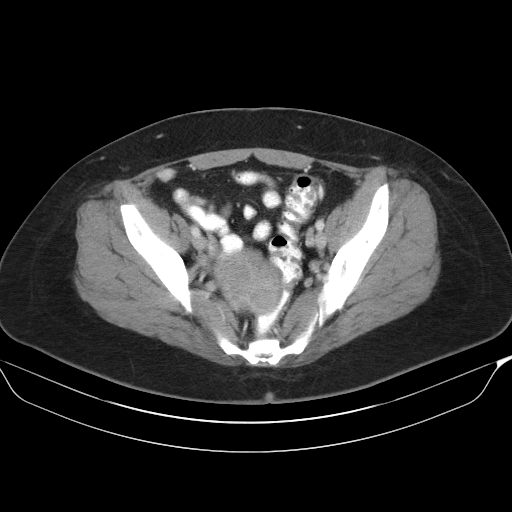
[im 35/98  soft-tissue]
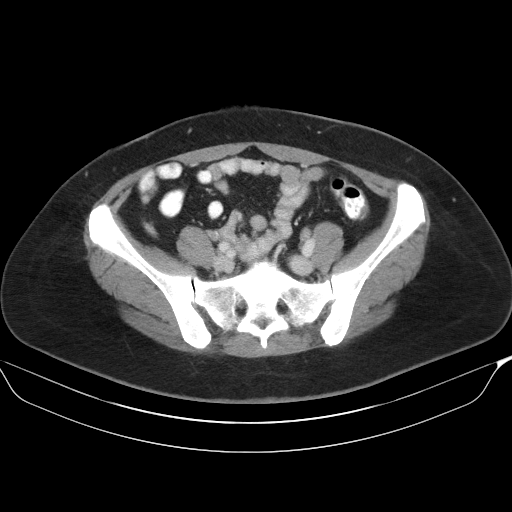
[im 42/98  soft-tissue]
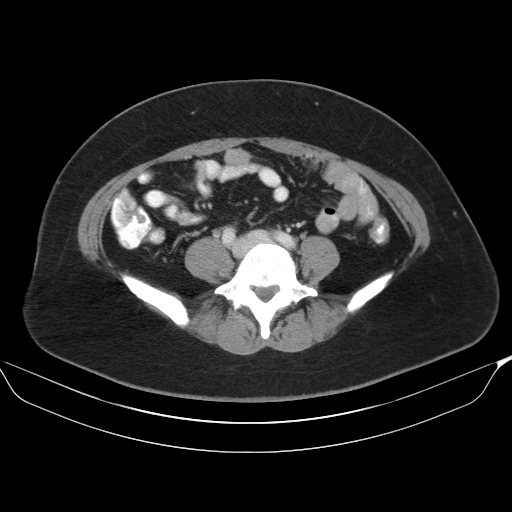
[im 49/98  soft-tissue]
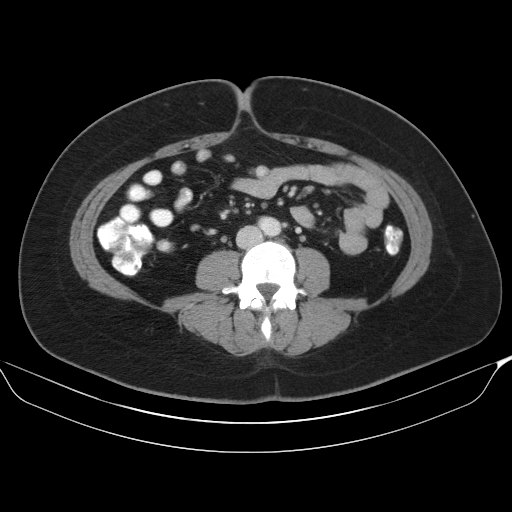
[im 56/98  soft-tissue]
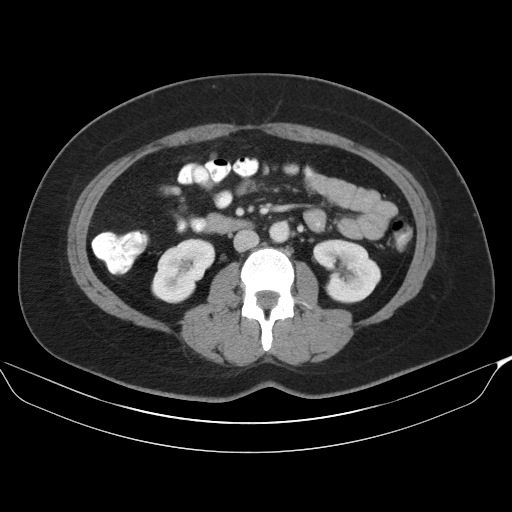
[im 63/98  soft-tissue]
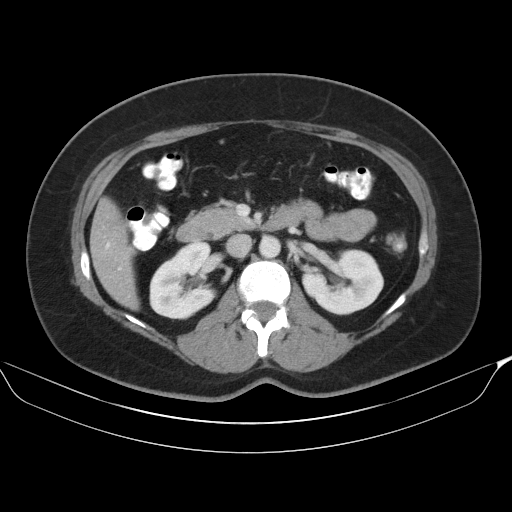
[im 63/98  bone]
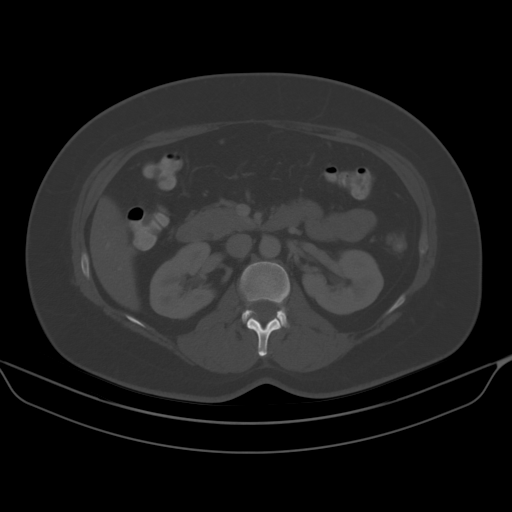
[im 70/98  soft-tissue]
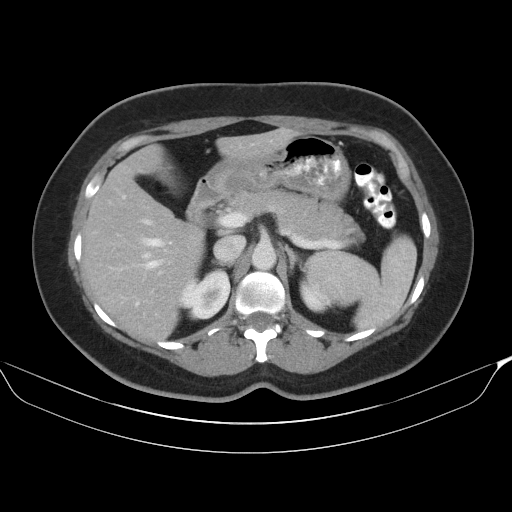
[im 70/98  lung]
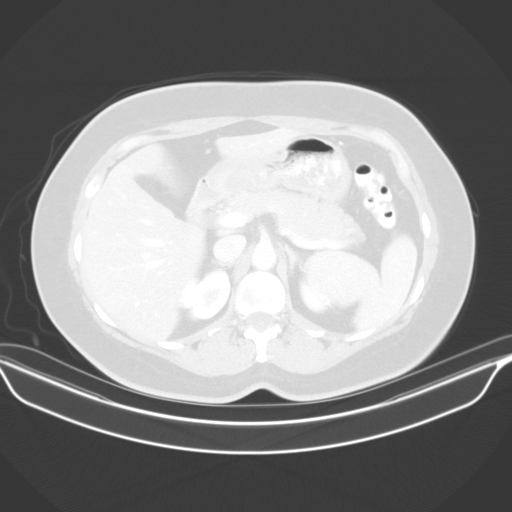
[im 77/98  soft-tissue]
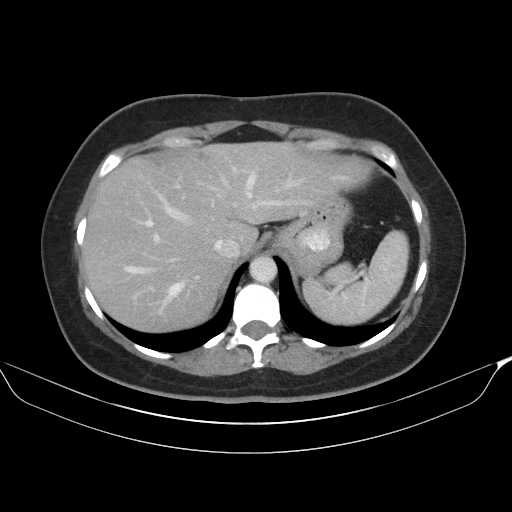
[im 77/98  lung]
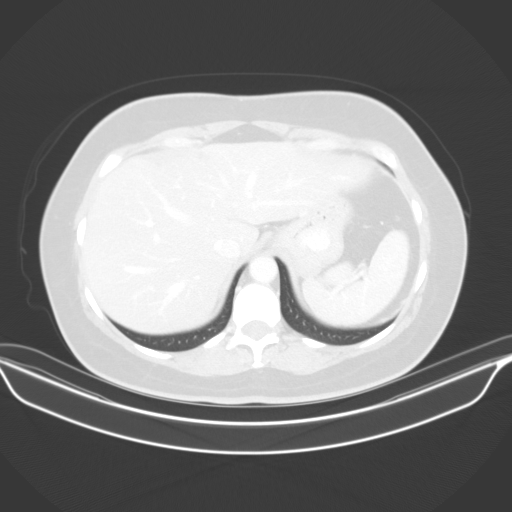
[im 84/98  soft-tissue]
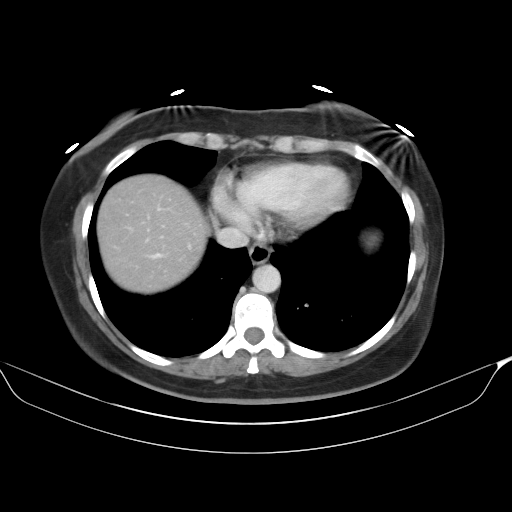
[im 84/98  lung]
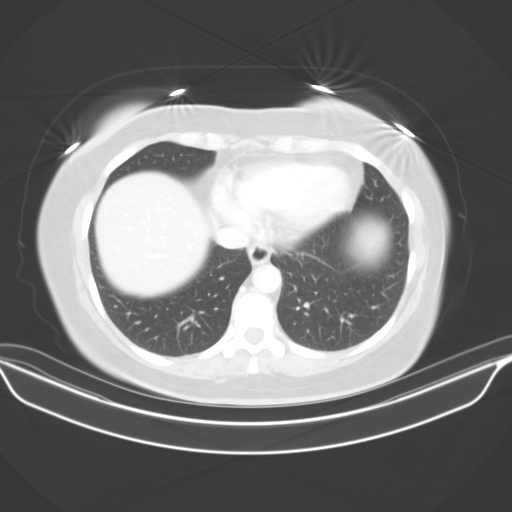
[im 91/98  soft-tissue]
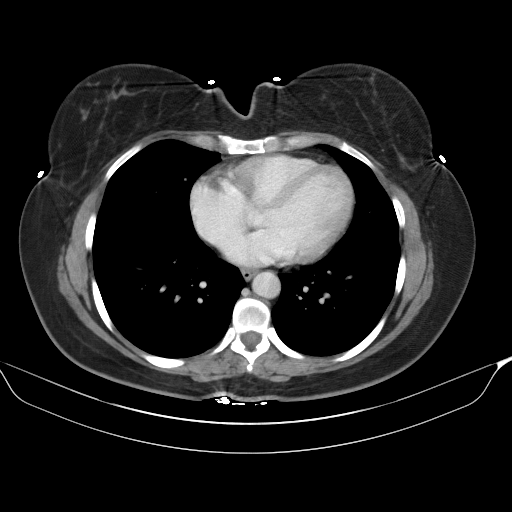
[im 91/98  lung]
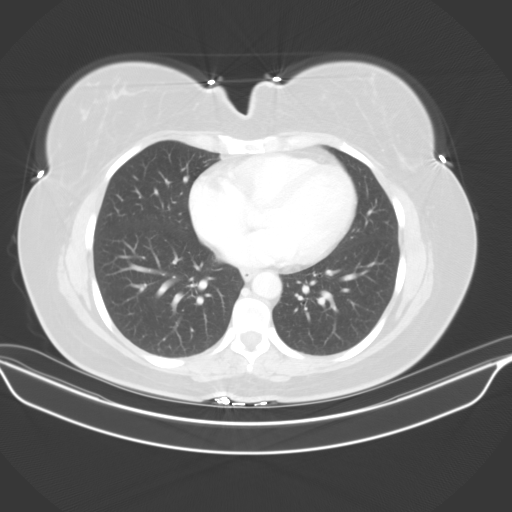

[13 of 32 positions shown; findings below may reference images not displayed]

FINDINGS: Lower chest: Lung bases are clear.

Hepatobiliary: Subcentimeter hepatic cysts, benign. Focal
fat/altered perfusion along the falciform ligament.

Status post cholecystectomy. No intrahepatic or extrahepatic ductal
dilatation.

Pancreas: Within normal limits.

Spleen: Within limits.

Adrenals/Urinary Tract: Adrenal glands are within normal limits.

Kidneys are normal limits.  No hydronephrosis.

Mildly thick-walled bladder anteriorly, although underdistended.

Stomach/Bowel: Stomach within normal limits.

No evidence of bowel obstruction.

Normal appendix (series 2/image 66).

No colonic wall thickening or inflammatory changes.

Vascular/Lymphatic: No evidence of abdominal aortic aneurysm.

No suspicious abdominopelvic lymphadenopathy.

Reproductive: Heterogeneous uterus, suggesting uterine fibroids
and/or adenomyosis.

Bilateral ovaries are within normal limits.

Other: No abdominopelvic ascites.

Musculoskeletal: Visualized osseous structures are within normal
limits.
IMPRESSION: Normal appendix. No CT findings to account for the patient's right
lower quadrant abdominal pain.

Status post cholecystectomy.

Heterogeneous uterus, suggesting uterine fibroids and/or
adenomyosis.

## 2023-02-07 IMAGING — MG MM DIGITAL DIAGNOSTIC UNILAT*R* W/ TOMO W/ CAD
4 series · 4 of 12 positions shown · non-contrast
Comparison: Previous exam(s).

CLINICAL DATA: Short-term follow-up for a probably benign area of
asymmetry the upper right breast. Patient had an area asymmetry in
this same location evaluated in May 2018, with the asymmetry
dispersing on spot compression imaging consistent superimposed
normal tissue. There was no sonographic correlate. This area
appeared mass-like on a screening study from 01/10/2020. Follow-up
diagnostic imaging demonstrated this area to again appear to be
superimposed fibroglandular tissue with no sonographic correlate.
Short-term follow-up was recommended. Patient has no current breast
complaints.

EXAM:
DIGITAL DIAGNOSTIC UNILATERAL RIGHT MAMMOGRAM WITH TOMOSYNTHESIS AND
CAD
TECHNIQUE: Right digital diagnostic mammography and breast tomosynthesis was
performed. The images were evaluated with computer-aided detection.

[R MLO synth-2D]
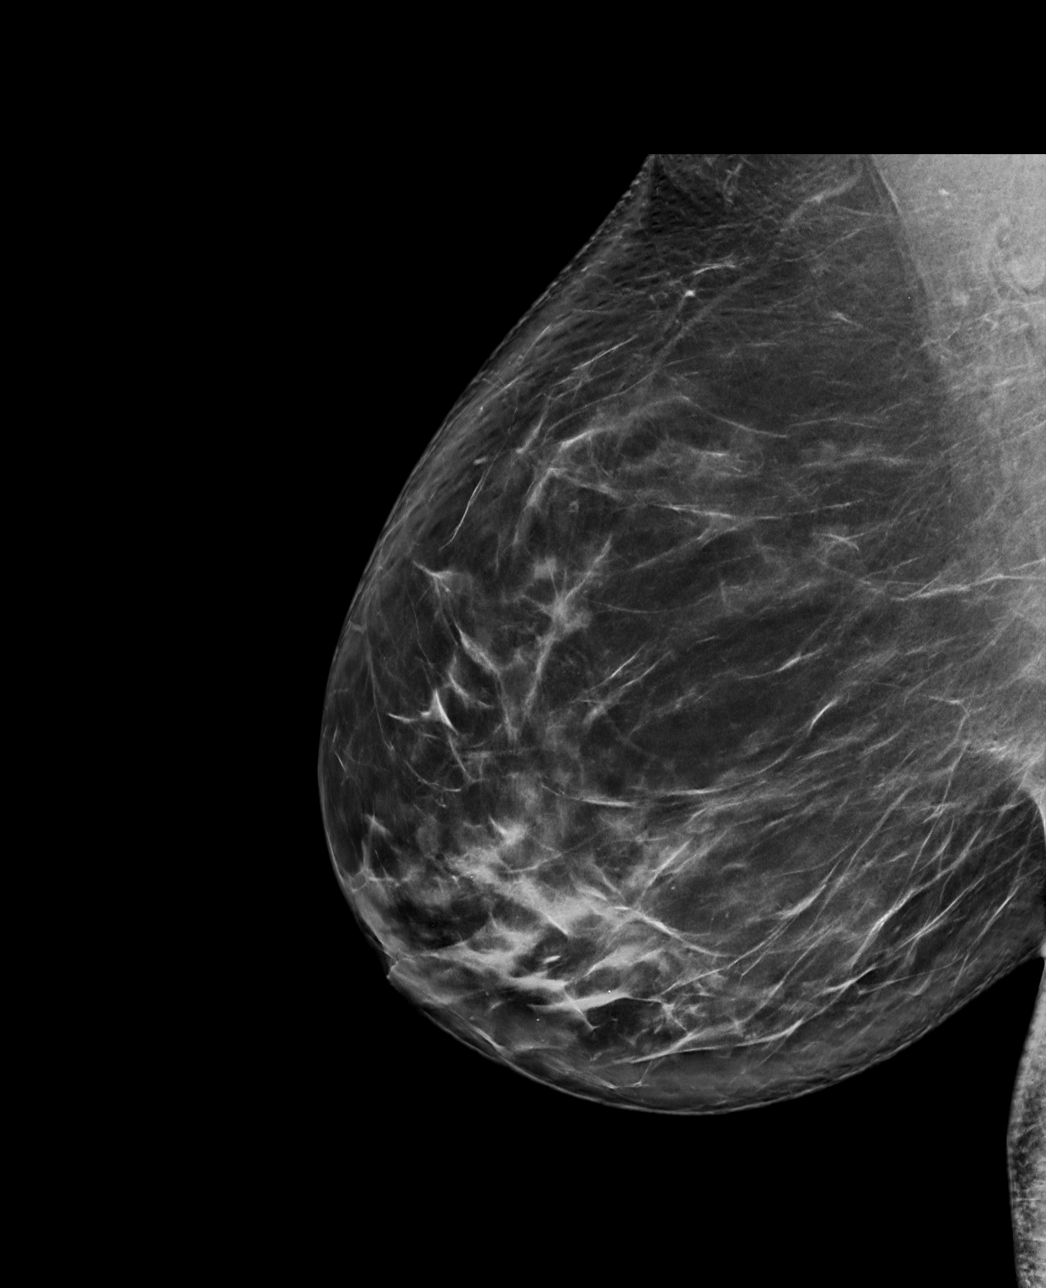

[R CC synth-2D]
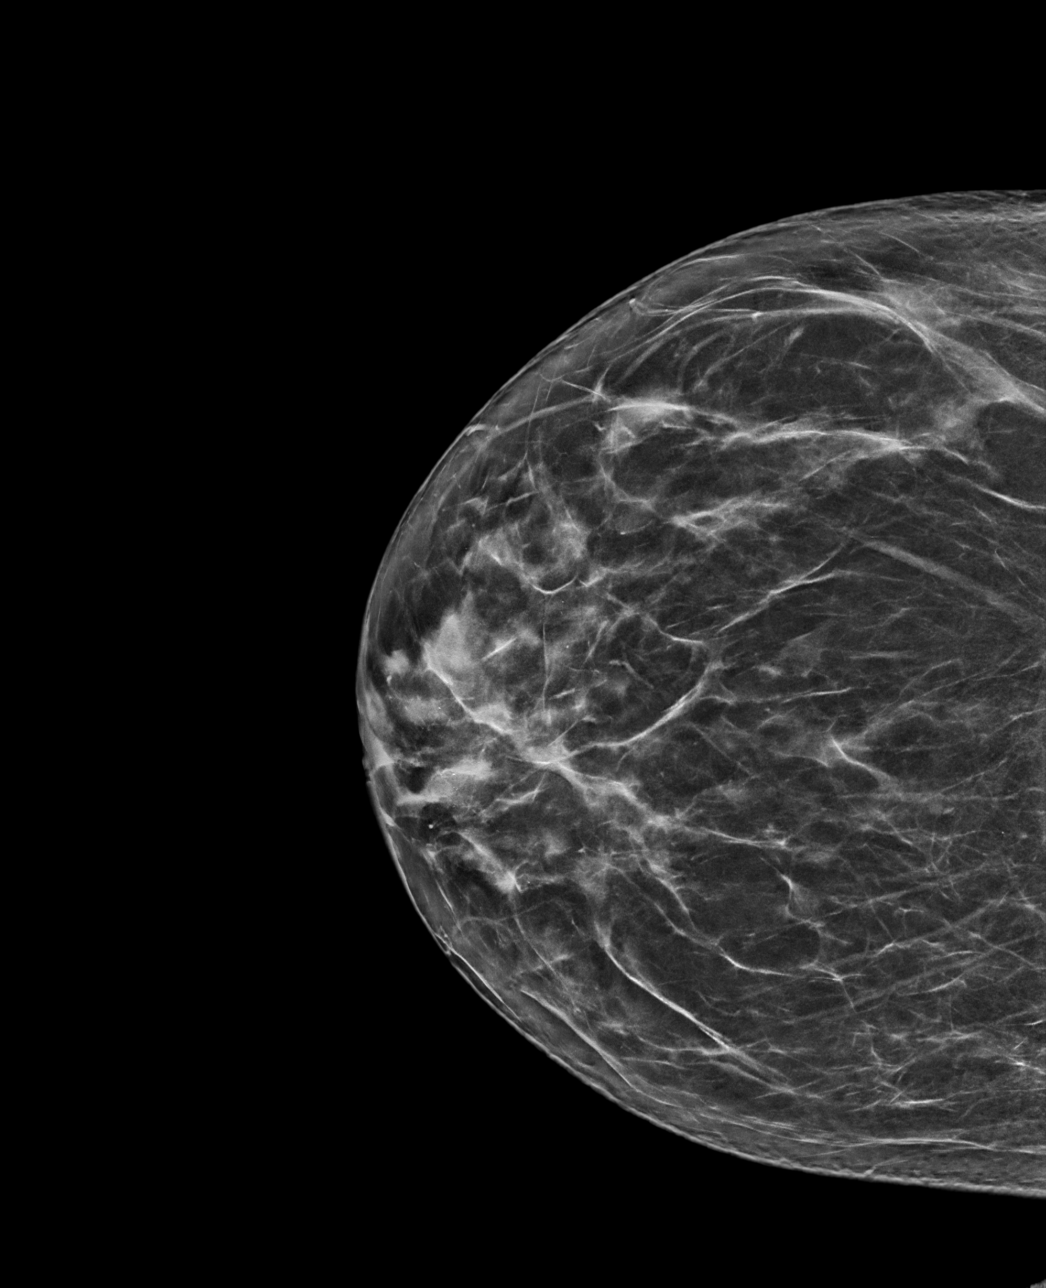

[R MLO tomo · tomo slice 48/95.0]
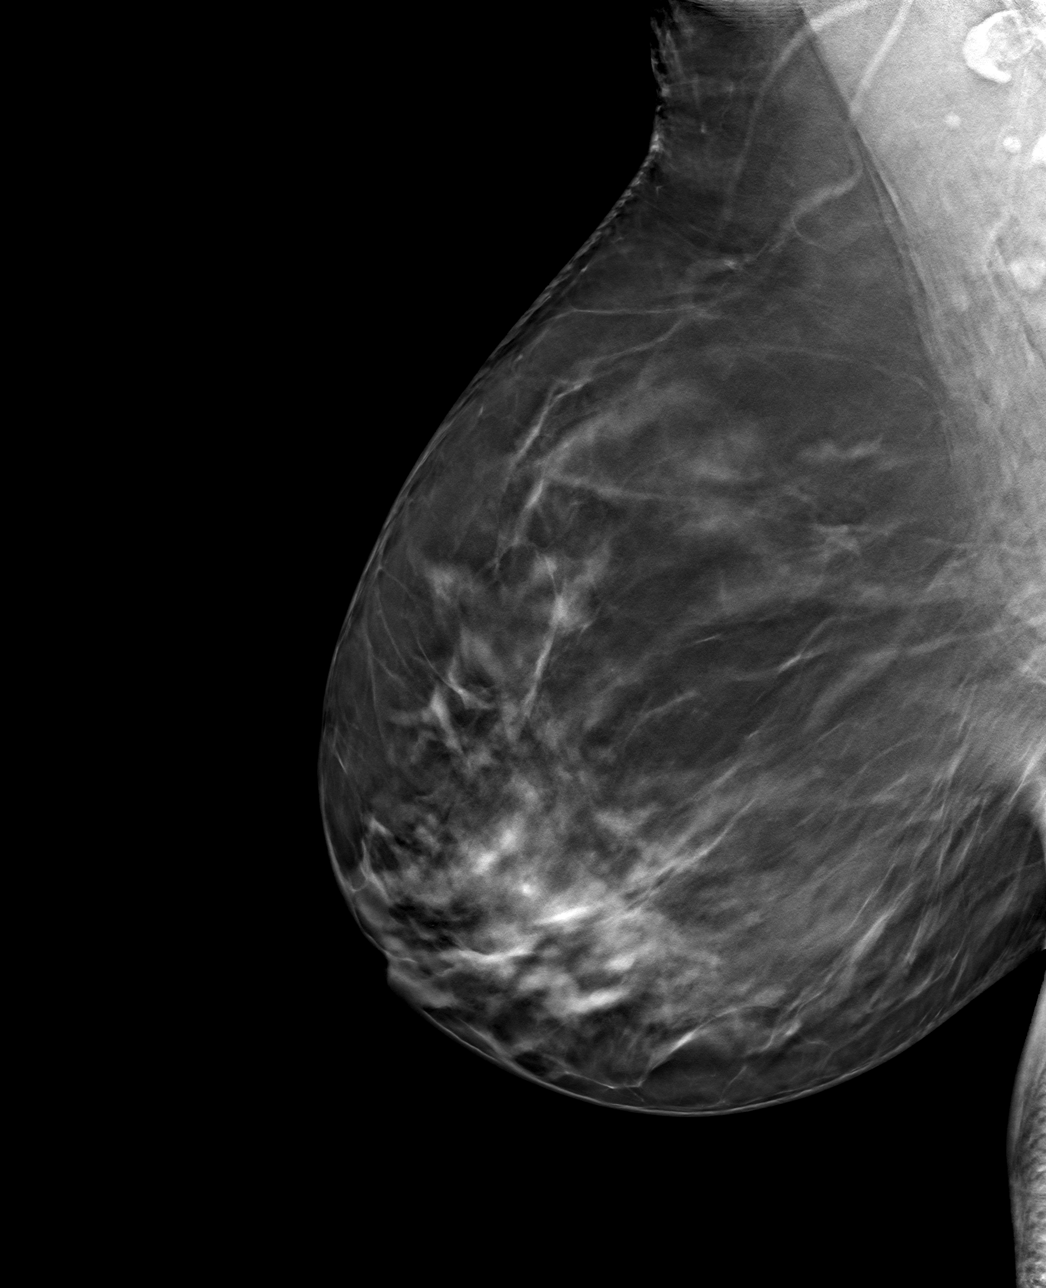

[R CC tomo · tomo slice 38/75.0]
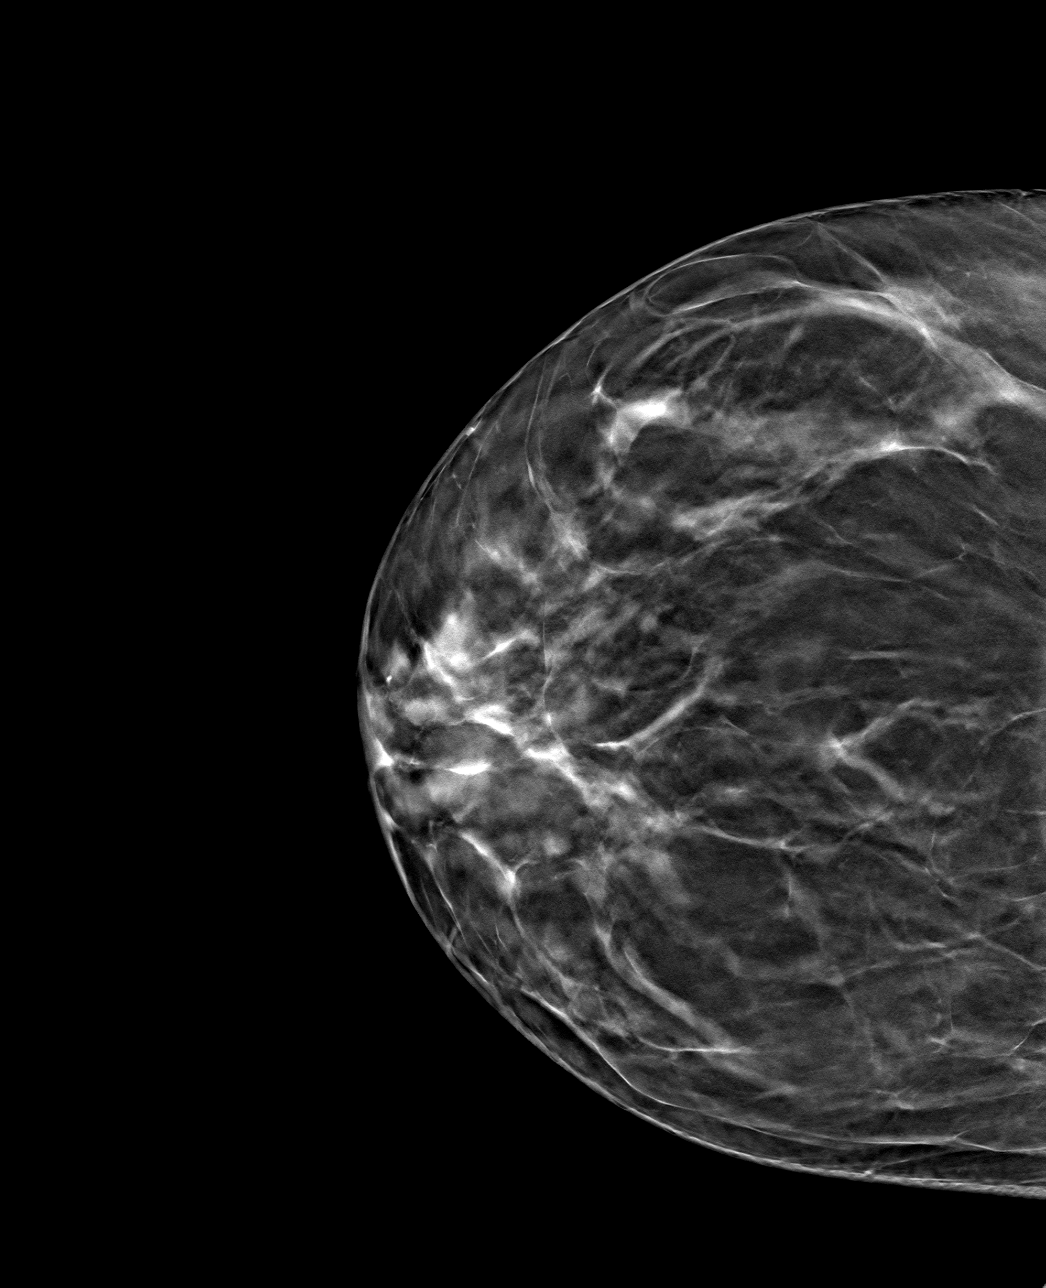

[4 of 12 positions shown; findings below may reference images not displayed]

ACR Breast Density Category c: The breast tissue is heterogeneously
dense, which may obscure small masses.
FINDINGS: The area of asymmetry noted prior exam now appears as more dispersed
fibroglandular tissue, similar to the appearance on 06/01/2018.
There are no defined masses or new or significant areas of
asymmetry. There are no areas of architectural distortion and no
suspicious calcifications.
IMPRESSION: 1. No evidence of breast malignancy.

RECOMMENDATION:
Return to annual screening mammography, last screening study
performed on 01/10/2020.

I have discussed the findings and recommendations with the patient.
If applicable, a reminder letter will be sent to the patient
regarding the next appointment.

BI-RADS CATEGORY  1: Negative.

## 2023-02-22 ENCOUNTER — Ambulatory Visit: Payer: 59 | Admitting: Adult Health

## 2023-02-22 ENCOUNTER — Encounter: Payer: Self-pay | Admitting: Adult Health

## 2023-02-22 VITALS — BP 138/85 | HR 74 | Ht 63.0 in | Wt 187.4 lb

## 2023-02-22 DIAGNOSIS — G4733 Obstructive sleep apnea (adult) (pediatric): Secondary | ICD-10-CM

## 2023-02-22 NOTE — Progress Notes (Signed)
[facility] [address] [place]. [place] [address] [phone]       OFFICE FOLLOW UP NOTE  Ms. Sadee Ruppe Date of Birth:  1976/01/21 Medical Record Number:  [id number]    Primary neurologist: Dr. Vickey Huger Reason for visit: Initial CPAP follow-up    SUBJECTIVE:   CHIEF COMPLAINT:  Chief Complaint  Patient presents with   Follow-up    Patient in room #3 and alone. Patient states she doing well but she having trouble with her mask at night.    Follow-up visit:  Brief HPI:   Coralee Fedewa is a 47 y.o. female who was evaluated by Dr. Vickey Huger on 08/24/2022 with prior history of sleep apnea per sleep study in 2016 by Dr. Toni Arthurs with total AHI of 13/h but intolerant to CPAP therapy, using mouthguard for the last 4 to 5 years.  ESS 10/24.  Repeat HST 09/13/2022 with mild OSA and REM sleep dependent sleep apnea with total AHI 10/h and REM AHI 17.5/h.  Recommended retrial of AutoPap as well as weight loss and avoiding supine sleep.  AutoPap initiated 12/15/2022.    Interval history:  CPAP compliance report shows good usage and optimal residual AHI. Reports tolerating oro-nasal mask well but bottom of mask seal continues to rip, she has had to replace mask multiple times. She does note improvement of sleep quality but no significant improvement of daytime energy levels. She can still wake up in the middle the night to use the restroom but typically only once, she usually will not replace mask due to sweating but know she does not sleep as well without CPAP. She typically gets about 7 hours of sleep.  At times she can feel as if she is fighting against the air to breath which will wake her up but this is only happened a couple of times.  ESS 9/24.          ROS:   14 system review of systems performed and negative with exception of those listed in HPI  PMH:  Past Medical History:  Diagnosis Date   Anxiety    Chronic cholecystitis    with stones    Diverticulosis of colon 02/14/2019   Fatty liver 02/14/2019   Per CT 02/2019   GERD (gastroesophageal reflux disease)    Headache    migraines   History of kidney stones    HSV-2 infection    Hypertension    Mild obstructive sleep apnea    study 07-12-2014 in epic,  mild osa w/ hypopnea AHI 13/hr--- 08-25-2017 per pt uses dental device   Pelvic pain in female    PONV (postoperative nausea and vomiting)    Wears glasses     PSH:  Past Surgical History:  Procedure Laterality Date   BREAST CYST EXCISION Left    2016?   CHOLECYSTECTOMY N/A 08/01/2019   Procedure: LAPAROSCOPIC CHOLECYSTECTOMY;  Surgeon: Almond Lint, MD;  Location: [facility];  Service: General;  Laterality: N/A;   DILATION AND CURETTAGE OF UTERUS  age 41   w/ suction for miscarriage   ENDOMETRIAL ABLATION N/A 09/01/2017   Procedure: ENDOMETRIAL ABLATION;  Surgeon: Lavina Hamman, MD;  Location: [facility];  Service: Gynecology;  Laterality: N/A;  USING MINERVA   ESSURE TUBAL LIGATION Bilateral Jan or Feb 2012   dr meisinger office   LAPAROSCOPIC BILATERAL SALPINGECTOMY Bilateral 09/01/2017   Procedure: LAPAROSCOPIC BILATERAL SALPINGECTOMY;  Surgeon: Lavina Hamman, MD;  Location: [facility];  Service: Gynecology;  Laterality: Bilateral;  DR request RNFA   MASS EXCISION Left 01/14/2016   Procedure: EXCISION OF LEFT BREAST MASS;  Surgeon: Almond Lint, MD;  Location: [facility] OR;  Service: General;  Laterality: Left;   WISDOM TOOTH EXTRACTION      Social History:  Social History   Socioeconomic History   Marital status: Married    Spouse name: Not on file   Number of children: 3   Years of education: Not on file   Highest education level: Not on file  Occupational History   Not on file  Tobacco Use   Smoking status: Never   Smokeless tobacco: Never  Vaping Use   Vaping status: Never Used  Substance and Sexual Activity   Alcohol use: Not Currently    Comment: rare   Drug use: No    Sexual activity: Not Currently    Partners: Male    Birth control/protection: None  Other Topics Concern   Not on file  Social History Narrative   Not on file   Social Determinants of Health   Financial Resource Strain: Not on file  Food Insecurity: Not on file  Transportation Needs: Not on file  Physical Activity: Not on file  Stress: Not on file  Social Connections: Not on file  Intimate Partner Violence: Not on file    Family History:  Family History  Problem Relation Age of Onset   Hypertension Mother    Hypertension Father    Heart disease Father    Alcohol abuse Father    Hyperlipidemia Father    Stroke Paternal Uncle    Diabetes Paternal Uncle    Heart disease Maternal Grandmother    Breast cancer Paternal Aunt    Breast cancer Paternal Aunt    Cancer Paternal Grandmother        abdominal - unsure what    Autism Daughter    Anxiety disorder Daughter     Medications:   Current Outpatient Medications on File Prior to Visit  Medication Sig Dispense Refill   ALPRAZolam (XANAX) 0.25 MG tablet TAKE 1/2 TO 1 TABLET ONCE OR TWICE DAILY AS NEEDED FOR ANXIETY ATTACKS 60 tablet 0   amLODipine (NORVASC) 5 MG tablet Take 1 tablet (5 mg total) by mouth daily. 30 tablet 11   buPROPion (WELLBUTRIN XL) 150 MG 24 hr tablet TAKE 1 TABLET EVERY MORNING FOR MOOD, FOCUS AND CONCENTRATION *INS ONLY COVERS 30DS* 90 tablet 4   Cholecalciferol (VITAMIN D) 125 MCG (5000 UT) CAPS Take 5,000 Units by mouth daily.     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 90 tablet 3   losartan-hydrochlorothiazide (HYZAAR) 50-12.5 MG tablet TAKE 1 TABLET BY MOUTH EVERY DAY 30 tablet 11   pantoprazole (PROTONIX) 40 MG tablet TAKE 1 TABLET BY MOUTH TWICE A DAY 180 tablet 3   Rimegepant Sulfate (NURTEC) 75 MG TBDP 1 tab at onset of headache, can redose in 1 hour if headache is  not resolved no more than 2 in 24 hours 8 tablet 2   Semaglutide-Weight Management (WEGOVY) 0.5 MG/0.5ML SOAJ Inject 0.5 mg into the skin once a week. 2 mL 2   topiramate (TOPAMAX) 50 MG tablet TAKE 1 TABLET BY MOUTH TWICE A DAY 60 tablet 2   No current facility-administered medications on file prior to visit.    Allergies:   Allergies  Allergen Reactions   Codeine Nausea And Vomiting   Azithromycin Nausea And Vomiting   Chlorhexidine Gluconate Rash    CHG      OBJECTIVE:  Physical Exam  Vitals:   02/22/23 0939  BP: 138/85  Pulse: 74  Weight: 187 lb 6.4 oz (85 kg)  Height: 5\' 3"  (1.6 m)   Body mass index is 33.2 kg/m. No results found.   General: well developed, well nourished, very pleasant middle-age female, seated, in no evident distress  Neurologic Exam Mental Status: Awake and fully alert. Oriented to place and time. Recent and remote memory intact. Attention span, concentration and fund of knowledge appropriate. Mood and affect appropriate.  Cranial Nerves: Pupils equal, briskly reactive to light. Extraocular movements full without nystagmus. Visual fields full to confrontation. Hearing intact. Facial sensation intact. Face, tongue, palate moves normally and symmetrically.  Motor: Normal bulk and tone. Normal strength in all tested extremity muscles Gait and Station: Arises from chair without difficulty. Stance is normal. Gait demonstrates normal stride length and balance without use of AD. Tandem walk and heel toe without difficulty.  Reflexes: 1+ and symmetric. Toes downgoing.         ASSESSMENT/PLAN: Nadyah Filipiak is a 47 y.o. year old female    OSA on CPAP : Compliance report shows satisfactory usage with optimal residual AHI.  Continue current pressure settings.  Will send order for mask refitting to DME.  Discussed continued nightly usage with ensuring greater than 4 hours nightly although prefer to use throughout the entire duration of sleep.   Continue to follow with DME company for any needed supplies or CPAP related concerns     Follow up in 6 months or call earlier if needed   CC:  PCP: Lucky Cowboy, MD    I spent 25 minutes of face-to-face and non-face-to-face time with patient.  This included previsit chart review, lab review, study review, order entry, electronic health record documentation, patient education and discussion regarding above diagnoses and treatment plan and answered all other questions to patient's satisfaction  Ihor Austin, [facility]  [facility] [address] [place], [place] [address]  Phone [phone] Fax [phone] Note: This document was prepared with digital dictation and possible smart phrase technology. Any transcriptional errors that result from this process are unintentional.

## 2023-02-22 NOTE — Patient Instructions (Addendum)
Your Plan:  Continue nightly use of CPAP with ensuring greater than 4 hours per night for optimal benefit and per insurance requirements  Will place order to DME [facility] requesting mask refitting  Continue to follow with DME for any needs supplies or CPAP related concerns     Follow up in 6 months or call earlier if needed      Thank you for coming to see [place] at [facility]. I hope we have been able to provide you high quality care today.  You may receive a patient satisfaction survey over the next few weeks. We would appreciate your feedback and comments so that we may continue to improve ourselves and the health of our patients.

## 2023-02-23 ENCOUNTER — Ambulatory Visit: Payer: 59

## 2023-03-20 ENCOUNTER — Ambulatory Visit
Admission: RE | Admit: 2023-03-20 | Discharge: 2023-03-20 | Disposition: A | Discharge status: Home / Self Care | Payer: 59 | Source: Ambulatory Visit | Attending: Internal Medicine | Admitting: Internal Medicine

## 2023-03-20 DIAGNOSIS — Z1231 Encounter for screening mammogram for malignant neoplasm of breast: Secondary | ICD-10-CM

## 2023-03-22 ENCOUNTER — Encounter: Payer: Self-pay | Admitting: Nurse Practitioner

## 2023-03-22 DIAGNOSIS — E66811 Obesity, class 1: Secondary | ICD-10-CM

## 2023-03-23 MED ORDER — PHENTERMINE HCL 37.5 MG PO TABS: ORAL_TABLET | ORAL | 0 refills | Status: AC

## 2023-05-07 ENCOUNTER — Other Ambulatory Visit: Payer: Self-pay | Admitting: Nurse Practitioner

## 2023-05-07 DIAGNOSIS — R0989 Other specified symptoms and signs involving the circulatory and respiratory systems: Secondary | ICD-10-CM

## 2023-05-30 ENCOUNTER — Encounter: Payer: 59 | Admitting: Nurse Practitioner

## 2023-07-11 ENCOUNTER — Encounter: Payer: 59 | Admitting: Nurse Practitioner

## 2023-07-20 ENCOUNTER — Encounter: Payer: 59 | Admitting: Nurse Practitioner

## 2023-08-30 NOTE — Progress Notes (Signed)
 [facility] [address] [place]. [place] [address] [phone]       OFFICE FOLLOW UP NOTE  [name] Date of Birth:  [birth year] Medical Record Number:  [id number]    Primary neurologist: Dr. Albertina Hugger Reason for visit: CPAP follow-up  Virtual Visit via Video Note  I connected with Emily Nolan on 08/31/23 at  2:45 PM EDT by a video enabled telemedicine application and verified that I am speaking with the correct person using two identifiers.  Location: Patient: at home Provider: in office, [facility]   I discussed the limitations of evaluation and management by telemedicine and the availability of in person appointments. The patient expressed understanding and agreed to proceed.     SUBJECTIVE:   Follow-up visit:  Prior visit: 02/22/2023  Brief HPI:   Emily Nolan is a 48 y.o. female who was evaluated by Dr. Albertina Hugger on 08/24/2022 with prior history of sleep apnea per sleep study in 2016 by Dr. Abel Abelson with total AHI of 13/h but intolerant to CPAP therapy, using mouthguard for the last 4 to 5 years.  ESS 10/24.  Repeat HST 09/13/2022 with mild OSA and REM sleep dependent sleep apnea with total AHI 10/h and REM AHI 17.5/h.  Recommended retrial of AutoPap as well as weight loss and avoiding supine sleep.  AutoPap initiated 12/15/2022.  At prior visit, compliance report showed satisfactory usage and optimal residual AHI.  Noted improvement of sleep quality but no significant treatment of daytime energy levels.  Did note some difficulty with mask fitting and was referred back to DME for mask refitting.  ESS 9/24.     Interval history:  Returns for CPAP compliance visit via MyChart video visit. Has been having difficulty using consistently lately. Report she moved less than 1 month ago and has been having difficulty adjusting to using more than 4 hours per night, feels room is warmer and wakes up sweating. Will remove mask in middle of the  night. She does use an apple watch which shows she gets into a deeper sleep without CPAP but wakes more frequently. She does feel overall improvement of daytime energy levels and sleep quality with use.  She previously has tried an F30 mask but had difficulty with tearing the mask and managing leaks. She has never tried nasal pillow or cradle.          ROS:   14 system review of systems performed and negative with exception of those listed in HPI  PMH:  Past Medical History:  Diagnosis Date   Anxiety    Chronic cholecystitis    with stones   Diverticulosis of colon 02/14/2019   Fatty liver 02/14/2019   Per CT 02/2019   GERD (gastroesophageal reflux disease)    Headache    migraines   History of kidney stones    HSV-2 infection    Hypertension    Mild obstructive sleep apnea    study 07-12-2014 in epic,  mild osa w/ hypopnea AHI 13/hr--- 08-25-2017 per pt uses dental device   Pelvic pain in female    PONV (postoperative nausea and vomiting)    Wears glasses     PSH:  Past Surgical History:  Procedure Laterality Date   BREAST CYST EXCISION Left    2016?   CHOLECYSTECTOMY N/A 08/01/2019   Procedure: LAPAROSCOPIC CHOLECYSTECTOMY;  Surgeon: Lockie Rima, MD;  Location: [facility];  Service: General;  Laterality: N/A;   DILATION AND CURETTAGE OF UTERUS  age  16   w/ suction for miscarriage   ENDOMETRIAL ABLATION N/A 09/01/2017   Procedure: ENDOMETRIAL ABLATION;  Surgeon: Cyd Dowse, MD;  [facility];  Service: Gynecology;  Laterality: N/A;  USING MINERVA   ESSURE TUBAL LIGATION Bilateral Jan or Feb 2012   dr meisinger office   LAPAROSCOPIC BILATERAL SALPINGECTOMY Bilateral 09/01/2017   Procedure: LAPAROSCOPIC BILATERAL SALPINGECTOMY;  Surgeon: Cyd Dowse, MD;  Location: [facility];  Service: Gynecology;  Laterality: Bilateral;  DR request RNFA   MASS EXCISION Left 01/14/2016   Procedure: EXCISION OF LEFT BREAST MASS;  Surgeon: Lockie Rima, MD;  Location: [facility];  Service: General;  Laterality: Left;   WISDOM TOOTH EXTRACTION      Social History:  Social History   Socioeconomic History   Marital status: Married    Spouse name: Not on file   Number of children: 3   Years of education: Not on file   Highest education level: Not on file  Occupational History   Not on file  Tobacco Use   Smoking status: Never   Smokeless tobacco: Never  Vaping Use   Vaping status: Never Used  Substance and Sexual Activity   Alcohol use: Not Currently    Comment: rare   Drug use: No   Sexual activity: Not Currently    Partners: Male    Birth control/protection: None  Other Topics Concern   Not on file  Social History Narrative   Not on file   Social Drivers of Health   Financial Resource Strain: Not on file  Food Insecurity: Not on file  Transportation Needs: Not on file  Physical Activity: Not on file  Stress: Not on file  Social Connections: Not on file  Intimate Partner Violence: Not on file    Family History:  Family History  Problem Relation Age of Onset   Hypertension Mother    Hypertension Father    Heart disease Father    Alcohol abuse Father    Hyperlipidemia Father    Stroke Paternal Uncle    Diabetes Paternal Uncle    Heart disease Maternal Grandmother    Breast cancer Paternal Aunt    Breast cancer Paternal Aunt    Cancer Paternal Grandmother        abdominal - unsure what    Autism Daughter    Anxiety disorder Daughter     Medications:   Current Outpatient Medications on File Prior to Visit  Medication Sig Dispense Refill   ALPRAZolam  (XANAX ) 0.25 MG tablet TAKE 1/2 TO 1 TABLET ONCE OR TWICE DAILY AS NEEDED FOR ANXIETY ATTACKS 60 tablet 0   amLODipine  (NORVASC ) 5 MG tablet Take 1 tablet (5 mg total) by mouth daily. 30 tablet 11   buPROPion  (WELLBUTRIN  XL) 150 MG 24 hr tablet TAKE 1 TABLET EVERY MORNING FOR MOOD, FOCUS AND CONCENTRATION *INS ONLY COVERS 30DS* 90 tablet 4   Cholecalciferol  (VITAMIN D ) 125 MCG (5000 UT) CAPS Take 5,000 Units by mouth daily.     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 90 tablet 3   losartan -hydrochlorothiazide (HYZAAR) 50-12.5 MG tablet TAKE 1 TABLET BY MOUTH EVERY DAY 90 tablet 3   pantoprazole  (PROTONIX ) 40 MG tablet TAKE 1 TABLET BY MOUTH TWICE A DAY 180 tablet 3   phentermine  (ADIPEX-P ) 37.5 MG tablet TAKE 0.5 TO  1 TABLET BY MOUTH EVERY MORNING FOR DIETING AND WEIGHT LOSS 30 tablet 0   Rimegepant Sulfate (NURTEC) 75 MG TBDP 1 tab at onset of headache, can redose in 1 hour if headache is not resolved no more than 2 in 24 hours 8 tablet 2   Semaglutide -Weight Management (WEGOVY ) 0.5 MG/0.5ML SOAJ Inject 0.5 mg into the skin once a week. 2 mL 2   topiramate  (TOPAMAX ) 50 MG tablet TAKE 1 TABLET BY MOUTH TWICE A DAY 60 tablet 2   No current facility-administered medications on file prior to visit.    Allergies:   Allergies  Allergen Reactions   Codeine Nausea And Vomiting   Azithromycin Nausea And Vomiting   Chlorhexidine  Gluconate Rash    CHG      OBJECTIVE:  Physical Exam  General: well developed, well nourished, very pleasant middle-age female, seated, in no evident distress  Neurologic Exam Mental Status: Awake and fully alert. Oriented to place and time. Recent and remote memory intact. Attention span, concentration and fund of knowledge appropriate. Mood and affect appropriate.        ASSESSMENT/PLAN: Emily Nolan is a 48 y.o. year old female    OSA on CPAP :  Compliance report shows satisfactory nightly usage but poor >4 hr usage due to difficulty tolerating FFM, optimal residual AHI with use.  Recommend she f/u with DME to try nasal pillow or cradle for better tolerance.  Continue current pressure settings of 5-14 with EPR 3.   Discussed continued nightly usage with emphasizing  importance of using greater than 4 hours nightly although prefer to use throughout the entire duration of sleep.   Continue to follow with DME company for any needed supplies or CPAP related concerns     Follow up in 1 year via MyChart VV or call earlier if needed   CC:  PCP: No primary care provider on file.    I spent 15 minutes of face-to-face and non-face-to-face time with patient.  This included previsit chart review, lab review, study review, order entry, electronic health record documentation, patient education and discussion regarding above diagnoses and treatment plan and answered all other questions to patient's satisfaction  Emily Nolan, [facility]  [facility] [address] [place], [place] [address]  Phone [phone] Fax [phone] Note: This document was prepared with digital dictation and possible smart phrase technology. Any transcriptional errors that result from this process are unintentional.

## 2023-08-31 ENCOUNTER — Encounter: Payer: Self-pay | Admitting: Adult Health

## 2023-08-31 ENCOUNTER — Telehealth (INDEPENDENT_AMBULATORY_CARE_PROVIDER_SITE_OTHER): Payer: 59 | Admitting: Adult Health

## 2023-08-31 DIAGNOSIS — G4733 Obstructive sleep apnea (adult) (pediatric): Secondary | ICD-10-CM

## 2024-03-19 ENCOUNTER — Other Ambulatory Visit: Payer: Self-pay | Admitting: Obstetrics and Gynecology

## 2024-03-19 DIAGNOSIS — Z1231 Encounter for screening mammogram for malignant neoplasm of breast: Secondary | ICD-10-CM

## 2024-04-02 ENCOUNTER — Ambulatory Visit
Admission: RE | Admit: 2024-04-02 | Discharge: 2024-04-02 | Disposition: A | Discharge status: Home / Self Care | Source: Ambulatory Visit | Attending: Obstetrics and Gynecology | Admitting: Obstetrics and Gynecology

## 2024-04-02 DIAGNOSIS — Z1231 Encounter for screening mammogram for malignant neoplasm of breast: Secondary | ICD-10-CM

## 2024-05-24 ENCOUNTER — Other Ambulatory Visit (HOSPITAL_COMMUNITY)
